# Patient Record
Sex: Female | Born: 1985
Health system: Southern US, Community
[De-identification: ages and names within clinical notes are randomized; demographics above are authoritative.]

## PROBLEM LIST (undated history)

## (undated) DIAGNOSIS — D649 Anemia, unspecified: Secondary | ICD-10-CM

## (undated) DIAGNOSIS — K219 Gastro-esophageal reflux disease without esophagitis: Secondary | ICD-10-CM

## (undated) HISTORY — PX: OTHER SURGICAL HISTORY: SHX169

---
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
## 2007-01-02 ENCOUNTER — Inpatient Hospital Stay (HOSPITAL_COMMUNITY): Admission: AD | Admit: 2007-01-02 | Discharge: 2007-01-04 | Payer: Self-pay | Admitting: Obstetrics and Gynecology

## 2009-07-18 ENCOUNTER — Inpatient Hospital Stay (HOSPITAL_COMMUNITY): Admission: AD | Admit: 2009-07-18 | Discharge: 2009-07-20 | Payer: Self-pay | Admitting: Obstetrics and Gynecology

## 2009-07-18 ENCOUNTER — Inpatient Hospital Stay (HOSPITAL_COMMUNITY): Admission: AD | Admit: 2009-07-18 | Discharge: 2009-07-18 | Payer: Self-pay | Admitting: Obstetrics and Gynecology

## 2009-11-01 HISTORY — PX: KNEE SURGERY: SHX244

## 2011-02-05 LAB — CBC
HCT: 40.5 % (ref 36.0–46.0)
Hemoglobin: 11.8 g/dL — ABNORMAL LOW (ref 12.0–15.0)
Hemoglobin: 13.8 g/dL (ref 12.0–15.0)
MCHC: 34.1 g/dL (ref 30.0–36.0)
MCHC: 34.5 g/dL (ref 30.0–36.0)
MCV: 92.4 fL (ref 78.0–100.0)
Platelets: 138 10*3/uL — ABNORMAL LOW (ref 150–400)
RDW: 13.1 % (ref 11.5–15.5)
WBC: 12.8 10*3/uL — ABNORMAL HIGH (ref 4.0–10.5)

## 2011-03-19 NOTE — Discharge Summary (Signed)
Michele Houston, Michele Houston               ACCOUNT NO.:  [id number]   MEDICAL RECORD NO.:  [id number]          PATIENT TYPE:  INP   LOCATION:  9141                          FACILITY:  [facility]   PHYSICIAN:  Huel Cote, M.D. DATE OF BIRTH:  29-Nov-1985   DATE OF ADMISSION:  01/02/2007  DATE OF DISCHARGE:  01/04/2007                               DISCHARGE SUMMARY   DISCHARGE DIAGNOSES:  1. Term pregnancy at 37 weeks, delivered.  2. Status post normal spontaneous vaginal delivery.   DISCHARGE MEDICATIONS:  1. Motrin 600 mg p.o. every 6 hours.  2. Prenatal 1 p.o. daily.   DISCHARGE FOLLOWUP:  The patient is to follow up in the office in 6  weeks for routine postpartum exam.   HOSPITAL COURSE:  The patient is an 25 year old, G1, P0, who was  admitted at [redacted] weeks gestation with regular contractions and bloody  show. The patient had been seen multiple times over the 4 days prior to  admission with contractions and minimal cervical change; however, upon  this admission, she did reach 4-5 cm with regular contractions.  She was  admitted and received her epidural and continued to progress in labor  with Pitocin augmentation.  Prenatal care otherwise had been  uncomplicated.   PRENATAL LABORATORY DATA:  A+, antibody negative, RPR nonreactive,  rubella equivocal, hepatitis B surface antigen negative, HIV negative,  GC negative, chlamydia negative, 1-hour Glucola 146, 3-hour within  normal limits.   PAST MEDICAL HISTORY:  Dysthymia.   PAST SURGICAL HISTORY:  Wisdom tooth   ALLERGIES:  CECLOR.   MEDICATIONS:  None.   HOSPITAL COURSE:  On admission, she was afebrile with stable vital  signs.  Fetal heart rate was reactive.  Cervix after admission was found  to be 9 cm, completely effaced and zero station.  She had rupture of  membranes performed with clear fluid noted, and she was not placed on  group B strep prophylaxis as she had no risk factors, though her status  was unknown and  progressed very quickly, so did not ever need any  antibiotic prophylaxis.  She reached complete dilation and pushed well  with a normal spontaneous vaginal delivery of a vigorous female infant  over a small second-degree laceration.  Apgars were 9/9. Weight was 6  pounds 1 ounce.  Placenta delivered spontaneously and was handed off for  cord blood donation.  Estimated blood loss was 350 mL. Second-degree  laceration was repaired 2-0 Vicryl.  Cervix and rectum were intact.   She did well postpartum.  On postpartum day #1, her hemoglobin was 11.7.  By postpartum day #2, she was feeling quite well and had no complaints.  Pain was well controlled.  Fundus was firm and bleeding normal, and she  was felt stable for discharge home.      Huel Cote, M.D.  Electronically Signed     KR/MEDQ  D:  01/04/2007  T:  01/04/2007  Job:  [id number]

## 2013-11-25 ENCOUNTER — Emergency Department: Payer: Self-pay | Admitting: Emergency Medicine

## 2014-03-28 ENCOUNTER — Inpatient Hospital Stay: Payer: Self-pay | Admitting: Obstetrics & Gynecology

## 2014-03-29 LAB — CBC WITH DIFFERENTIAL/PLATELET
BASOS ABS: 0 10*3/uL (ref 0.0–0.1)
BASOS PCT: 0.1 %
EOS ABS: 0 10*3/uL (ref 0.0–0.7)
Eosinophil %: 0.3 %
HCT: 35.9 % (ref 35.0–47.0)
HGB: 12 g/dL (ref 12.0–16.0)
Lymphocyte #: 1.3 10*3/uL (ref 1.0–3.6)
Lymphocyte %: 12.6 %
MCH: 29.3 pg (ref 26.0–34.0)
MCHC: 33.4 g/dL (ref 32.0–36.0)
MCV: 88 fL (ref 80–100)
Monocyte #: 0.5 x10 3/mm (ref 0.2–0.9)
Monocyte %: 5.4 %
Neutrophil #: 8.3 10*3/uL — ABNORMAL HIGH (ref 1.4–6.5)
Neutrophil %: 81.6 %
Platelet: 180 10*3/uL (ref 150–440)
RBC: 4.1 10*6/uL (ref 3.80–5.20)
RDW: 14 % (ref 11.5–14.5)
WBC: 10.2 10*3/uL (ref 3.6–11.0)

## 2014-03-30 LAB — HEMATOCRIT: HCT: 34.8 % — ABNORMAL LOW (ref 35.0–47.0)

## 2015-03-11 NOTE — H&P (Signed)
L&D Evaluation:  History Expanded:  HPI 29yo [id number] who presnts in labor with contractions every 4-7 minutes. She has had two SVD beofre and has had fairly rapid-7 hour labors where the contractions were not close but change was made. SHe is GBS pos. She made change from 1-3 at office and now as gone to 4. She will be kept and AMp started. AROm after second dose. HIstory of PPD, POS antiM antibody, not present at 37 degrees. Resolved low lying placenta False pos RPR with TPA neg repeat at 36 weeks is neg. VI/RI/Apos tDAP on 4/2, BF desires minipill.   Gravida 3   Term 2   PreTerm 0   Abortion 0   Living 2   Blood Type (Maternal) A positive   Group B Strep Results Maternal (Result >5wks must be treated as unknown) positive    Maternal HIV Negative   Maternal Syphilis Ab Nonreactive   Maternal Varicella Immune   Rubella Results (Maternal) immune   Maternal T-Dap Immune   [facility]   Presents with contractions   Patient's Medical History No Chronic Illness    Patient's Surgical History none    Medications Pre Natal Vitamins    Allergies NKDA   Social History none    Family History Non-Contributory    ROS:  ROS All systems were reviewed.  HEENT, CNS, GI, GU, Respiratory, CV, Renal and Musculoskeletal systems were found to be normal.   Exam:  Vital Signs stable    Urine Protein not completed   General no apparent distress   Mental Status clear    Chest clear    Heart normal sinus rhythm   Abdomen gravid, tender with contractions   Estimated Fetal Weight Average for gestational age   Fetal Position v   Fundal Height term   Back no CVAT   Edema no edema    Pelvic no external lesions   Mebranes Intact   FHT normal rate with no decels, CAT 1   Ucx irregular   Ucx Frequency 4 min   Skin dry   Lymph no lymphadenopathy    Impression:  Impression active labor   Plan:  Plan antibiotics for GBBS prophylaxis   Comments AROM when  after two doses   Follow Up Appointment need to schedule. in 6 weeks   Electronic Signatures: Adria DevonKlett, Baylen Buckner (MD)  (Signed 29-May-15 08:38)  Authored: L&D Evaluation   Last Updated: 29-May-15 08:38 by Adria DevonKlett, Kyal Arts (MD)

## 2015-05-25 ENCOUNTER — Emergency Department
Admission: EM | Admit: 2015-05-25 | Discharge: 2015-05-25 | Disposition: A | Discharge status: Home / Self Care | Payer: BLUE CROSS/BLUE SHIELD | Attending: Emergency Medicine | Admitting: Emergency Medicine

## 2015-05-25 ENCOUNTER — Emergency Department: Payer: BLUE CROSS/BLUE SHIELD

## 2015-05-25 ENCOUNTER — Other Ambulatory Visit: Payer: Self-pay

## 2015-05-25 ENCOUNTER — Encounter: Payer: Self-pay | Admitting: Emergency Medicine

## 2015-05-25 DIAGNOSIS — R1011 Right upper quadrant pain: Secondary | ICD-10-CM

## 2015-05-25 DIAGNOSIS — R1010 Upper abdominal pain, unspecified: Secondary | ICD-10-CM | POA: Diagnosis present

## 2015-05-25 DIAGNOSIS — K279 Peptic ulcer, site unspecified, unspecified as acute or chronic, without hemorrhage or perforation: Secondary | ICD-10-CM | POA: Insufficient documentation

## 2015-05-25 LAB — COMPREHENSIVE METABOLIC PANEL
ALT: 21 U/L (ref 14–54)
ANION GAP: 12 (ref 5–15)
AST: 22 U/L (ref 15–41)
Albumin: 4.7 g/dL (ref 3.5–5.0)
Alkaline Phosphatase: 56 U/L (ref 38–126)
BILIRUBIN TOTAL: 0.6 mg/dL (ref 0.3–1.2)
BUN: 13 mg/dL (ref 6–20)
CALCIUM: 9.3 mg/dL (ref 8.9–10.3)
CO2: 24 mmol/L (ref 22–32)
CREATININE: 0.93 mg/dL (ref 0.44–1.00)
Chloride: 104 mmol/L (ref 101–111)
GLUCOSE: 89 mg/dL (ref 65–99)
POTASSIUM: 3.5 mmol/L (ref 3.5–5.1)
Sodium: 140 mmol/L (ref 135–145)
TOTAL PROTEIN: 7.2 g/dL (ref 6.5–8.1)

## 2015-05-25 LAB — URINALYSIS COMPLETE WITH MICROSCOPIC (ARMC ONLY)
BACTERIA UA: NONE SEEN
Bilirubin Urine: NEGATIVE
Glucose, UA: NEGATIVE mg/dL
HGB URINE DIPSTICK: NEGATIVE
Leukocytes, UA: NEGATIVE
NITRITE: NEGATIVE
Protein, ur: NEGATIVE mg/dL
SPECIFIC GRAVITY, URINE: 1.021 (ref 1.005–1.030)
pH: 8 (ref 5.0–8.0)

## 2015-05-25 LAB — CBC
HEMATOCRIT: 41.9 % (ref 35.0–47.0)
Hemoglobin: 14.1 g/dL (ref 12.0–16.0)
MCH: 28.6 pg (ref 26.0–34.0)
MCHC: 33.6 g/dL (ref 32.0–36.0)
MCV: 85.1 fL (ref 80.0–100.0)
PLATELETS: 267 10*3/uL (ref 150–440)
RBC: 4.93 MIL/uL (ref 3.80–5.20)
RDW: 13.7 % (ref 11.5–14.5)
WBC: 6.6 10*3/uL (ref 3.6–11.0)

## 2015-05-25 LAB — LIPASE, BLOOD: LIPASE: 24 U/L (ref 22–51)

## 2015-05-25 MED ORDER — SUCRALFATE 1 G PO TABS
1.0000 g | ORAL_TABLET | Freq: Once | ORAL | Status: AC
  Administered 2015-05-25: 1 g via ORAL
  Filled 2015-05-25: qty 1

## 2015-05-25 MED ORDER — GI COCKTAIL ~~LOC~~
30.0000 mL | Freq: Once | ORAL | Status: AC
  Administered 2015-05-25: 30 mL via ORAL
  Filled 2015-05-25: qty 30

## 2015-05-25 MED ORDER — PANTOPRAZOLE SODIUM 40 MG PO TBEC: 40.0000 mg | DELAYED_RELEASE_TABLET | Freq: Every day | ORAL | Status: DC

## 2015-05-25 MED ORDER — SUCRALFATE 1 G PO TABS: 1.0000 g | ORAL_TABLET | Freq: Four times a day (QID) | ORAL | Status: DC

## 2015-05-25 MED ORDER — PANTOPRAZOLE SODIUM 40 MG PO TBEC
40.0000 mg | DELAYED_RELEASE_TABLET | Freq: Once | ORAL | Status: AC
  Administered 2015-05-25: 40 mg via ORAL
  Filled 2015-05-25: qty 1

## 2015-05-25 NOTE — ED Notes (Addendum)
Pt here RUQ abdominal pain.  PT was seen at [facility] ED on Wednesday and states the MD felt that she may have gallstones. Pt here today for increased pain. Pt wanting ultrasound.  Pt also reports feeling some intermittent chest pain and difficulty taking a breath.

## 2015-05-25 NOTE — Discharge Instructions (Signed)
Peptic Ulcer A peptic ulcer is a sore in the lining of your esophagus (esophageal ulcer), stomach (gastric ulcer), or in the first part of your small intestine (duodenal ulcer). The ulcer causes erosion into the deeper tissue. CAUSES  Normally, the lining of the stomach and the small intestine protects itself from the acid that digests food. The protective lining can be damaged by:  An infection caused by a bacterium called Helicobacter pylori (H. pylori).  Regular use of nonsteroidal anti-inflammatory drugs (NSAIDs), such as ibuprofen or aspirin.  Smoking tobacco. Other risk factors include being older than 50, drinking alcohol excessively, and having a family history of ulcer disease.  SYMPTOMS   Burning pain or gnawing in the area between the chest and the belly button.  Heartburn.  Nausea and vomiting.  Bloating. The pain can be worse on an empty stomach and at night. If the ulcer results in bleeding, it can cause:  Black, tarry stools.  Vomiting of bright red blood.  Vomiting of coffee-ground-looking materials. DIAGNOSIS  A diagnosis is usually made based upon your history and an exam. Other tests and procedures may be performed to find the cause of the ulcer. Finding a cause will help determine the best treatment. Tests and procedures may include:  Blood tests, stool tests, or breath tests to check for the bacterium H. pylori.  An upper gastrointestinal (GI) series of the esophagus, stomach, and small intestine.  An endoscopy to examine the esophagus, stomach, and small intestine.  A biopsy. TREATMENT  Treatment may include:  Eliminating the cause of the ulcer, such as smoking, NSAIDs, or alcohol.  Medicines to reduce the amount of acid in your digestive tract.  Antibiotic medicines if the ulcer is caused by the H. pylori bacterium.  An upper endoscopy to treat a bleeding ulcer.  Surgery if the bleeding is severe or if the ulcer created a hole somewhere in the  digestive system. HOME CARE INSTRUCTIONS   Avoid tobacco, alcohol, and caffeine. Smoking can increase the acid in the stomach, and continued smoking will impair the healing of ulcers.  Avoid foods and drinks that seem to cause discomfort or aggravate your ulcer.  Only take medicines as directed by your caregiver. Do not substitute over-the-counter medicines for prescription medicines without talking to your caregiver.  Keep any follow-up appointments and tests as directed. SEEK MEDICAL CARE IF:   Your do not improve within 7 days of starting treatment.  You have ongoing indigestion or heartburn. SEEK IMMEDIATE MEDICAL CARE IF:   You have sudden, sharp, or persistent abdominal pain.  You have bloody or dark black, tarry stools.  You vomit blood or vomit that looks like coffee grounds.  You become light-headed, weak, or feel faint.  You become sweaty or clammy. MAKE SURE YOU:   Understand these instructions.  Will watch your condition.  Will get help right away if you are not doing well or get worse. Document Released: 10/15/2000 Document Revised: 03/04/2014 Document Reviewed: 05/17/2012 [facility] Patient Information 2015 [place], [place]. This information is not intended to replace advice given to you by your health care provider. Make sure you discuss any questions you have with your health care provider. Heartburn Heartburn is a painful, burning sensation in the chest. It may feel worse in certain positions, such as lying down or bending over. It is caused by stomach acid backing up into the tube that carries food from the mouth down to the stomach (lower esophagus).  CAUSES   Large meals.  Certain foods and drinks.  Exercise.  Increased acid production.  Being overweight or obese.  Certain medicines. SYMPTOMS   Burning pain in the chest or lower throat.  Bitter taste in the mouth.  Coughing. DIAGNOSIS  If the usual treatments for heartburn do not improve your  symptoms, then tests may be done to see if there is another condition present. Possible tests may include:  X-rays.  Endoscopy. This is when a tube with a light and a camera on the end is used to examine the esophagus and the stomach.  A test to measure the amount of acid in the esophagus (pH test).  A test to see if the esophagus is working properly (esophageal manometry).  Blood, breath, or stool tests to check for bacteria that cause ulcers. TREATMENT   Your caregiver may tell you to use certain over-the-counter medicines (antacids, acid reducers) for mild heartburn.  Your caregiver may prescribe medicines to decrease the acid in your stomach or protect your stomach lining.  Your caregiver may recommend certain diet changes.  For severe cases, your caregiver may recommend that the head of your bed be elevated on blocks. (Sleeping with more pillows is not an effective treatment as it only changes the position of your head and does not improve the main problem of stomach acid refluxing into the esophagus.) HOME CARE INSTRUCTIONS   Take all medicines as directed by your caregiver.  Raise the head of your bed by putting blocks under the legs if instructed to by your caregiver.  Do not exercise right after eating.  Avoid eating 2 or 3 hours before bed. Do not lie down right after eating.  Eat small meals throughout the day instead of 3 large meals.  Stop smoking if you smoke.  Maintain a healthy weight.  Identify foods and beverages that make your symptoms worse and avoid them. Foods you may want to avoid include:  Peppers.  Chocolate.  High-fat foods, including fried foods.  Spicy foods.  Garlic and onions.  Citrus fruits, including oranges, grapefruit, lemons, and limes.  Food containing tomatoes or tomato products.  Mint.  Carbonated drinks, caffeinated drinks, and alcohol.  Vinegar. SEEK IMMEDIATE MEDICAL CARE IF:  You have severe chest pain that goes down  your arm or into your jaw or neck.  You feel sweaty, dizzy, or lightheaded.  You are short of breath.  You vomit blood.  You have difficulty or pain with swallowing.  You have bloody or black, tarry stools.  You have episodes of heartburn more than 3 times a week for more than 2 weeks. MAKE SURE YOU:  Understand these instructions.  Will watch your condition.  Will get help right away if you are not doing well or get worse. Document Released: 03/06/2009 Document Revised: 01/10/2012 Document Reviewed: 04/04/2011 [facility] Patient Information 2015 [place], [place]. This information is not intended to replace advice given to you by your health care provider. Make sure you discuss any questions you have with your health care provider.

## 2015-05-25 NOTE — ED Notes (Signed)
Patient describes increased pain when eating in the RUQ, R Scapula/Shoulder, and R Axilla, pain with palpation to RUQ. Patient states that she is "more bloated than usual"

## 2015-05-25 NOTE — ED Provider Notes (Signed)
[facility] Emergency Department Provider Note     Time seen: ----------------------------------------- 5:45 PM on 05/25/2015 -----------------------------------------    I have reviewed the triage vital signs and the nursing notes.    HISTORY  Chief Complaint Abdominal Pain    HPI Michele Houston is a 29 y.o. female who presents ER for upper abdominal pain. According to reports she was seen at outside ER on Wednesday states they felt she may have gallstones. She is here for increased pain, wanting an ultrasound.At the outside ER she could not get an ultrasound the telephone for visit. She does report to worst pain whenever she eats, sharp and stabbing. Nothing makes it better.   No past medical history on file.  There are no active problems to display for this patient.   History reviewed. No pertinent past surgical history.  Allergies Ceclor  Social History History  Substance Use Topics  . Smoking status: Never Smoker   . Smokeless tobacco: Not on file  . Alcohol Use: Not on file    Review of Systems Constitutional: Negative for fever. Eyes: Negative for visual changes. ENT: Negative for sore throat. Cardiovascular: Negative for chest pain. Respiratory: Negative for shortness of breath. Gastrointestinal: Positive for abdominal pain, vomiting Genitourinary: Negative for dysuria. Musculoskeletal: Negative for back pain. Skin: Negative for rash. Neurological: Negative for headaches, focal weakness or numbness.  10-point ROS otherwise negative.  ____________________________________________   PHYSICAL EXAM:  VITAL SIGNS: ED Triage Vitals  Enc Vitals Group     BP 05/25/15 1536 143/76 mmHg     Pulse Rate 05/25/15 1536 104     Resp 05/25/15 1536 16     Temp 05/25/15 1536 98.7 F (37.1 C)     Temp Source 05/25/15 1536 Oral     SpO2 05/25/15 1536 98 %     Weight 05/25/15 1540 182 lb (82.555 kg)     Height 05/25/15 1540 5\' 7"   (1.702 m)     Head Cir --      Peak Flow --      Pain Score 05/25/15 1536 5     Pain Loc --      Pain Edu? --      Excl. in GC? --     Constitutional: Alert and oriented. Well appearing and in no distress. Eyes: Conjunctivae are normal. PERRL. Normal extraocular movements. ENT   Head: Normocephalic and atraumatic.   Nose: No congestion/rhinnorhea.   Mouth/Throat: Mucous membranes are moist.   Neck: No stridor. Hematological/Lymphatic/Immunilogical: No cervical lymphadenopathy. Cardiovascular: Normal rate, regular rhythm. Normal and symmetric distal pulses are present in all extremities. No murmurs, rubs, or gallops. Respiratory: Normal respiratory effort without tachypnea nor retractions. Breath sounds are clear and equal bilaterally. No wheezes/rales/rhonchi. Gastrointestinal: Positive for epigastric tenderness, no rebound or guarding. No right upper quadrant tenderness. Musculoskeletal: Nontender with normal range of motion in all extremities. No joint effusions.  No lower extremity tenderness nor edema. Neurologic:  Normal speech and language. No gross focal neurologic deficits are appreciated. Speech is normal. No gait instability. Skin:  Skin is warm, dry and intact. No rash noted. Psychiatric: Mood and affect are normal. Speech and behavior are normal. Patient exhibits appropriate insight and judgment.  ____________________________________________  ED COURSE:  Pertinent labs & imaging results that were available during my care of the patient were reviewed by me and considered in my medical decision making (see chart for details). Patient will need abdominal labs, ultrasound. ____________________________________________    LABS (pertinent positives/negatives)  Labs Reviewed  LIPASE, BLOOD  COMPREHENSIVE METABOLIC PANEL  CBC  URINALYSIS COMPLETEWITH MICROSCOPIC ([facility] ONLY)    RADIOLOGY Images were viewed by me  Right upper Quadrant ultrasound is  unremarkable.  ____________________________________________  FINAL ASSESSMENT AND PLAN  Epigastric pain, likely peptic ulcer disease  Plan: Patient with labs and imaging as dictated above. Patient likely with peptic ulcer disease, ultrasound and labs are unremarkable. I advise her to follow-up with gastroenterology, will start on Carafate, Protonix and a GI cocktail was given here. She is advised to continue taking her Zantac until the Protonix becomes effective. Advised if her symptoms did not improve she will need a HIDA scan as well. Patient is agreeable to plan, stable for outpatient follow-up.   Emily Filbert, MD   Emily Filbert, MD 05/25/15 [id number]

## 2015-05-26 ENCOUNTER — Other Ambulatory Visit: Payer: Self-pay | Admitting: Gastroenterology

## 2015-05-26 DIAGNOSIS — R110 Nausea: Secondary | ICD-10-CM

## 2015-05-26 DIAGNOSIS — R1011 Right upper quadrant pain: Secondary | ICD-10-CM

## 2015-05-26 DIAGNOSIS — M25511 Pain in right shoulder: Secondary | ICD-10-CM

## 2015-05-27 DIAGNOSIS — K219 Gastro-esophageal reflux disease without esophagitis: Secondary | ICD-10-CM | POA: Diagnosis not present

## 2015-05-27 DIAGNOSIS — Z881 Allergy status to other antibiotic agents status: Secondary | ICD-10-CM | POA: Diagnosis not present

## 2015-05-27 DIAGNOSIS — Z87891 Personal history of nicotine dependence: Secondary | ICD-10-CM | POA: Diagnosis not present

## 2015-05-27 DIAGNOSIS — Z79899 Other long term (current) drug therapy: Secondary | ICD-10-CM | POA: Diagnosis not present

## 2015-05-27 DIAGNOSIS — K811 Chronic cholecystitis: Secondary | ICD-10-CM | POA: Diagnosis present

## 2015-05-28 ENCOUNTER — Ambulatory Visit (INDEPENDENT_AMBULATORY_CARE_PROVIDER_SITE_OTHER): Payer: BLUE CROSS/BLUE SHIELD | Admitting: General Surgery

## 2015-05-28 ENCOUNTER — Ambulatory Visit
Admission: RE | Admit: 2015-05-28 | Discharge: 2015-05-28 | Disposition: A | Discharge status: Home / Self Care | Payer: BLUE CROSS/BLUE SHIELD | Source: Ambulatory Visit | Attending: Gastroenterology | Admitting: Gastroenterology

## 2015-05-28 ENCOUNTER — Encounter: Payer: Self-pay | Admitting: General Surgery

## 2015-05-28 VITALS — BP 118/80 | HR 84 | Resp 12 | Ht 67.0 in | Wt 179.0 lb

## 2015-05-28 DIAGNOSIS — R1011 Right upper quadrant pain: Secondary | ICD-10-CM

## 2015-05-28 DIAGNOSIS — M25511 Pain in right shoulder: Secondary | ICD-10-CM | POA: Diagnosis present

## 2015-05-28 DIAGNOSIS — R110 Nausea: Secondary | ICD-10-CM | POA: Diagnosis present

## 2015-05-28 MED ORDER — SINCALIDE 5 MCG IJ SOLR
0.0200 ug/kg | Freq: Once | INTRAMUSCULAR | Status: AC
  Administered 2015-05-28: 1.65 ug via INTRAVENOUS

## 2015-05-28 MED ORDER — TECHNETIUM TC 99M MEBROFENIN IV KIT
5.0000 | PACK | Freq: Once | INTRAVENOUS | Status: AC | PRN
  Administered 2015-05-28: 5.09 via INTRAVENOUS

## 2015-05-28 NOTE — Patient Instructions (Addendum)

## 2015-05-28 NOTE — Progress Notes (Signed)
Patient ID: Michele Houston, female   DOB: September 16, 1986, 29 y.o.   MRN: [id number]  Chief Complaint  Patient presents with  . Abdominal Pain    HPI Michele Houston is a 29 y.o. female.  Here today for evaluation of abdominal pain. She has been having abdominal pain for about a week. The pain was originally in the epigastric area, and now radiates into the right upper quadrant and into the right subscapular region. Fluid is a triggering event. A meal as planned as oatmeal can precipitate symptoms. She has no difficulty with liquids. She was seen in the [facility] emergency room about a week ago and laboratory studies including a CBC incompetence of metabolic panel were normal. She had recurrent pain and was seen in the [facility] emergency room 3 days ago at which time an ultrasound was negative and laboratory studies were again normal. The pain has persisted. She's make use of contacts, Zantac and Carafate with minimal improvement. Her case was reviewed by Dow Adolph, M.D. and he arranged for the patient have a HIDA scan which was completed earlier today. The ejection fraction was normal at 60% at the CCK injection did reproduce her pain.   She states the sharp stibbibg pain last for about 2 to 4 hours.   The first episode occurred in the early morning hours. She reports no change in bowel function.  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.  She reported today that she had mild similar symptoms after her second child 6 years ago, but they were fairly mild and very intermittent. No similar difficulty after her most recent child 2 years ago.  The patient was last seen here in June 2012 when she was treated for a perirectal abscess.   HPI  No past medical history on file.  Past Surgical History  Procedure Laterality Date  . Tubes in ears  baby  . Knee surgery Left 2011    No family history on file.  Social History History  Substance Use Topics  . Smoking status: Former Smoker  -- 1.00 packs/day for 4 years    Types: Cigarettes  . Smokeless tobacco: Not on file  . Alcohol Use: No    Allergies  Allergen Reactions  . Ceclor [Cefaclor] Hives    Current Outpatient Prescriptions  Medication Sig Dispense Refill  . cetirizine (ZYRTEC) 5 MG tablet Take 10 mg by mouth.    . oxyCODONE-acetaminophen (PERCOCET/ROXICET) 5-325 MG per tablet Take by mouth.    . pantoprazole (PROTONIX) 40 MG tablet Take 1 tablet (40 mg total) by mouth daily. 30 tablet 1  . ranitidine (ZANTAC) 150 MG capsule Take 150 mg by mouth.    . sucralfate (CARAFATE) 1 G tablet Take 1 tablet (1 g total) by mouth 4 (four) times daily. 120 tablet 1   No current facility-administered medications for this visit.    Review of Systems Review of Systems  Constitutional: Negative.   Respiratory: Negative.   Cardiovascular: Negative.   Gastrointestinal: Positive for abdominal pain. Negative for vomiting, diarrhea, blood in stool, anal bleeding and rectal pain.    Blood pressure 118/80, pulse 84, resp. rate 12, height  (1.702 m), weight 179 lb (81.194 kg).  Weight in 2012 was 154 pounds.  Physical Exam Physical Exam  Constitutional: She is oriented to person, place, and time. She appears well-developed and well-nourished.  Eyes: Conjunctivae are normal. No scleral icterus.  Cardiovascular: Normal rate, regular rhythm and normal heart sounds.  Pulmonary/Chest: Effort normal and breath sounds normal.  Abdominal: Soft. Normal appearance and bowel sounds are normal. There is no hepatomegaly. No hernia.    Lymphadenopathy:    She has no cervical adenopathy.  Neurological: She is alert and oriented to person, place, and time.  Skin: Skin is warm.    Data Reviewed The laboratory studies from her 2 emergency room visits as well as the abdominal ultrasound and HIDA scan were reviewed personally.  Assessment    Symptoms suggestive of biliary colic without radiologic confirmation.    Plan     The patient has a very brief period of illness and before proceeding to cholecystectomy with the hope that I will relief her symptoms I encouraged her to make use of a clear liquid diet for 48 hours, make use of Bentyl 20 mg 4 times a day and make use of Levsin SL if she has an acute episode of pain. If she does not show significant improvement over the weekend will proceed to cholecystectomy.   Laparoscopic Cholecystectomy with Intraoperative Cholangiogram. The procedure, including it's potential risks and complications (including but not limited to infection, bleeding, injury to intra-abdominal organs or bile ducts, bile leak, poor cosmetic result, sepsis and death) were discussed with the patient in detail. Non-operative options, including their inherent risks (acute calculous cholecystitis with possible choledocholithiasis or gallstone pancreatitis, with the risk of ascending cholangitis, sepsis, and death) were discussed as well. The patient expressed and understanding of what we discussed and wishes to proceed with laparoscopic cholecystectomy. The patient further understands that if it is technically not possible, or it is unsafe to proceed laparoscopically, that I will convert to an open cholecystectomy.    Patient's surgery has been scheduled for 06-03-15 at [facility].     PCP:  Verda Cumins 05/29/2015, 7:32 PM

## 2015-05-29 ENCOUNTER — Encounter: Payer: Self-pay | Admitting: General Surgery

## 2015-05-29 DIAGNOSIS — R1011 Right upper quadrant pain: Secondary | ICD-10-CM | POA: Insufficient documentation

## 2015-05-29 NOTE — H&P (Signed)
Patient ID: Michele Houston, female   DOB: 1986/02/08, 29 y.o.   MRN: [id number]  Chief Complaint  Patient presents with  . Abdominal Pain    HPI Michele Houston is a 29 y.o. female.  Here today for evaluation of abdominal pain. She has been having abdominal pain for about a week. The pain was originally in the epigastric area, and now radiates into the right upper quadrant and into the right subscapular region. Fluid is a triggering event. A meal as planned as oatmeal can precipitate symptoms. She has no difficulty with liquids. She was seen in the [facility] emergency room about a week ago and laboratory studies including a CBC incompetence of metabolic panel were normal. She had recurrent pain and was seen in the [facility] emergency room 3 days ago at which time an ultrasound was negative and laboratory studies were again normal. The pain has persisted. She's make use of contacts, Zantac and Carafate with minimal improvement. Her case was reviewed by Dow Adolph, M.D. and he arranged for the patient have a HIDA scan which was completed earlier today. The ejection fraction was normal at 60% at the CCK injection did reproduce her pain.   She states the sharp stibbibg pain last for about 2 to 4 hours.   The first episode occurred in the early morning hours. She reports no change in bowel function.  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  She reported today that she had mild similar symptoms after her second child 6 years ago, but they were fairly mild and very intermittent. No similar difficulty after her most recent child 2 years ago.  The patient was last seen here in June 2012 when she was treated for a perirectal abscess.   HPI  No past medical history on file.  Past Surgical History  Procedure Laterality Date  . Tubes in ears  baby  . Knee surgery Left 2011    No family history on file.  Social History History  Substance Use Topics  . Smoking status: Former Smoker  -- 1.00 packs/day for 4 years    Types: Cigarettes  . Smokeless tobacco: Not on file  . Alcohol Use: No    Allergies  Allergen Reactions  . Ceclor [Cefaclor] Hives    Current Outpatient Prescriptions  Medication Sig Dispense Refill  . cetirizine (ZYRTEC) 5 MG tablet Take 10 mg by mouth.    . oxyCODONE-acetaminophen (PERCOCET/ROXICET) 5-325 MG per tablet Take by mouth.    . pantoprazole (PROTONIX) 40 MG tablet Take 1 tablet (40 mg total) by mouth daily. 30 tablet 1  . ranitidine (ZANTAC) 150 MG capsule Take 150 mg by mouth.    . sucralfate (CARAFATE) 1 G tablet Take 1 tablet (1 g total) by mouth 4 (four) times daily. 120 tablet 1   No current facility-administered medications for this visit.    Review of Systems Review of Systems  Constitutional: Negative.   Respiratory: Negative.   Cardiovascular: Negative.   Gastrointestinal: Positive for abdominal pain. Negative for vomiting, diarrhea, blood in stool, anal bleeding and rectal pain.    Blood pressure 118/80, pulse 84, resp. rate 12, height  (1.702 m), weight 179 lb (81.194 kg).  Weight in 2012 was 154 pounds.  Physical Exam Physical Exam  Constitutional: She is oriented to person, place, and time. She appears well-developed and well-nourished.  Eyes: Conjunctivae are normal. No scleral icterus.  Cardiovascular: Normal rate, regular rhythm and normal heart sounds.  Pulmonary/Chest: Effort normal and breath sounds normal.  Abdominal: Soft. Normal appearance and bowel sounds are normal. There is no hepatomegaly. No hernia.    Lymphadenopathy:    She has no cervical adenopathy.  Neurological: She is alert and oriented to person, place, and time.  Skin: Skin is warm.    Data Reviewed The laboratory studies from her 2 emergency room visits as well as the abdominal ultrasound and HIDA scan were reviewed personally.  Assessment    Symptoms suggestive of biliary colic without radiologic confirmation.    Plan      The patient has a very brief period of illness and before proceeding to cholecystectomy with the hope that I will relief her symptoms I encouraged her to make use of a clear liquid diet for 48 hours, make use of Bentyl 20 mg 4 times a day and make use of Levsin SL if she has an acute episode of pain. If she does not show significant improvement over the weekend will proceed to cholecystectomy.   Laparoscopic Cholecystectomy with Intraoperative Cholangiogram. The procedure, including it's potential risks and complications (including but not limited to infection, bleeding, injury to intra-abdominal organs or bile ducts, bile leak, poor cosmetic result, sepsis and death) were discussed with the patient in detail. Non-operative options, including their inherent risks (acute calculous cholecystitis with possible choledocholithiasis or gallstone pancreatitis, with the risk of ascending cholangitis, sepsis, and death) were discussed as well. The patient expressed and understanding of what we discussed and wishes to proceed with laparoscopic cholecystectomy. The patient further understands that if it is technically not possible, or it is unsafe to proceed laparoscopically, that I will convert to an open cholecystectomy.    Patient's surgery has been scheduled for 06-03-15 at [facility].     PCP:  Davene Costain, Merrily Pew

## 2015-05-30 ENCOUNTER — Encounter: Payer: Self-pay | Admitting: *Deleted

## 2015-05-30 ENCOUNTER — Inpatient Hospital Stay: Admission: RE | Admit: 2015-05-30 | Payer: BLUE CROSS/BLUE SHIELD | Source: Ambulatory Visit

## 2015-05-30 NOTE — Patient Instructions (Signed)
  Your procedure is scheduled on: 06-03-15 Report to [facility] SAME [facility] 2ND FLOOR To find out your arrival time please call [phone] between 1PM - 3PM on 06-02-15  Remember: Instructions that are not followed completely may result in serious medical risk, up to and including death, or upon the discretion of your surgeon and anesthesiologist your surgery may need to be rescheduled.    _X___ 1. Do not eat food or drink liquids after midnight. No gum chewing or hard candies.     _X___ 2. No Alcohol for 24 hours before or after surgery.   ____ 3. Bring all medications with you on the day of surgery if instructed.    ____ 4. Notify your doctor if there is any change in your medical condition     (cold, fever, infections).     Do not wear jewelry, make-up, hairpins, clips or nail polish.  Do not wear lotions, powders, or perfumes. You may wear deodorant.  Do not shave 48 hours prior to surgery. Men may shave face and neck.  Do not bring valuables to the hospital.    [facility] is not responsible for any belongings or valuables.               Contacts, dentures or bridgework may not be worn into surgery.  Leave your suitcase in the car. After surgery it may be brought to your room.  For patients admitted to the hospital, discharge time is determined by your treatment team.   Patients discharged the day of surgery will not be allowed to drive home.   Please read over the following fact sheets that you were given:    _X___ Take these medicines the morning of surgery with A SIP OF WATER:    1. ZANTAC  2.   3.   4.  5.  6.  ____ Fleet Enema (as directed)   ____ Use CHG Soap as directed  ____ Use inhalers on the day of surgery  ____ Stop metformin 2 days prior to surgery    ____ Take 1/2 of usual insulin dose the night before surgery and none on the morning of surgery.   ____ Stop Coumadin/Plavix/aspirin-N/A  ____ Stop Anti-inflammatories-NO NSAIDS OR ASA  PRODUCTS-PERCOCET OK   ____ Stop supplements until after surgery.    ____ Bring C-Pap to the hospital.

## 2015-06-02 ENCOUNTER — Telehealth: Payer: Self-pay | Admitting: *Deleted

## 2015-06-02 NOTE — Telephone Encounter (Signed)
Patient called for an update to let you know she was still hurting over the weekend. She will proceed with surgery as already planned.

## 2015-06-03 ENCOUNTER — Encounter
Admission: RE | Disposition: A | Discharge status: Home / Self Care | Payer: Self-pay | Source: Ambulatory Visit | Attending: General Surgery

## 2015-06-03 ENCOUNTER — Ambulatory Visit
Admission: RE | Admit: 2015-06-03 | Discharge: 2015-06-03 | Disposition: A | Discharge status: Home / Self Care | Payer: BLUE CROSS/BLUE SHIELD | Source: Ambulatory Visit | Attending: General Surgery | Admitting: General Surgery

## 2015-06-03 ENCOUNTER — Ambulatory Visit: Payer: BLUE CROSS/BLUE SHIELD | Admitting: Anesthesiology

## 2015-06-03 ENCOUNTER — Encounter: Payer: Self-pay | Admitting: *Deleted

## 2015-06-03 ENCOUNTER — Ambulatory Visit: Payer: BLUE CROSS/BLUE SHIELD

## 2015-06-03 DIAGNOSIS — K811 Chronic cholecystitis: Secondary | ICD-10-CM | POA: Diagnosis not present

## 2015-06-03 DIAGNOSIS — Z87891 Personal history of nicotine dependence: Secondary | ICD-10-CM | POA: Insufficient documentation

## 2015-06-03 DIAGNOSIS — Z79899 Other long term (current) drug therapy: Secondary | ICD-10-CM | POA: Insufficient documentation

## 2015-06-03 DIAGNOSIS — K219 Gastro-esophageal reflux disease without esophagitis: Secondary | ICD-10-CM | POA: Insufficient documentation

## 2015-06-03 DIAGNOSIS — K819 Cholecystitis, unspecified: Secondary | ICD-10-CM

## 2015-06-03 DIAGNOSIS — Z881 Allergy status to other antibiotic agents status: Secondary | ICD-10-CM | POA: Insufficient documentation

## 2015-06-03 HISTORY — DX: Gastro-esophageal reflux disease without esophagitis: K21.9

## 2015-06-03 HISTORY — PX: CHOLECYSTECTOMY: SHX55

## 2015-06-03 HISTORY — DX: Anemia, unspecified: D64.9

## 2015-06-03 SURGERY — LAPAROSCOPIC CHOLECYSTECTOMY WITH INTRAOPERATIVE CHOLANGIOGRAM
Anesthesia: General | Wound class: Clean Contaminated

## 2015-06-03 MED ORDER — ONDANSETRON HCL 4 MG/2ML IJ SOLN
INTRAMUSCULAR | Status: DC | PRN
  Administered 2015-06-03: 4 mg via INTRAVENOUS

## 2015-06-03 MED ORDER — KETOROLAC TROMETHAMINE 30 MG/ML IJ SOLN
INTRAMUSCULAR | Status: DC | PRN
  Administered 2015-06-03: 30 mg via INTRAVENOUS

## 2015-06-03 MED ORDER — DEXAMETHASONE SODIUM PHOSPHATE 4 MG/ML IJ SOLN
INTRAMUSCULAR | Status: DC | PRN
  Administered 2015-06-03: 10 mg via INTRAVENOUS

## 2015-06-03 MED ORDER — LACTATED RINGERS IV SOLN
INTRAVENOUS | Status: DC
  Administered 2015-06-03: 50 mL/h via INTRAVENOUS

## 2015-06-03 MED ORDER — ACETAMINOPHEN 10 MG/ML IV SOLN
INTRAVENOUS | Status: AC
  Filled 2015-06-03: qty 100

## 2015-06-03 MED ORDER — GLYCOPYRROLATE 0.2 MG/ML IJ SOLN
INTRAMUSCULAR | Status: DC | PRN
  Administered 2015-06-03: 0.6 mg via INTRAVENOUS

## 2015-06-03 MED ORDER — ACETAMINOPHEN 10 MG/ML IV SOLN
INTRAVENOUS | Status: DC | PRN
  Administered 2015-06-03: 1000 mg via INTRAVENOUS

## 2015-06-03 MED ORDER — ROCURONIUM BROMIDE 100 MG/10ML IV SOLN
INTRAVENOUS | Status: DC | PRN
  Administered 2015-06-03: 40 mg via INTRAVENOUS

## 2015-06-03 MED ORDER — HYDROCODONE-ACETAMINOPHEN 5-325 MG PO TABS: 1.0000 | ORAL_TABLET | ORAL | Status: DC | PRN

## 2015-06-03 MED ORDER — FENTANYL CITRATE (PF) 100 MCG/2ML IJ SOLN
INTRAMUSCULAR | Status: DC | PRN
  Administered 2015-06-03 (×2): 100 ug via INTRAVENOUS

## 2015-06-03 MED ORDER — FENTANYL CITRATE (PF) 100 MCG/2ML IJ SOLN
25.0000 ug | INTRAMUSCULAR | Status: DC | PRN
  Administered 2015-06-03 (×3): 25 ug via INTRAVENOUS

## 2015-06-03 MED ORDER — PROMETHAZINE HCL 25 MG/ML IJ SOLN
INTRAMUSCULAR | Status: AC
  Administered 2015-06-03: 6.25 mg via INTRAVENOUS
  Filled 2015-06-03: qty 1

## 2015-06-03 MED ORDER — MIDAZOLAM HCL 2 MG/2ML IJ SOLN
INTRAMUSCULAR | Status: DC | PRN
  Administered 2015-06-03: 2 mg via INTRAVENOUS

## 2015-06-03 MED ORDER — NEOSTIGMINE METHYLSULFATE 10 MG/10ML IV SOLN
INTRAVENOUS | Status: DC | PRN
  Administered 2015-06-03: 4 mg via INTRAVENOUS

## 2015-06-03 MED ORDER — PROMETHAZINE HCL 25 MG/ML IJ SOLN
6.2500 mg | INTRAMUSCULAR | Status: DC | PRN
Start: 2015-06-03 — End: 2015-06-03
  Administered 2015-06-03: 6.25 mg via INTRAVENOUS

## 2015-06-03 MED ORDER — SODIUM CHLORIDE 0.9 % IJ SOLN
INTRAMUSCULAR | Status: AC
  Filled 2015-06-03: qty 50

## 2015-06-03 MED ORDER — SODIUM CHLORIDE 0.9 % IJ SOLN
INTRAMUSCULAR | Status: AC
  Filled 2015-06-03: qty 10

## 2015-06-03 MED ORDER — PROPOFOL 10 MG/ML IV BOLUS
INTRAVENOUS | Status: DC | PRN
  Administered 2015-06-03: 150 mg via INTRAVENOUS

## 2015-06-03 MED ORDER — SODIUM CHLORIDE 0.9 % IV SOLN
INTRAVENOUS | Status: DC | PRN
  Administered 2015-06-03: 7 mL

## 2015-06-03 MED ORDER — FENTANYL CITRATE (PF) 100 MCG/2ML IJ SOLN
INTRAMUSCULAR | Status: AC
  Administered 2015-06-03: 25 ug via INTRAVENOUS
  Filled 2015-06-03: qty 2

## 2015-06-03 MED ORDER — SODIUM CHLORIDE 0.9 % IR SOLN
Status: DC | PRN
  Administered 2015-06-03: 400 mL

## 2015-06-03 SURGICAL SUPPLY — 37 items
APPLIER CLIP ROT 10 11.4 M/L (STAPLE) ×2
APR CLP MED LRG 11.4X10 (STAPLE) ×1
BLADE SURG 11 STRL SS SAFETY (MISCELLANEOUS) ×2 IMPLANT
CANISTER SUCT 1200ML W/VALVE (MISCELLANEOUS) ×2 IMPLANT
CANNULA DILATOR 10 W/SLV (CANNULA) ×2 IMPLANT
CANNULA DILATOR 5 W/SLV (CANNULA) ×4 IMPLANT
CATH CHOLANG 76X19 KUMAR (CATHETERS) ×2 IMPLANT
CHLORAPREP W/TINT 26ML (MISCELLANEOUS) ×2 IMPLANT
CLIP APPLIE ROT 10 11.4 M/L (STAPLE) ×1 IMPLANT
CONRAY 60ML FOR OR (MISCELLANEOUS) ×2 IMPLANT
DISSECTOR KITTNER STICK (MISCELLANEOUS) IMPLANT
DISSECTORS/KITTNER STICK (MISCELLANEOUS)
DRAPE SHEET LG 3/4 BI-LAMINATE (DRAPES) ×2 IMPLANT
DRESSING TELFA 4X3 1S ST N-ADH (GAUZE/BANDAGES/DRESSINGS) ×2 IMPLANT
DRSG TEGADERM 2-3/8X2-3/4 SM (GAUZE/BANDAGES/DRESSINGS) ×8 IMPLANT
ENDOPOUCH RETRIEVER 10 (MISCELLANEOUS) IMPLANT
GLOVE BIO SURGEON STRL SZ7.5 (GLOVE) ×6 IMPLANT
GLOVE INDICATOR 8.0 STRL GRN (GLOVE) ×6 IMPLANT
GOWN STRL REUS W/ TWL LRG LVL3 (GOWN DISPOSABLE) ×3 IMPLANT
GOWN STRL REUS W/TWL LRG LVL3 (GOWN DISPOSABLE) ×6
IRRIGATION STRYKERFLOW (MISCELLANEOUS) ×1 IMPLANT
IRRIGATOR STRYKERFLOW (MISCELLANEOUS) ×2
IV LACTATED RINGERS 1000ML (IV SOLUTION) ×2 IMPLANT
KIT RM TURNOVER STRD PROC AR (KITS) ×2 IMPLANT
LABEL OR SOLS (LABEL) ×2 IMPLANT
NS IRRIG 500ML POUR BTL (IV SOLUTION) ×2 IMPLANT
PACK LAP CHOLECYSTECTOMY (MISCELLANEOUS) ×2 IMPLANT
PAD GROUND ADULT SPLIT (MISCELLANEOUS) ×2 IMPLANT
SCISSORS METZENBAUM CVD 33 (INSTRUMENTS) ×2 IMPLANT
SEAL FOR SCOPE WARMER C3101 (MISCELLANEOUS) ×2 IMPLANT
STRIP CLOSURE SKIN 1/2X4 (GAUZE/BANDAGES/DRESSINGS) ×2 IMPLANT
SUT VIC AB 0 CT2 27 (SUTURE) ×2 IMPLANT
SUT VIC AB 4-0 FS2 27 (SUTURE) ×2 IMPLANT
SWABSTK COMLB BENZOIN TINCTURE (MISCELLANEOUS) ×2 IMPLANT
TROCAR XCEL NON-BLD 11X100MML (ENDOMECHANICALS) ×2 IMPLANT
TUBING INSUFFLATOR HI FLOW (MISCELLANEOUS) ×2 IMPLANT
WATER STERILE IRR 1000ML POUR (IV SOLUTION) ×2 IMPLANT

## 2015-06-03 NOTE — Discharge Instructions (Addendum)
Cholecystostomy  The gallbladder is a pear-shaped organ that lies beneath the liver on the right side of the body. The gallbladder stores bile, a fluid that helps the body digest fats. However, sometimes bile and other fluids build up in the gallbladder because of an obstruction (for example, a gallstones). This can cause fever, pain, swelling, nausea and other serious symptoms. The procedure used to drain these fluids is called a cholecystostomy. A tube is inserted into the gallbladder. Fluid drains through the tube into a plastic bag outside the body. This procedure is usually done on people who are admitted to the hospital. The procedure is often recommended for people who cannot have gallbladder surgery right away, usually because they are too ill to make it through surgery. The cholecystostomy tube is usually temporary, until surgery can be done. RISKS AND COMPLICATIONS Although rare, complications can include:  Clogging of the tube.  Infection in or around the drain site. Antibiotics might be prescribed for the infection. Or, another tube might be inserted to drain the infected fluid.  Internal bleeding from the liver. BEFORE THE PROCEDURE   Try to quit smoking several weeks before the procedure. Smoking can slow healing.  Arrange for someone to drive you home from the hospital.  Right before your procedure, avoid all foods and liquids after midnight. This includes coffee, tea and water.  On the day of the procedure, arrive early to fill out all the paperwork. PROCEDURE You will be given a sedative to make you sleepy and a local anesthetic to numb the skin. Next, a small cut is made in the abdomen. Then a tube is threaded through the cut into the gallbladder. The procedure is usually done with ultrasound to guide the tube into the gallbladder. Once the tube is in place, the drain is secured to the skin with a stitch. The tube is then connected to a drainage bag.  AFTER THE PROCEDURE    People who have a cholecystostomy usually stay in the hospital for several days because they are so ill. You might not be able to eat for the first few days. Instead, you will be connected to an IV for fluids and nutrients.  The procedure does not cure the blockage that caused the fluid to build up in the first place. Because of this, the gallbladder will need to be removed in the future. The drain is removed at that time. HOME CARE INSTRUCTIONS  Be sure to follow your healthcare provider's instructions carefully. You may shower but avoid tub baths and swimming until your caregiver says it is OK. Eat and drink according to the directions you have been given. And be sure to make all follow-up appointments.  Call your healthcare provider if you notice new pain, redness or swelling around the wound. SEEK IMMEDIATE MEDICAL CARE IF:   There is increased abdominal pain.  Nausea or vomiting occurs.  You develop a fever.  The drainage tube comes out of the abdomen. Document Released: 01/14/2009 Document Revised: 01/10/2012 Document Reviewed: 01/14/2009 [facility] Patient Information 2015 [place], [place]. This information is not intended to replace advice given to you by your health care provider. Make sure you discuss any questions you have with your health care provider.   AMBULATORY SURGERY  DISCHARGE INSTRUCTIONS   1) The drugs that you were given will stay in your system until tomorrow so for the next 24 hours you should not:  A) Drive an automobile B) Make any legal decisions C) Drink any alcoholic beverage  2) You may resume regular meals tomorrow.  Today it is better to start with liquids and gradually work up to solid foods.  You may eat anything you prefer, but it is better to start with liquids, then soup and crackers, and gradually work up to solid foods.   3) Please notify your doctor immediately if you have any unusual bleeding, trouble breathing, redness and pain at the  surgery site, drainage, fever, or pain not relieved by medication. 4)   5) Your post-operative visit with Dr.                                     is: Date:                        Time:    Please call to schedule your post-operative visit.  6) Additional Instructions: 7)

## 2015-06-03 NOTE — Anesthesia Preprocedure Evaluation (Signed)
Anesthesia Evaluation  Patient identified by MRN, date of birth, ID band Patient awake    Reviewed: Allergy & Precautions, H&P , NPO status , Patient's Chart, lab work & pertinent test results, reviewed documented beta blocker date and time   Airway Mallampati: II  TM Distance: >3 FB Neck ROM: full    Dental no notable dental hx. (+) Teeth Intact   Pulmonary neg shortness of breath, neg sleep apnea, neg COPDneg recent URI, former smoker,  breath sounds clear to auscultation  Pulmonary exam normal       Cardiovascular Exercise Tolerance: Good negative cardio ROS Normal cardiovascular examRhythm:regular Rate:Normal     Neuro/Psych negative neurological ROS  negative psych ROS   GI/Hepatic Neg liver ROS, GERD-  Medicated and Controlled,  Endo/Other  negative endocrine ROS  Renal/GU negative Renal ROS  negative genitourinary   Musculoskeletal   Abdominal   Peds  Hematology negative hematology ROS (+)   Anesthesia Other Findings Past Medical History:   GERD (gastroesophageal reflux disease)                       Anemia                                                         Comment:when pregnant only   Reproductive/Obstetrics negative OB ROS                             Anesthesia Physical Anesthesia Plan  ASA: II  Anesthesia Plan: General   Post-op Pain Management:    Induction:   Airway Management Planned:   Additional Equipment:   Intra-op Plan:   Post-operative Plan:   Informed Consent: I have reviewed the patients History and Physical, chart, labs and discussed the procedure including the risks, benefits and alternatives for the proposed anesthesia with the patient or authorized representative who has indicated his/her understanding and acceptance.   Dental Advisory Given  Plan Discussed with: Anesthesiologist, CRNA and Surgeon  Anesthesia Plan Comments:          Anesthesia Quick Evaluation

## 2015-06-03 NOTE — H&P (Addendum)
The patient has failed to improve with a trial of anti- spasmodics including Bentyl and Levsin. She continued to have right upper quadrant pain with radiation to the right side and subscapular region with solid foods. No reflux symptoms. For elective cholecystectomy.  The patient and her family have again been advised that there is no guarantee that cholecystectomy will relieve her symptoms.

## 2015-06-03 NOTE — Op Note (Signed)
Preoperative diagnosis: Chronic cholecystitis.  Postoperative diagnosis: Same with cholesterolosis.  Operative procedure: Laparoscopic cholecystectomy with intraoperative cholangiograms.  Operating surgeon: Jasmine December, M.D.  Anesthesia: Gen. endotracheal.  Estimated blood loss: Less than 5 mL.  Clinical note this 29 year old woman has had progressive right upper quadrant pain with radiation to the right shoulder and subscapular area. This is most pronounced with solid foods. No significant reflux symptoms. No improvement with Protonix, Zantac, Carafate, Bentyl and Levsin. Ultrasound was negative for stones. HIDA scan showed an ejection fraction of greater than 60%. CCK injection reproduced the patient's pain. As she has failed conservative measures she was felt to be candidate for elective cholecystectomy. The possibility that surgery would not relieve her symptoms was reviewed.  Operative note: With the patient under adequate general endotracheal anesthesia the abdomen was prepped with chlor prep and drape. In Trendelenburg position a varies needle was placed with trans-umbilical incision. After assuring intra-abdominal location with the hanging drop test the abdomen was insufflated with CO2 a 10 mmHg pressure. A 12 mm step port was expanded and inspection showed no evidence of injury from initial port placement. Imaging of the gallbladder showed loss of the normal "robins egg blue" coloration with evidence of chronic scarring. Mild thickening of the adipose tissue around the neck of the gallbladder and prominence of the lymph node of [place].  An 11 mm XL port was placed in the epigastrium and 2-5 mm step ports placed laterally. The gallbladder was placed on cephalad traction and the neck of the gallbladder cleared. Fluoroscopic cholangiograms were completed using 7 mL of one half strength Conray 60. This showed prompt filling of the right and left hepatic ducts, common bile duct and free flow  into the duodenum. The cystic duct and branches of the cystic artery were doubly clipped and divided. The gallbladder was removed from the liver bed making use of hook cautery dissection. It was delivered to the umbilical port site without incident. Opening the specimen at the end of the procedure showed a 4 mm pedunculated the positive cholesterolosis as well as general cholesterolosis of the surface. No free-floating stones were identified.  With pneumoperitoneum reestablished inspection from the epigastric site showed no incident injury from initial port placement. Imaging of the diaphragms was unremarkable. No evidence of chronic inflammatory process outside of that related to the gallbladder.  The abdomen was irrigated with lactated Ringer's solution. It was then desufflated and ports removed under direct vision. The fascia at the umbilicus was closed with a 0 proline figure-of-eight suture. Skin incisions were closed with 4-0 Vicryls subcuticular sutures. Benzoin, Steri-Strips, Telfa and Tegaderm dressings were then applied.  The patient tolerated the procedure well was taken to recovery room stable condition.

## 2015-06-03 NOTE — Anesthesia Postprocedure Evaluation (Signed)
  Anesthesia Post-op Note  Patient: Michele Houston  Procedure(s) Performed: Procedure(s): LAPAROSCOPIC CHOLECYSTECTOMY WITH INTRAOPERATIVE CHOLANGIOGRAM (N/A)  Anesthesia type:General  Patient location: PACU  Post pain: Pain level controlled  Post assessment: Post-op Vital signs reviewed, Patient's Cardiovascular Status Stable, Respiratory Function Stable, Patent Airway and No signs of Nausea or vomiting  Post vital signs: Reviewed and stable  Last Vitals:  Filed Vitals:   06/03/15 1214  BP: 124/70  Pulse: 54  Temp:   Resp: 14    Level of consciousness: awake, alert  and patient cooperative  Complications: No apparent anesthesia complications

## 2015-06-03 NOTE — Transfer of Care (Signed)
Immediate Anesthesia Transfer of Care Note  Patient: Michele Houston  Procedure(s) Performed: Procedure(s): LAPAROSCOPIC CHOLECYSTECTOMY WITH INTRAOPERATIVE CHOLANGIOGRAM (N/A)  Patient Location: PACU  Anesthesia Type:General  Level of Consciousness: sedated and responds to stimulation  Airway & Oxygen Therapy: Patient Spontanous Breathing and Patient connected to face mask oxygen  Post-op Assessment: Report given to RN and Post -op Vital signs reviewed and stable  Post vital signs: Reviewed and stable  Last Vitals:  Filed Vitals:   06/03/15 1128  BP: 127/78  Pulse: 60  Temp: 36.3 C  Resp: 16    Complications: No apparent anesthesia complications

## 2015-06-03 NOTE — Anesthesia Procedure Notes (Signed)
Procedure Name: Intubation Date/Time: 06/03/2015 10:38 AM Performed by: Omer Jack Pre-anesthesia Checklist: Patient identified, Patient being monitored, Timeout performed, Emergency Drugs available and Suction available Patient Re-evaluated:Patient Re-evaluated prior to inductionOxygen Delivery Method: Circle system utilized Preoxygenation: Pre-oxygenation with 100% oxygen Intubation Type: IV induction Ventilation: Mask ventilation without difficulty Laryngoscope Size: Miller and 2 Grade View: Grade I Tube type: Oral Tube size: 7.0 mm Number of attempts: 1 Airway Equipment and Method: Stylet Placement Confirmation: ETT inserted through vocal cords under direct vision,  positive ETCO2 and breath sounds checked- equal and bilateral Secured at: 21 cm Tube secured with: Tape Dental Injury: Teeth and Oropharynx as per pre-operative assessment

## 2015-06-04 LAB — SURGICAL PATHOLOGY

## 2015-06-12 ENCOUNTER — Encounter: Payer: Self-pay | Admitting: General Surgery

## 2015-06-12 ENCOUNTER — Ambulatory Visit (INDEPENDENT_AMBULATORY_CARE_PROVIDER_SITE_OTHER): Payer: BLUE CROSS/BLUE SHIELD | Admitting: General Surgery

## 2015-06-12 VITALS — BP 120/82 | HR 82 | Resp 12 | Ht 67.0 in | Wt 175.0 lb

## 2015-06-12 DIAGNOSIS — R1011 Right upper quadrant pain: Secondary | ICD-10-CM

## 2015-06-12 DIAGNOSIS — K824 Cholesterolosis of gallbladder: Secondary | ICD-10-CM

## 2015-06-12 NOTE — Patient Instructions (Addendum)
Patient to return to work on 06/16/15. Return as needed. Proper lifting techniques reviewed.

## 2015-06-12 NOTE — Progress Notes (Signed)
Patient ID: Michele Houston, female   DOB: 05-05-1986, 29 y.o.   MRN: [id number]  Chief Complaint  Patient presents with  . Routine Post Op    gallbladder surgery    HPI Michele Houston is a 29 y.o. female here today for her post op gallbladder surgery which was performed on 06/03/15. Patient states she is doing well. Patient noted some mucus in stool on 06/07/15, less so yesterday. She had some lower abdominal discomfort after eating [place] food prior to her first post surgery bowel movement. It was with this bowel movement that she had the most mucus in her stools. She has had complete resolution of her previously present right upper quadrant pain. HPI  Past Medical History  Diagnosis Date  . GERD (gastroesophageal reflux disease)   . Anemia     when pregnant only    Past Surgical History  Procedure Laterality Date  . Tubes in ears  baby  . Knee surgery Left 2011    arthroscopy  . Cholecystectomy N/A 06/03/2015    Procedure: LAPAROSCOPIC CHOLECYSTECTOMY WITH INTRAOPERATIVE CHOLANGIOGRAM;  Surgeon: Earline [place], MD;  Location: [facility] ORS;  Service: General;  Laterality: N/A;    No family history on file.  Social History Social History  Substance Use Topics  . Smoking status: Former Smoker -- 1.00 packs/day for 4 years    Types: Cigarettes    Quit date: 05/30/2011  . Smokeless tobacco: None  . Alcohol Use: No    Allergies  Allergen Reactions  . Band-Aid Plus Antibiotic [Bacitracin-Polymyxin B] Itching  . Ceclor [Cefaclor] Hives    Current Outpatient Prescriptions  Medication Sig Dispense Refill  . cetirizine (ZYRTEC) 5 MG tablet Take 10 mg by mouth.    . oxyCODONE-acetaminophen (PERCOCET/ROXICET) 5-325 MG per tablet Take by mouth.    . pantoprazole (PROTONIX) 40 MG tablet Take 1 tablet (40 mg total) by mouth daily. (Patient taking differently: Take 40 mg by mouth every morning. ) 30 tablet 1  . ranitidine (ZANTAC) 150 MG capsule Take 150 mg by mouth 2 (two) times daily.      . sucralfate (CARAFATE) 1 G tablet Take 1 tablet (1 g total) by mouth 4 (four) times daily. (Patient not taking: Reported on 05/30/2015) 120 tablet 1   No current facility-administered medications for this visit.    Review of Systems Review of Systems  Constitutional: Negative.   Respiratory: Negative.   Cardiovascular: Negative.     Blood pressure 120/82, pulse 82, resp. rate 12, height  (1.702 m), weight 175 lb (79.379 kg).  Physical Exam Physical Exam  Constitutional: She is oriented to person, place, and time. She appears well-developed and well-nourished.  Eyes: Conjunctivae are normal. No scleral icterus.  Cardiovascular: Normal rate, regular rhythm and normal heart sounds.   Pulmonary/Chest: Effort normal and breath sounds normal.  Abdominal: Soft. Normal appearance and bowel sounds are normal.    Port site is clean and healing well.   Neurological: She is alert and oriented to person, place, and time.  Skin: Skin is warm and dry.    Data Reviewed DIAGNOSIS:  A. GALLBLADDER; CHOLECYSTECTOMY:  - CHRONIC CHOLECYSTITIS AND POLYPOID CHOLESTEROLOSIS.  - NEGATIVE FOR DYSPLASIA AND MALIGNANCY.    Assessment    Doing well status post cholecystectomy.    Plan    The patient shouldn't is sprayed normalization of her bowel function over the next 2-4 weeks. She has been encouraged to call if she has persistent mucus in the stools. Activity  may be increased as tolerated.   Patient to return as needed.  PCP:  Birder Robson 06/12/2015, 9:12 PM

## 2015-08-11 DIAGNOSIS — F339 Major depressive disorder, recurrent, unspecified: Secondary | ICD-10-CM | POA: Insufficient documentation

## 2015-09-23 DIAGNOSIS — L739 Follicular disorder, unspecified: Secondary | ICD-10-CM | POA: Insufficient documentation

## 2015-12-08 DIAGNOSIS — K649 Unspecified hemorrhoids: Secondary | ICD-10-CM | POA: Insufficient documentation

## 2017-04-15 IMAGING — NM NM HEPATO W/GB/PHARM/[PERSON_NAME]
3 series · 18 of 18 positions shown · non-contrast
Comparison: Abdominal ultrasound 05/25/2015

CLINICAL DATA: 28-year-old female with 1 week history of right
upper quadrant abdominal pain, nausea and vomiting.

EXAM:
NUCLEAR MEDICINE HEPATOBILIARY IMAGING WITH GALLBLADDER EF
TECHNIQUE: Sequential images of the abdomen were obtained [DATE] minutes
following intravenous administration of radiopharmaceutical. After
slow intravenous infusion of 1.65 micrograms Cholecystokinin,
gallbladder ejection fraction was determined.
RADIOPHARMACEUTICALS:  5.09 mCi Pc-RRm Choletec IV

[Series 1000: hepatobiliary dynamic · 9.59mm/px · 6 of 60 frames shown]
[frame 6/60]
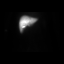
[frame 16/60]
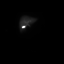
[frame 26/60]
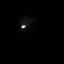
[frame 36/60]
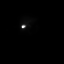
[frame 46/60]
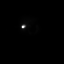
[frame 56/60]
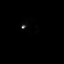

[Series 1000: gallbladder ef dynamic (results) · 4.80mm/px · 6 of 120 frames shown]
[frame 11/120]
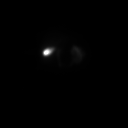
[frame 31/120]
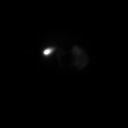
[frame 51/120]
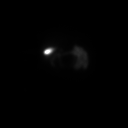
[frame 71/120]
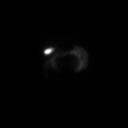
[frame 91/120]
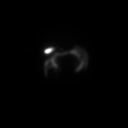
[frame 111/120]
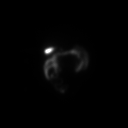

[Series 1000: gallbladder ef dynamic · 4.80mm/px · 6 of 120 frames shown]
[frame 11/120]
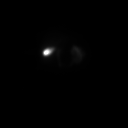
[frame 31/120]
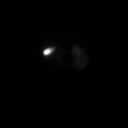
[frame 51/120]
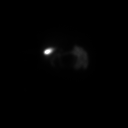
[frame 71/120]
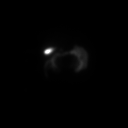
[frame 91/120]
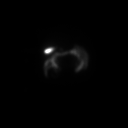
[frame 111/120]
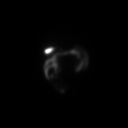

[18 of 18 positions shown; findings below may reference images not displayed]

FINDINGS: Normal visualization of the gallbladder and intrahepatic bile ducts
between 6 and 15 minutes.. The gallbladder ejection fraction is
calculated at 60%. At 45 min, normal ejection fraction is greater
than 40%.
IMPRESSION: Normal study.

## 2017-04-21 IMAGING — CR DG CHOLANGIOGRAM OPERATIVE
2 series · 12 of 12 positions shown · non-contrast
Comparison: None.

CLINICAL DATA: Cholecystitis

EXAM:
INTRAOPERATIVE CHOLANGIOGRAM
TECHNIQUE: Cholangiographic images from the C-arm fluoroscopic device were
submitted for interpretation post-operatively. Please see the
procedural report for the amount of contrast and the fluoroscopy
time utilized.

[Series 1: cont. · 2 of 2 frames shown (1 of 2)]
[frame 1/2]
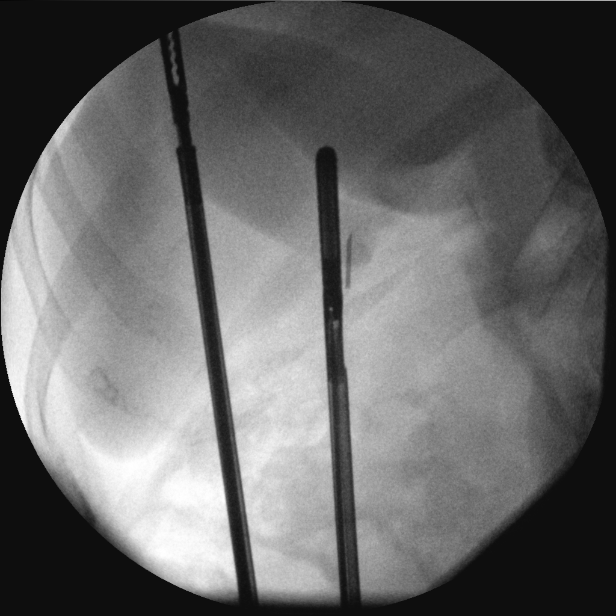
[frame 2/2]
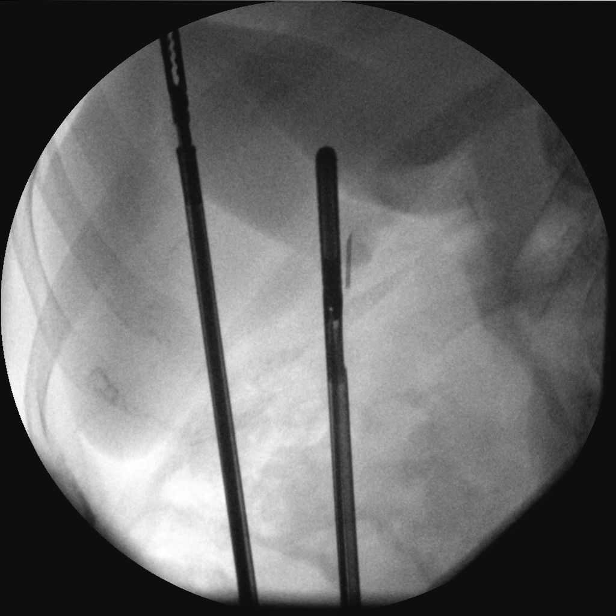

[Series 2: cont. · 10 of 34 frames shown (2 of 2)]
[frame 1/34]
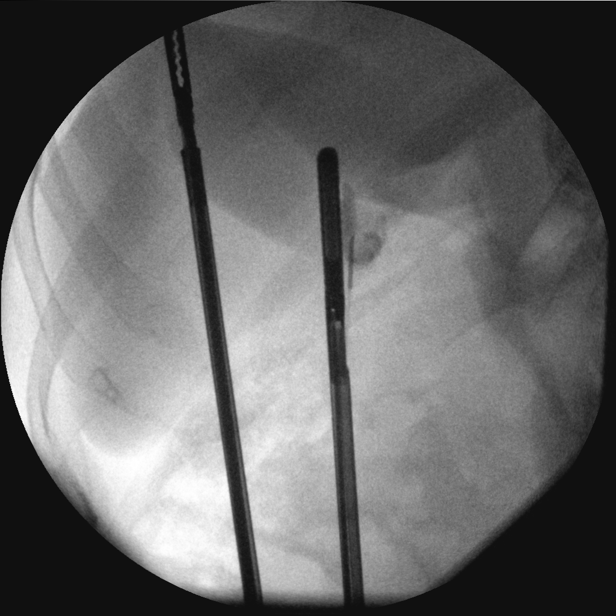
[frame 4/34]
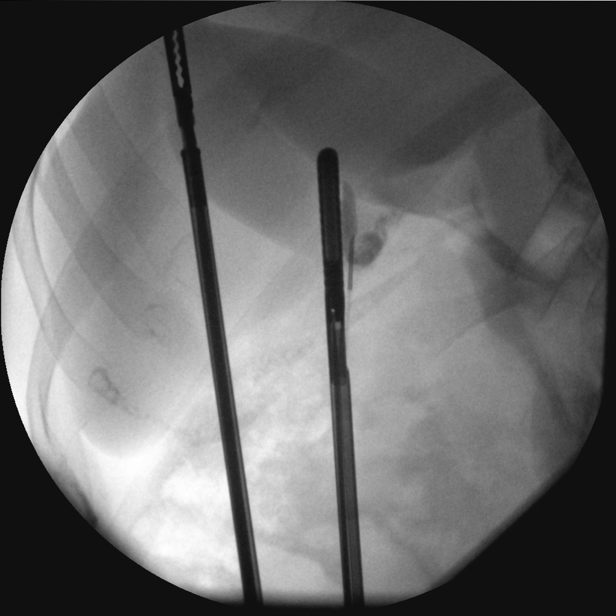
[frame 8/34]
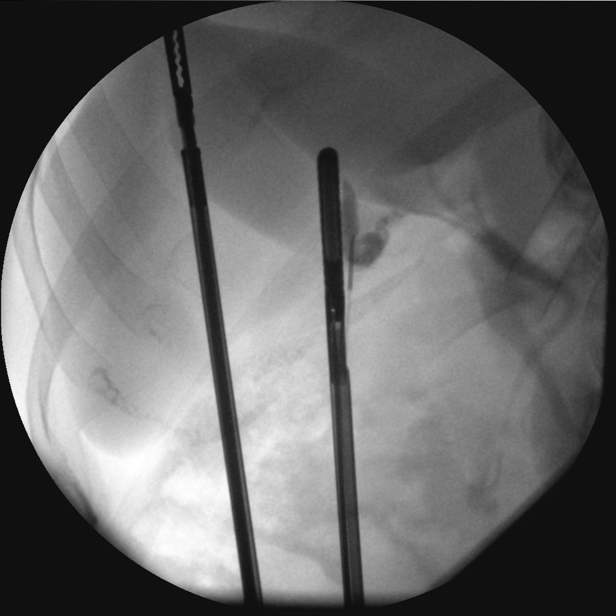
[frame 12/34]
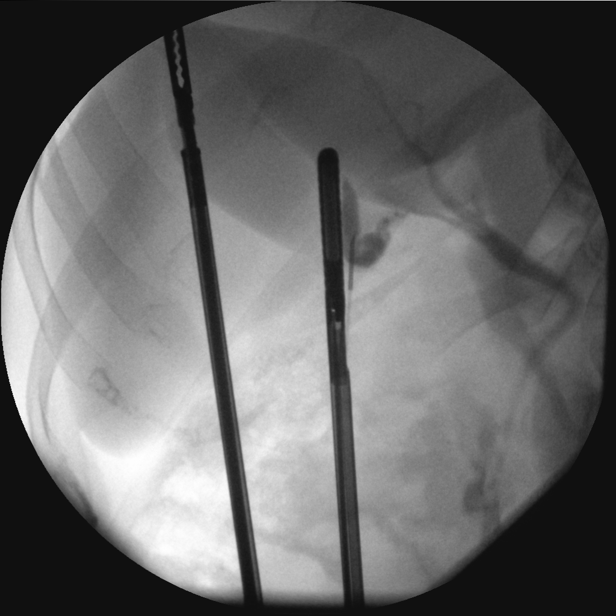
[frame 15/34]
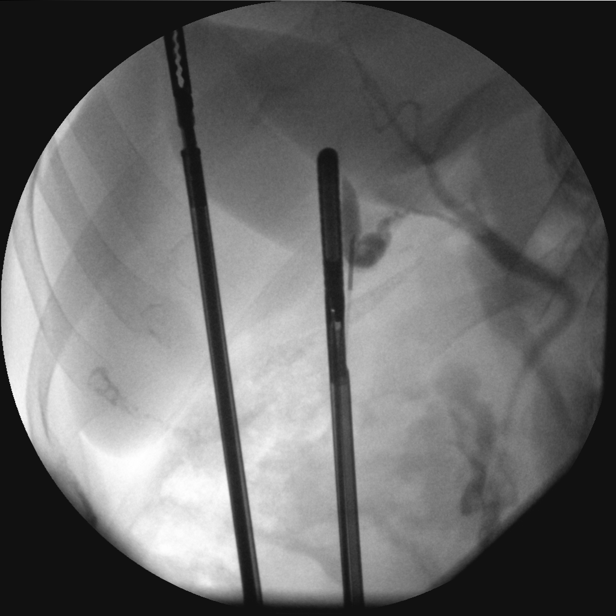
[frame 19/34]
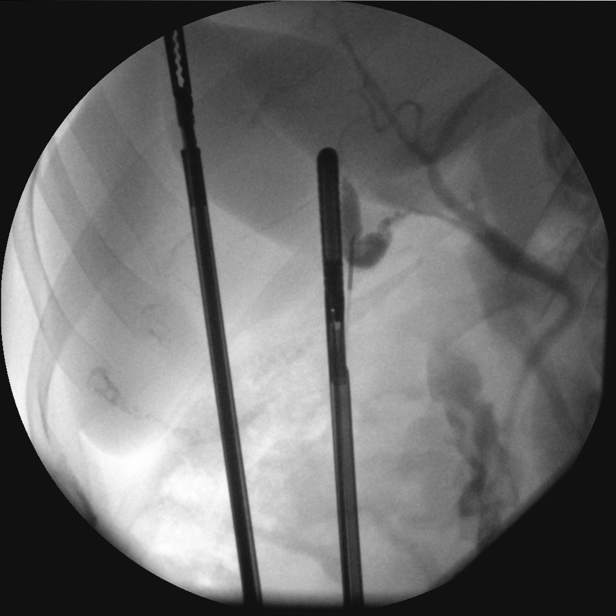
[frame 23/34]
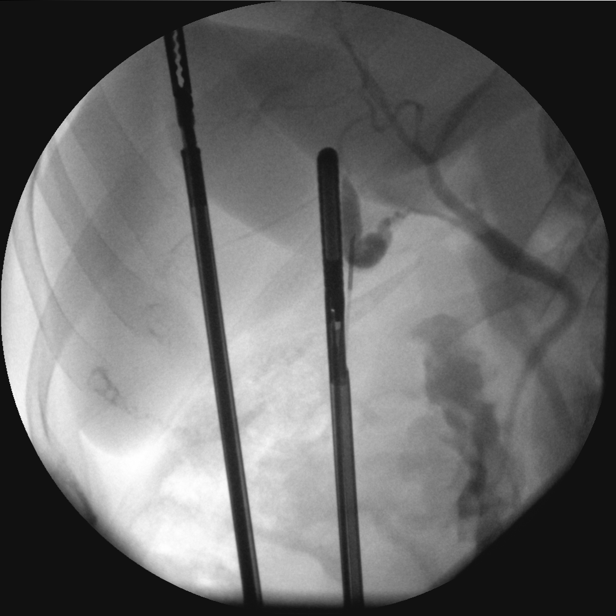
[frame 26/34]
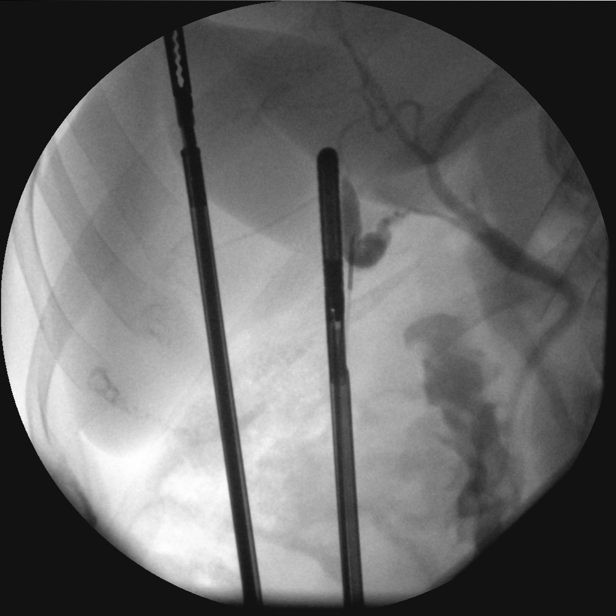
[frame 30/34]
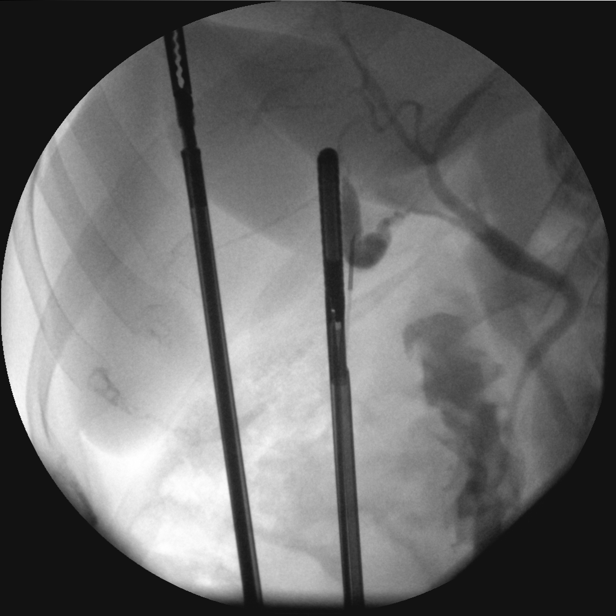
[frame 34/34]
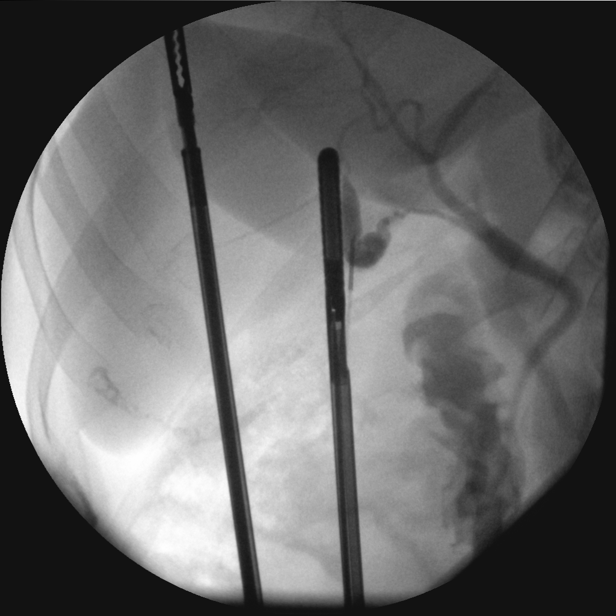

[12 of 12 positions shown; findings below may reference images not displayed]

FINDINGS: Contrast fills the biliary tree and duodenum without filling defects
in the common bile duct.
IMPRESSION: Patent biliary tree.

## 2017-08-03 ENCOUNTER — Encounter: Payer: Self-pay | Admitting: Physician Assistant

## 2017-08-03 ENCOUNTER — Ambulatory Visit: Payer: Self-pay | Admitting: Physician Assistant

## 2017-08-03 VITALS — BP 120/70 | HR 74 | Temp 98.5°F

## 2017-08-03 DIAGNOSIS — Z5320 Procedure and treatment not carried out because of patient's decision for unspecified reasons: Secondary | ICD-10-CM

## 2017-08-03 NOTE — Progress Notes (Signed)
   Subjective:Medication Request    Patient ID: Michele Houston, female    DOB: July 15, 1986, 31 y.o.   MRN: [id number]  HPI Patient request prescription for Wellbutrin. States took medication in the past for depression. No record available to confirm diagnosis or medication.   Review of Systems    Negative except for compliant. Objective:   Physical Exam Deferred       Assessment & Plan:Medication request.  Advise patient I am uncomfortable prescribing this medication without further evaluation. Advised follow up with Family Doctor.

## 2017-08-08 ENCOUNTER — Ambulatory Visit: Payer: Self-pay | Admitting: Physician Assistant

## 2017-11-28 LAB — TSH: TSH: 0.89 (ref 0.41–5.90)

## 2018-08-29 MED FILL — BUPROPION SR 150 MG TABLET: 150 | 90 days supply | Qty: 90 | Fill #0

## 2018-10-01 ENCOUNTER — Ambulatory Visit (INDEPENDENT_AMBULATORY_CARE_PROVIDER_SITE_OTHER): Payer: Self-pay | Admitting: Family Medicine

## 2018-10-01 VITALS — BP 125/75 | HR 73 | Temp 98.6°F | Resp 16 | Wt 191.6 lb

## 2018-10-01 DIAGNOSIS — H6593 Unspecified nonsuppurative otitis media, bilateral: Secondary | ICD-10-CM

## 2018-10-01 DIAGNOSIS — J302 Other seasonal allergic rhinitis: Secondary | ICD-10-CM

## 2018-10-01 DIAGNOSIS — Z9889 Other specified postprocedural states: Secondary | ICD-10-CM

## 2018-10-01 DIAGNOSIS — R05 Cough: Secondary | ICD-10-CM

## 2018-10-01 DIAGNOSIS — R059 Cough, unspecified: Secondary | ICD-10-CM

## 2018-10-01 MED ORDER — LEVOCETIRIZINE DIHYDROCHLORIDE 5 MG PO TABS: 5.0000 mg | ORAL_TABLET | Freq: Every evening | ORAL | 0 refills | Status: DC

## 2018-10-01 MED ORDER — PSEUDOEPH-BROMPHEN-DM 30-2-10 MG/5ML PO SYRP: 10.0000 mL | ORAL_SOLUTION | Freq: Three times a day (TID) | ORAL | 0 refills | Status: DC | PRN

## 2018-10-01 MED ORDER — PREDNISONE 20 MG PO TABS: 20.0000 mg | ORAL_TABLET | Freq: Every day | ORAL | 0 refills | Status: AC

## 2018-10-01 MED ORDER — AMOXICILLIN-POT CLAVULANATE 875-125 MG PO TABS: 1.0000 | ORAL_TABLET | Freq: Two times a day (BID) | ORAL | 0 refills | Status: AC

## 2018-10-01 NOTE — Progress Notes (Signed)
Nonnie DoneKrista R Wrench is a 32 y.o. female who presents today with concerns of ear pain for the last 2 days with a cough that has been present for at least 5 days. She reports a history of seasonal allergies and taking her medication daily. She reports little sleep the past few days and being out and about due to Thanksgiving sales/shopping. She reports that she is otherwise healthy. Of note she mentions complicated ENT history with tympanostomy tubes placed bilaterally in her youth. She does report recent feelings of ear fullness and tinnitus. She denies using any medication to treat her symptoms in the last week or so.  Review of Systems  Constitutional: Negative for chills, fever and malaise/fatigue.  HENT: Positive for ear pain and tinnitus. Negative for congestion, ear discharge, sinus pain and sore throat.   Eyes: Negative.   Respiratory: Positive for cough. Negative for sputum production and shortness of breath.   Cardiovascular: Negative.  Negative for chest pain.  Gastrointestinal: Negative for abdominal pain, diarrhea, nausea and vomiting.  Genitourinary: Negative for dysuria, frequency, hematuria and urgency.  Musculoskeletal: Negative for myalgias.  Skin: Negative.   Neurological: Negative for headaches.  Endo/Heme/Allergies: Negative.   Psychiatric/Behavioral: Negative.     O: Vitals:   10/01/18 1036  BP: 125/75  Pulse: 73  Resp: 16  Temp: 98.6 F (37 C)  SpO2: 98%     Physical Exam  Constitutional: She is oriented to person, place, and time. Vital signs are normal. She appears well-developed and well-nourished. She is active.  Non-toxic appearance. She does not have a sickly appearance.  HENT:  Head: Normocephalic.  Right Ear: Hearing, external ear and ear canal normal. A middle ear effusion is present.  Left Ear: Hearing, external ear and ear canal normal. There is tenderness. Tympanic membrane is bulging. A middle ear effusion is present.  Nose: Rhinorrhea present. Right  sinus exhibits no maxillary sinus tenderness and no frontal sinus tenderness. Left sinus exhibits no maxillary sinus tenderness and no frontal sinus tenderness.  Mouth/Throat: Uvula is midline and oropharynx is clear and moist.  Neck: Normal range of motion. Neck supple.  Cardiovascular: Normal rate, regular rhythm, normal heart sounds and normal pulses.  Pulmonary/Chest: Effort normal and breath sounds normal.  Abdominal: Soft. Bowel sounds are normal.  Musculoskeletal: Normal range of motion.  Lymphadenopathy:       Head (right side): Tonsillar adenopathy present. No submental and no submandibular adenopathy present.       Head (left side): Tonsillar adenopathy present. No submental and no submandibular adenopathy present.    She has no cervical adenopathy.  Neurological: She is alert and oriented to person, place, and time.  Psychiatric: She has a normal mood and affect.  Vitals reviewed.  A: 1. MEE (middle ear effusion), bilateral   2. Hx of tympanostomy tubes   3. Seasonal allergies   4. Cough      P: Discussed exam findings, diagnosis etiology and medication use and indications reviewed with patient. Follow- Up and discharge instructions provided. No emergent/urgent issues found on exam.  Patient verbalized understanding of information provided and agrees with plan of care (POC), all questions answered.  1. MEE (middle ear effusion), bilateral - amoxicillin-clavulanate (AUGMENTIN) 875-125 MG tablet; Take 1 tablet by mouth 2 (two) times daily for 7 days. - predniSONE (DELTASONE) 20 MG tablet; Take 1 tablet (20 mg total) by mouth daily with breakfast for 5 days. No allergy patient has used medication in the past without issue. Not pregnant- IUD-  provided information on how to obtain a PCP.  2. Hx of tympanostomy tubes - predniSONE (DELTASONE) 20 MG tablet; Take 1 tablet (20 mg total) by mouth daily with breakfast for 5 days.  3. Seasonal allergies - levocetirizine (XYZAL) 5  MG tablet; Take 1 tablet (5 mg total) by mouth every evening.  Discussed using different antihistamine due to using Zyrtec for 4 + years.  4. Cough - brompheniramine-pseudoephedrine-DM 30-2-10 MG/5ML syrup; Take 10 mLs by mouth 3 (three) times daily as needed.  Other orders - buPROPion (WELLBUTRIN SR) 150 MG 12 hr tablet - sodium chloride (OCEAN) 0.65 % SOLN nasal spray; Place 1 spray into both nostrils as needed for congestion. - famotidine (PEPCID) 20 MG tablet; Take 20 mg by mouth 2 (two) times daily.

## 2018-10-01 NOTE — Patient Instructions (Signed)
Otitis Media With Effusion, Pediatric Otitis media with effusion (OME) occurs when there is inflammation of the middle ear and fluid in the middle ear space. There are no signs and symptoms of infection. The middle ear space contains air and the bones for hearing. Air in the middle ear space helps to transmit sound to the brain. OME is a common condition in children, and it often occurs after an ear infection. This condition may be present for several weeks or longer after an ear infection. Most cases of this condition get better on their own. What are the causes? OME is caused by a blockage of the eustachian tube in one or both ears. These tubes drain fluid in the ears to the back of the nose (nasopharynx). If the tissue in the tube swells up (edema), the tube closes. This prevents fluid from draining. Blockage can be caused by:  Ear infections.  Colds and other upper respiratory infections.  Allergies.  Irritants, such as tobacco smoke.  Enlarged adenoids. The adenoids are areas of soft tissue located high in the back of the throat, behind the nose and the roof of the mouth. They are part of the body's natural defense (immune) system.  A mass in the nasopharynx.  Damage to the ear caused by pressure changes (barotrauma).  What increases the risk? Your child is more likely to develop this condition if:  He or she has repeated ear and sinus infections.  He or she has allergies.  He or she is exposed to tobacco smoke.  He or she attends daycare.  He or she is not breastfed.  What are the signs or symptoms? Symptoms of this condition may not be obvious. Sometimes this condition does not have any symptoms, or symptoms may overlap with those of a cold or upper respiratory tract illness. Symptoms of this condition include:  Temporary hearing loss.  A feeling of fullness in the ear without pain.  Irritability or agitation.  Balance (vestibular) problems.  As a result of hearing  loss, your child may:  Listen to the TV at a loud volume.  Not respond to questions.  Ask "What?" often when spoken to.  Mistake or confuse one sound or word for another.  Perform poorly at school.  Have a poor attention span.  Become agitated or irritated easily.  How is this diagnosed? This condition is diagnosed with an ear exam. Your child's health care provider will look inside your child's ear with an instrument (otoscope) to check for redness, swelling, and fluid. Other tests may be done, including:  A test to check the movement of the eardrum (pneumatic otoscopy). This is done by squeezing a small amount of air into the ear.  A test that changes air pressure in the middle ear to check how well the eardrum moves and to see if the eustachian tube is working (tympanogram).  Hearing test (audiogram). This test involves playing tones at different pitches to see if your child can hear each tone.  How is this treated? Treatment for this condition depends on the cause. In many cases, the fluid goes away on its own. In some cases, your child may need a procedure to create a hole in the eardrum to allow fluid to drain (myringotomy) and to insert small drainage tubes (tympanostomy tubes) into the eardrums. These tubes help to drain fluid and prevent infection. This procedure may be recommended if:  OME does not get better over several months.  Your child has many ear   infections within several months.  Your child has noticeable hearing loss.  Your child has problems with speech and language development.  Surgery may also be done to remove the adenoids (adenoidectomy). Follow these instructions at home:  Give over-the-counter and prescription medicines only as told by your child's health care provider.  Keep children away from any tobacco smoke.  Keep all follow-up visits as told by your child's health care provider. This is important. How is this prevented?  Keep your  child's vaccinations up to date. Make sure your child gets all recommended vaccinations, including a pneumonia and flu vaccine.  Encourage hand washing. Your child should wash his or her hands often with soap and water. If there is no soap and water, he or she should use hand sanitizer.  Avoid exposing your child to tobacco smoke.  Breastfeed your baby, if possible. Babies who are breastfed as long as possible are less likely to develop this condition. Contact a health care provider if:  Your child's hearing does not get better after 3 months.  Your child's hearing is worse.  Your child has ear pain.  Your child has a fever.  Your child has drainage from the ear.  Your child is dizzy.  Your child has a lump on his or her neck. Get help right away if:  Your child has bleeding from the nose.  Your child cannot move part of her or his face.  Your child has trouble breathing.  Your child cannot smell.  Your child develops severe congestion.  Your child develops weakness.  Your child who is younger than 3 months has a temperature of 100F (38C) or higher. Summary  Otitis media with effusion (OME) occurs when there is inflammation of the middle ear and fluid in the middle ear space.  This condition is caused by blockage of one or both eustachian tubes, which drain fluid in the ears to the back of the nose.  Symptoms of this condition can include temporary hearing loss, a feeling of fullness in the ear, irritability or agitation, and balance (vestibular) problems. Sometimes, there are no symptoms.  This condition is diagnosed with an ear exam and tests, such as pneumatic otoscopy, tympanogram, and audiogram.  Treatment for this condition depends on the cause. In many cases, the fluid goes away on its own. This information is not intended to replace advice given to you by your health care provider. Make sure you discuss any questions you have with your health care  provider. Document Released: 01/08/2004 Document Revised: 09/09/2016 Document Reviewed: 09/09/2016 Elsevier Interactive Patient Education  2017 [employer]  

## 2018-11-13 MED FILL — LEVOCETIRIZINE 5 MG TABLET: 5 | 30 days supply | Qty: 30 | Fill #0

## 2018-11-15 ENCOUNTER — Ambulatory Visit (INDEPENDENT_AMBULATORY_CARE_PROVIDER_SITE_OTHER): Payer: Self-pay | Admitting: Nurse Practitioner

## 2018-11-15 VITALS — BP 116/76 | HR 85 | Temp 98.3°F | Resp 20 | Wt 190.4 lb

## 2018-11-15 DIAGNOSIS — B9789 Other viral agents as the cause of diseases classified elsewhere: Secondary | ICD-10-CM

## 2018-11-15 DIAGNOSIS — J329 Chronic sinusitis, unspecified: Secondary | ICD-10-CM

## 2018-11-15 MED ORDER — PSEUDOEPH-BROMPHEN-DM 30-2-10 MG/5ML PO SYRP: 5.0000 mL | ORAL_SOLUTION | Freq: Four times a day (QID) | ORAL | 0 refills | Status: AC | PRN

## 2018-11-15 MED ORDER — FLUTICASONE PROPIONATE 50 MCG/ACT NA SUSP: 2.0000 | Freq: Every day | NASAL | 0 refills | Status: DC

## 2018-11-15 MED FILL — FLUTICASONE PROP 50 MCG SPR: 50 | 30 days supply | Qty: 16 | Fill #0

## 2018-11-15 MED FILL — BROMPHENIR-PSEUDOEPHED-DM S: 30-2-10 | 7 days supply | Qty: 150 | Fill #0

## 2018-11-15 NOTE — Progress Notes (Signed)
Subjective:  Michele Houston is a 33 y.o. female who presents for evaluation of possible sinusitis.  Symptoms include headache described as dry cough, nasal congestion and left ear pressure.  Onset of symptoms was 7 days ago, and has been unchanged since that time.  Treatment to date:  Sudafed and Benadryl.  Patient was seen in our office on 10/01/2018 and treated for a middle ear effusion, and seasonal allergies.  Patient was given a prescription for Augmentin at that time.  Patient states she was also prescribed Xyzal, but she has not started taking that medication.  High risk factors for influenza complications:  none.  The following portions of the patient's history were reviewed and updated as appropriate:  allergies, current medications and past medical history.  Constitutional: positive for fatigue, negative for anorexia, chills, fevers and malaise Eyes: negative Ears, nose, mouth, throat, and face: positive for nasal congestion and Rhinorrhea, left ear fullness/pressure, negative for ear drainage, earaches and sore throat Respiratory: positive for cough, negative for asthma, chronic bronchitis, dyspnea on exertion, pneumonia, sputum, stridor and wheezing Cardiovascular: negative Gastrointestinal: positive for diarrhea and Patient has had her gallbladder removed, negative for abdominal pain, nausea and vomiting Neurological: positive for headaches, negative for coordination problems, dizziness, gait problems, paresthesia, tremors, vertigo and weakness Allergic/Immunologic: positive for hay fever   Objective:  BP 116/76 (BP Location: Right Arm, Patient Position: Sitting, Cuff Size: Normal)   Pulse 85   Temp 98.3 F (36.8 C) (Oral)   Resp 20   Wt 190 lb 6.4 oz (86.4 kg)   SpO2 99%   BMI 29.82 kg/m  General appearance: alert, cooperative, fatigued and no distress Head: Normocephalic, without obvious abnormality, atraumatic Eyes: conjunctivae/corneas clear. PERRL, EOM's intact. Fundi  benign. Ears: normal TM's and external ear canals both ears Nose: no discharge, mild congestion, turbinates swollen, inflamed, mild maxillary sinus tenderness bilateral, mild frontal sinus tenderness bilateral Throat: lips, mucosa, and tongue normal; teeth and gums normal Lungs: clear to auscultation bilaterally Heart: regular rate and rhythm, S1, S2 normal, no murmur, click, rub or gallop Abdomen: soft, non-tender; bowel sounds normal; no masses,  no organomegaly Pulses: 2+ and symmetric Skin: Skin color, texture, turgor normal. No rashes or lesions Lymph nodes: cervical and submandibular nodes normal Neurologic: Grossly normal, no cranial nerve deficits    Assessment:  Viral Sinusitis    Plan:   Exam findings, diagnosis etiology and medication use and indications reviewed with patient. Follow- Up and discharge instructions provided. No emergent/urgent issues found on exam.  Based on the patient's clinical presentation, duration of symptoms, and physical assessment, patient's symptoms are congruent with that of a viral etiology.  Patient did not have fever, no purulent nasal drainage, and does not appear to be in any distress or discomfort. I would like to attempt symptomatic treatment for the patient to include Bromfed for the cough and upper respiratory symptoms, Xyzal, and Flonase.  Informed patient that I would like her to try this treatment for at least 5 days.  Informed patient that if her symptoms do not improve within that time, to contact our office and we would consider prescribing and antibiotic.  Discussed with patient that because she has just finished a regimen of Augmentin, I would like to be a little bit more cautious and not over prescribe additional antibiotics at this time.  Also given the fact that the patient has experienced some diarrhea due to her past history of a cholecystectomy would like to avoid antibiotics if possible.  Patient was in agreement with this treatment  plan.  Patient education was provided. Patient verbalized understanding of information provided and agrees with plan of care (POC), all questions answered. The patient is advised to call or return to clinic if condition does not see an improvement in symptoms, or to seek the care of the closest emergency department if condition worsens with the above plan.   1. Viral sinusitis  - brompheniramine-pseudoephedrine-DM 30-2-10 MG/5ML syrup; Take 5 mLs by mouth 4 (four) times daily as needed for up to 7 days.  Dispense: 150 mL; Refill: 0 - fluticasone (FLONASE) 50 MCG/ACT nasal spray; Place 2 sprays into both nostrils daily for 10 days.  Dispense: 16 g; Refill: 0 -Take medication as prescribed. -Ibuprofen or Tylenol for pain, fever, or general discomfort. -Increase fluids.  -Will start Xyzal. -Sleep elevated on at least 2 pillows at bedtime to help with cough. -Use a humidifier or vaporizer when at home and during sleep to help with nasal congestion. -May use a teaspoon of honey or over-the-counter cough drops to help with cough. -May use normal saline nasal spray to help with nasal congestion throughout the day. -Follow-up in our office if there is no improvement in symptoms within the next 3 to 5 days. -Follow-up if symptoms do not improve.

## 2018-11-15 NOTE — Patient Instructions (Addendum)
Sinusitis, Adult -Take medication as prescribed. -Ibuprofen or Tylenol for pain, fever, or general discomfort. -Increase fluids.  -Will start Xyzal. -Sleep elevated on at least 2 pillows at bedtime to help with cough. -Use a humidifier or vaporizer when at home and during sleep to help with nasal congestion. -May use a teaspoon of honey or over-the-counter cough drops to help with cough. -May use normal saline nasal spray to help with nasal congestion throughout the day. -Follow-up in our office if there is no improvement in symptoms within the next 3 to 5 days. -Follow-up if symptoms do not improve.   Sinusitis is inflammation of your sinuses. Sinuses are hollow spaces in the bones around your face. Your sinuses are located:  Around your eyes.  In the middle of your forehead.  Behind your nose.  In your cheekbones. Mucus normally drains out of your sinuses. When your nasal tissues become inflamed or swollen, mucus can become trapped or blocked. This allows bacteria, viruses, and fungi to grow, which leads to infection. Most infections of the sinuses are caused by a virus. Sinusitis can develop quickly. It can last for up to 4 weeks (acute) or for more than 12 weeks (chronic). Sinusitis often develops after a cold. What are the causes? This condition is caused by anything that creates swelling in the sinuses or stops mucus from draining. This includes:  Allergies.  Asthma.  Infection from bacteria or viruses.  Deformities or blockages in your nose or sinuses.  Abnormal growths in the nose (nasal polyps).  Pollutants, such as chemicals or irritants in the air.  Infection from fungi (rare). What increases the risk? You are more likely to develop this condition if you:  Have a weak body defense system (immune system).  Do a lot of swimming or diving.  Overuse nasal sprays.  Smoke. What are the signs or symptoms? The main symptoms of this condition are pain and a  feeling of pressure around the affected sinuses. Other symptoms include:  Stuffy nose or congestion.  Thick drainage from your nose.  Swelling and warmth over the affected sinuses.  Headache.  Upper toothache.  A cough that may get worse at night.  Extra mucus that collects in the throat or the back of the nose (postnasal drip).  Decreased sense of smell and taste.  Fatigue.  A fever.  Sore throat.  Bad breath. How is this diagnosed? This condition is diagnosed based on:  Your symptoms.  Your medical history.  A physical exam.  Tests to find out if your condition is acute or chronic. This may include: ? Checking your nose for nasal polyps. ? Viewing your sinuses using a device that has a light (endoscope). ? Testing for allergies or bacteria. ? Imaging tests, such as an MRI or CT scan. In rare cases, a bone biopsy may be done to rule out more serious types of fungal sinus disease. How is this treated? Treatment for sinusitis depends on the cause and whether your condition is chronic or acute.  If caused by a virus, your symptoms should go away on their own within 10 days. You may be given medicines to relieve symptoms. They include: ? Medicines that shrink swollen nasal passages (topical intranasal decongestants). ? Medicines that treat allergies (antihistamines). ? A spray that eases inflammation of the nostrils (topical intranasal corticosteroids). ? Rinses that help get rid of thick mucus in your nose (nasal saline washes).  If caused by bacteria, your health care provider may recommend waiting to  see if your symptoms improve. Most bacterial infections will get better without antibiotic medicine. You may be given antibiotics if you have: ? A severe infection. ? A weak immune system.  If caused by narrow nasal passages or nasal polyps, you may need to have surgery. Follow these instructions at home: Medicines  Take, use, or apply over-the-counter and  prescription medicines only as told by your health care provider. These may include nasal sprays.  If you were prescribed an antibiotic medicine, take it as told by your health care provider. Do not stop taking the antibiotic even if you start to feel better. Hydrate and humidify   Drink enough fluid to keep your urine pale yellow. Staying hydrated will help to thin your mucus.  Use a cool mist humidifier to keep the humidity level in your home above 50%.  Inhale steam for 10-15 minutes, 3-4 times a day, or as told by your health care provider. You can do this in the bathroom while a hot shower is running.  Limit your exposure to cool or dry air. Rest  Rest as much as possible.  Sleep with your head raised (elevated).  Make sure you get enough sleep each night. General instructions   Apply a warm, moist washcloth to your face 3-4 times a day or as told by your health care provider. This will help with discomfort.  Wash your hands often with soap and water to reduce your exposure to germs. If soap and water are not available, use hand sanitizer.  Do not smoke. Avoid being around people who are smoking (secondhand smoke).  Keep all follow-up visits as told by your health care provider. This is important. Contact a health care provider if:  You have a fever.  Your symptoms get worse.  Your symptoms do not improve within 10 days. Get help right away if:  You have a severe headache.  You have persistent vomiting.  You have severe pain or swelling around your face or eyes.  You have vision problems.  You develop confusion.  Your neck is stiff.  You have trouble breathing. Summary  Sinusitis is soreness and inflammation of your sinuses. Sinuses are hollow spaces in the bones around your face.  This condition is caused by nasal tissues that become inflamed or swollen. The swelling traps or blocks the flow of mucus. This allows bacteria, viruses, and fungi to grow,  which leads to infection.  If you were prescribed an antibiotic medicine, take it as told by your health care provider. Do not stop taking the antibiotic even if you start to feel better.  Keep all follow-up visits as told by your health care provider. This is important. This information is not intended to replace advice given to you by your health care provider. Make sure you discuss any questions you have with your health care provider. Document Released: 10/18/2005 Document Revised: 03/20/2018 Document Reviewed: 03/20/2018 Elsevier Interactive Patient Education  2019 [employer].

## 2018-11-17 ENCOUNTER — Telehealth: Payer: Self-pay

## 2018-11-17 NOTE — Telephone Encounter (Signed)
After I spoke with the provider, I called the patient and let her know the provider wants her to wait until Monday 11/20/2018 if she do not develop any fever, if not patient can call again or be seeing again by [place].

## 2018-11-17 NOTE — Telephone Encounter (Signed)
Patient states she is have sinus problem still and migraine today.

## 2018-12-06 ENCOUNTER — Ambulatory Visit (INDEPENDENT_AMBULATORY_CARE_PROVIDER_SITE_OTHER): Payer: No Typology Code available for payment source | Admitting: Family Medicine

## 2018-12-06 ENCOUNTER — Encounter: Payer: Self-pay | Admitting: Family Medicine

## 2018-12-06 VITALS — BP 110/70 | HR 72 | Temp 97.5°F | Ht 67.0 in | Wt 194.4 lb

## 2018-12-06 DIAGNOSIS — F419 Anxiety disorder, unspecified: Secondary | ICD-10-CM | POA: Insufficient documentation

## 2018-12-06 DIAGNOSIS — B9789 Other viral agents as the cause of diseases classified elsewhere: Secondary | ICD-10-CM

## 2018-12-06 DIAGNOSIS — F32A Depression, unspecified: Secondary | ICD-10-CM | POA: Insufficient documentation

## 2018-12-06 DIAGNOSIS — Z975 Presence of (intrauterine) contraceptive device: Secondary | ICD-10-CM | POA: Insufficient documentation

## 2018-12-06 DIAGNOSIS — F329 Major depressive disorder, single episode, unspecified: Secondary | ICD-10-CM | POA: Insufficient documentation

## 2018-12-06 DIAGNOSIS — J329 Chronic sinusitis, unspecified: Secondary | ICD-10-CM

## 2018-12-06 DIAGNOSIS — Z23 Encounter for immunization: Secondary | ICD-10-CM | POA: Diagnosis not present

## 2018-12-06 DIAGNOSIS — K219 Gastro-esophageal reflux disease without esophagitis: Secondary | ICD-10-CM

## 2018-12-06 DIAGNOSIS — F324 Major depressive disorder, single episode, in partial remission: Secondary | ICD-10-CM | POA: Diagnosis not present

## 2018-12-06 DIAGNOSIS — J302 Other seasonal allergic rhinitis: Secondary | ICD-10-CM

## 2018-12-06 MED ORDER — BUPROPION HCL ER (SR) 150 MG PO TB12: 150.0000 mg | ORAL_TABLET | Freq: Every day | ORAL | 3 refills | Status: DC

## 2018-12-06 MED ORDER — LEVOCETIRIZINE DIHYDROCHLORIDE 5 MG PO TABS: 5.0000 mg | ORAL_TABLET | Freq: Every evening | ORAL | 3 refills | Status: DC

## 2018-12-06 MED ORDER — FLUTICASONE PROPIONATE 50 MCG/ACT NA SUSP: 2.0000 | Freq: Every day | NASAL | 3 refills | Status: DC

## 2018-12-06 MED ORDER — OMEPRAZOLE 20 MG PO CPDR: 20.0000 mg | DELAYED_RELEASE_CAPSULE | Freq: Every day | ORAL | 1 refills | Status: DC

## 2018-12-06 NOTE — Progress Notes (Deleted)
Patient: Michele Houston MRN: [id number] DOB: 1986/02/02 PCP: Orland Mustard, MD     Subjective:  No chief complaint on file.   HPI: The patient is a 33 y.o. female who presents today for annual exam. {He/she (caps):30048} denies any changes to past medical history. There have been no recent hospitalizations. They {Actions; are/are not:16769} following a well balanced diet and exercise plan. Weight has been {trend:16658}. No complaints today.    There is no immunization history on file for this patient. Colonoscopy: Mammogram:  Pap smear:  PSA:   Review of Systems  Allergies Patient is allergic to band-aid plus antibiotic [bacitracin-polymyxin b] and ceclor [cefaclor].  Past Medical History Patient  has a past medical history of Anemia and GERD (gastroesophageal reflux disease).  Surgical History Patient  has a past surgical history that includes tubes in ears (baby); Knee surgery (Left, 2011); and Cholecystectomy (N/A, 06/03/2015).  Family History Pateint's family history is not on file.  Social History Patient  reports that she quit smoking about 7 years ago. Her smoking use included cigarettes. She has a 4.00 pack-year smoking history. She has never used smokeless tobacco. She reports that she does not drink alcohol or use drugs.    Objective: There were no vitals filed for this visit.  There is no height or weight on file to calculate BMI.  Physical Exam     Assessment/plan:   No problem-specific Assessment & Plan notes found for this encounter.    No follow-ups on file.     Orland Mustard, MD [facility]  12/06/2018
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
## 2018-12-20 ENCOUNTER — Encounter: Payer: Self-pay | Admitting: Family Medicine

## 2018-12-20 ENCOUNTER — Encounter: Payer: No Typology Code available for payment source | Admitting: Family Medicine

## 2018-12-22 MED FILL — LEVOCETIRIZINE 5 MG TABLET: 5 | 90 days supply | Qty: 90 | Fill #0

## 2018-12-22 MED FILL — OMEPRAZOLE 20 MG CPDR: 20 | 90 days supply | Qty: 90 | Fill #0

## 2018-12-22 MED FILL — BUPROPION HCL SR 150 MG TAB: 150 | 90 days supply | Qty: 90 | Fill #0

## 2018-12-29 ENCOUNTER — Encounter: Payer: Self-pay | Admitting: Family Medicine

## 2018-12-29 DIAGNOSIS — G43909 Migraine, unspecified, not intractable, without status migrainosus: Secondary | ICD-10-CM | POA: Insufficient documentation

## 2019-01-12 ENCOUNTER — Encounter: Payer: No Typology Code available for payment source | Admitting: Family Medicine

## 2019-02-14 ENCOUNTER — Encounter: Payer: No Typology Code available for payment source | Admitting: Family Medicine

## 2019-03-02 ENCOUNTER — Encounter: Payer: Self-pay | Admitting: Family Medicine

## 2019-03-02 ENCOUNTER — Ambulatory Visit (INDEPENDENT_AMBULATORY_CARE_PROVIDER_SITE_OTHER): Payer: No Typology Code available for payment source | Admitting: Family Medicine

## 2019-03-02 ENCOUNTER — Other Ambulatory Visit: Payer: Self-pay

## 2019-03-02 VITALS — Ht 67.0 in | Wt 194.0 lb

## 2019-03-02 DIAGNOSIS — H9202 Otalgia, left ear: Secondary | ICD-10-CM | POA: Diagnosis not present

## 2019-03-02 DIAGNOSIS — R253 Fasciculation: Secondary | ICD-10-CM

## 2019-03-02 MED ORDER — BACLOFEN 10 MG PO TABS: 10.0000 mg | ORAL_TABLET | Freq: Three times a day (TID) | ORAL | 1 refills | Status: DC

## 2019-03-02 MED FILL — BACLOFEN 10 MG TABS: 10 | 10 days supply | Qty: 30 | Fill #0

## 2019-03-02 NOTE — Progress Notes (Signed)
Patient: Michele DoneKrista R Wix MRN: [id number] DOB: 07/07/1986 PCP: Orland MustardWolfe, Melvin Marmo, MD     I connected with Michele [place] on 03/02/19 at  by a video enabled telemedicine application and verified that I am speaking with the correct person using two identifiers.  Location patient: Home Location provider: [facility], Office Persons participating in this virtual visit: Ronnald NianKrista Babineaux and Dr. Artis FlockWolfe   I discussed the limitations of evaluation and management by telemedicine and the availability of in person appointments. The patient expressed understanding and agreed to proceed.   Subjective:  Chief Complaint  Patient presents with  . R eye twitching  . L ear hurts    HPI: The patient is a 33 y.o. female who presents today for right eye twitching x 2 weeks and her left ear hurting. She states her left ear hurting started today. It is rated a 3/10. Denies any tooth pain, dizziness, h/a, drainage, left jaw pain, hearing loss, tinnitus, enlarged lymph nodes or pain with chewing. No fever/chills or sore throat. Has not used her flonase or taken ibuprofen.   Her eye twitch is in the right eye and is constant. Upper eyelid. Started 2 weeks ago, but has really been bothering her the past 2 days. She has no vision issues. No double vision, blurry vision, nystagmus. No ocular motor issues. She denies any other muscle twitching in her body. She is more stressed and anxious with the covid. Looks at computer screen a lot. Does state she still has twitch at home over the weekend when not working.   Review of Systems  Constitutional: Negative for chills, fatigue and fever.  HENT: Positive for ear pain. Negative for congestion, postnasal drip, rhinorrhea, sinus pressure, sinus pain, sore throat and trouble swallowing.        C/o left ear pain  Eyes: Negative for pain and visual disturbance.       C/o right eye twitching x 2 wks, worse in the past 2 days  Respiratory: Negative for chest tightness and shortness of  breath.   Cardiovascular: Negative for chest pain.  Gastrointestinal: Negative for abdominal pain and nausea.  Neurological: Negative for dizziness, tremors, facial asymmetry, weakness, numbness and headaches.  Psychiatric/Behavioral: Negative for sleep disturbance.    Allergies Patient is allergic to ceclor [cefaclor].  Past Medical History Patient  has a past medical history of Anemia and GERD (gastroesophageal reflux disease).  Surgical History Patient  has a past surgical history that includes tubes in ears (baby); Knee surgery (Left, 2011); and Cholecystectomy (N/A, 06/03/2015).  Family History Pateint's family history is not on file.  Social History Patient  reports that she quit smoking about 7 years ago. Her smoking use included cigarettes. She has a 4.00 pack-year smoking history. She has never used smokeless tobacco. She reports that she does not drink alcohol or use drugs.    Objective: Vitals:   03/02/19 1331  Weight: 194 lb (88 kg)  Height: 5\' 7"  (1.702 m)    Body mass index is 30.38 kg/m.  Physical Exam Vitals signs reviewed.  Constitutional:      Appearance: Normal appearance.  HENT:     Head: Normocephalic and atraumatic.  Eyes:     Extraocular Movements: Extraocular movements intact.  Pulmonary:     Effort: Pulmonary effort is normal.  Neurological:     General: No focal deficit present.     Mental Status: She is alert and oriented to person, place, and time.  Psychiatric:  Mood and Affect: Mood normal.        Behavior: Behavior normal.        Assessment/plan: 1. Eyelid twitch No other concerning neurological symptoms. Discussed limited without being able to examine her. Likely secondary to stress. Will hopefully go away over time. Will give her low dose muscle relaxer, but discussed this is not really evidence based as we don't know exactly what causes these idiopathic twitches. Drowsy precautions given. If not getting better, any ocular  abnormalities or other neurological symptoms we need to see her and do a further work up.   2. Left ear pain No red flag symptoms at this time. We are going to have her start her flonase back up and take ibuprofen prn. If not better on Monday, she will let me know.     Return if symptoms worsen or fail to improve.    Orland Mustard, MD [facility]  03/02/2019

## 2019-03-06 ENCOUNTER — Encounter: Payer: Self-pay | Admitting: Family Medicine

## 2019-03-14 ENCOUNTER — Encounter: Payer: Self-pay | Admitting: Family Medicine

## 2019-03-19 ENCOUNTER — Encounter: Payer: Self-pay | Admitting: Family Medicine

## 2019-03-20 MED FILL — OMEPRAZOLE 20 MG CPDR: 20 | 90 days supply | Qty: 90 | Fill #1

## 2019-03-20 MED FILL — LEVOCETIRIZINE 5 MG TABLET: 5 | 90 days supply | Qty: 90 | Fill #1

## 2019-03-20 MED FILL — BUPROPION HCL SR 150 MG TAB: 150 | 90 days supply | Qty: 90 | Fill #1

## 2019-03-29 ENCOUNTER — Encounter: Payer: Self-pay | Admitting: Family Medicine

## 2019-03-29 ENCOUNTER — Other Ambulatory Visit: Payer: Self-pay

## 2019-03-29 ENCOUNTER — Other Ambulatory Visit (HOSPITAL_COMMUNITY)
Admission: RE | Admit: 2019-03-29 | Discharge: 2019-03-29 | Disposition: A | Discharge status: Home / Self Care | Payer: No Typology Code available for payment source | Source: Ambulatory Visit | Attending: Family Medicine | Admitting: Family Medicine

## 2019-03-29 ENCOUNTER — Ambulatory Visit (INDEPENDENT_AMBULATORY_CARE_PROVIDER_SITE_OTHER): Payer: No Typology Code available for payment source | Admitting: Family Medicine

## 2019-03-29 VITALS — BP 116/70 | HR 68 | Temp 98.1°F | Ht 67.0 in | Wt 200.8 lb

## 2019-03-29 DIAGNOSIS — K219 Gastro-esophageal reflux disease without esophagitis: Secondary | ICD-10-CM

## 2019-03-29 DIAGNOSIS — Z01419 Encounter for gynecological examination (general) (routine) without abnormal findings: Secondary | ICD-10-CM | POA: Diagnosis not present

## 2019-03-29 DIAGNOSIS — Z114 Encounter for screening for human immunodeficiency virus [HIV]: Secondary | ICD-10-CM | POA: Diagnosis not present

## 2019-03-29 DIAGNOSIS — N888 Other specified noninflammatory disorders of cervix uteri: Secondary | ICD-10-CM

## 2019-03-29 DIAGNOSIS — Z0000 Encounter for general adult medical examination without abnormal findings: Secondary | ICD-10-CM | POA: Diagnosis not present

## 2019-03-29 LAB — COMPREHENSIVE METABOLIC PANEL
ALT: 36 U/L — ABNORMAL HIGH (ref 0–35)
AST: 29 U/L (ref 0–37)
Albumin: 4.5 g/dL (ref 3.5–5.2)
Alkaline Phosphatase: 72 U/L (ref 39–117)
BUN: 12 mg/dL (ref 6–23)
CO2: 29 mEq/L (ref 19–32)
Calcium: 9.9 mg/dL (ref 8.4–10.5)
Chloride: 102 mEq/L (ref 96–112)
Creatinine, Ser: 1.1 mg/dL (ref 0.40–1.20)
GFR: 58 mL/min — ABNORMAL LOW (ref >60.00)
Glucose, Bld: 80 mg/dL (ref 70–99)
Potassium: 4.5 mEq/L (ref 3.5–5.1)
Sodium: 140 mEq/L (ref 135–145)
Total Bilirubin: 0.5 mg/dL (ref 0.2–1.2)
Total Protein: 7.1 g/dL (ref 6.0–8.3)

## 2019-03-29 LAB — LIPID PANEL
Cholesterol: 198 mg/dL (ref 0–200)
HDL: 75.5 mg/dL (ref >39.00)
LDL Cholesterol: 100 mg/dL — ABNORMAL HIGH (ref 0–99)
NonHDL: 122.45
Total CHOL/HDL Ratio: 3
Triglycerides: 111 mg/dL (ref 0.0–149.0)
VLDL: 22.2 mg/dL (ref 0.0–40.0)

## 2019-03-29 LAB — CBC WITH DIFFERENTIAL/PLATELET
Basophils Absolute: 0 10*3/uL (ref 0.0–0.1)
Basophils Relative: 0.6 % (ref 0.0–3.0)
Eosinophils Absolute: 0.1 10*3/uL (ref 0.0–0.7)
Eosinophils Relative: 2.1 % (ref 0.0–5.0)
HCT: 41.9 % (ref 36.0–46.0)
Hemoglobin: 14.2 g/dL (ref 12.0–15.0)
Lymphocytes Relative: 32.6 % (ref 12.0–46.0)
Lymphs Abs: 1.6 10*3/uL (ref 0.7–4.0)
MCHC: 34 g/dL (ref 30.0–36.0)
MCV: 83.9 fl (ref 78.0–100.0)
Monocytes Absolute: 0.3 10*3/uL (ref 0.1–1.0)
Monocytes Relative: 6.4 % (ref 3.0–12.0)
Neutro Abs: 2.9 10*3/uL (ref 1.4–7.7)
Neutrophils Relative %: 58.3 % (ref 43.0–77.0)
Platelets: 311 10*3/uL (ref 150.0–400.0)
RBC: 4.99 Mil/uL (ref 3.87–5.11)
RDW: 13.8 % (ref 11.5–15.5)
WBC: 5 10*3/uL (ref 4.0–10.5)

## 2019-03-29 LAB — TSH: TSH: 1.14 u[IU]/mL (ref 0.35–4.50)

## 2019-03-29 NOTE — Patient Instructions (Signed)
Nabothian cyst on cervixt. Sending to gyn just to make sure and make sure not cause of pain with sex since so big. They are benign.   Pap smear and BV testing  Labs/h.pylori breath test.

## 2019-03-29 NOTE — Progress Notes (Signed)
Patient: Michele DoneKrista R Benevides MRN: [id number] DOB: 01/07/1986 PCP: Orland MustardWolfe, Annamarie Yamaguchi, MD     Subjective:  Chief Complaint  Patient presents with  . Annual Exam  . Gynecologic Exam    HPI: The patient is a 33 y.o. female who presents today for annual exam. She denies any changes to past medical history. There have been no recent hospitalizations. They are following a well balanced diet and exercise plan. Weight has been stable. No complaints today. No history of breast or colon cancer in her family. Her dad was adopted and doesn't know any family history.    Well woman exam: She is here for regular pap follow up. She believes menarche started around age 33 years. She states periods were regular. She has a paraguard in place that was placed in 2018 and has regular periods with this. She has them every 28 days. Cycles are usually normal with one heavy day. No history of abnormal pap smears. No history of STDs. She has occasional pain with sex which she thinks is due to the paraguard. Pain is more in her suprapubic area, not on her cervix. Sometimes has to change positions.  She has a lot of vaginal discharge. It is odorless, but as day goes on it does have a smell and can be green at times. Has had BV in the past and went away for a while after she took medication, but feels like it may have come back. No vaginal itching, frequent UTIs. Denies any breast masses, nipples discharge or pain.  No FH of breast/colon cancer.   Also needing the h.pylori test today. She stopped her medication over 2 weeks ago to take the test. She has a past history of international travel and terrible GERD.    Immunization History  Administered Date(s) Administered  . Influenza-Unspecified 07/22/2017  . PPD Test 07/30/2017  . Tdap 12/06/2018   Colonoscopy: routine screening Mammogram: routine screening Pap smear: today  hiv today Tdap: utd   Review of Systems  Constitutional: Negative for chills, fatigue, fever and  unexpected weight change.  HENT: Negative for dental problem, ear pain, hearing loss and trouble swallowing.   Eyes: Negative for visual disturbance.  Respiratory: Negative for cough, chest tightness and shortness of breath.   Cardiovascular: Negative for chest pain, palpitations and leg swelling.  Gastrointestinal: Negative for abdominal pain, blood in stool, diarrhea and nausea.  Endocrine: Negative for cold intolerance, polydipsia, polyphagia and polyuria.  Genitourinary: Negative.  Negative for dysuria, hematuria, pelvic pain, vaginal bleeding and vaginal pain.  Musculoskeletal: Negative for arthralgias, back pain and neck pain.  Skin: Negative.  Negative for rash.  Neurological: Positive for headaches. Negative for dizziness.  Psychiatric/Behavioral: Negative for dysphoric mood and sleep disturbance. The patient is not nervous/anxious.     Allergies Patient is allergic to ceclor [cefaclor].  Past Medical History Patient  has a past medical history of Anemia and GERD (gastroesophageal reflux disease).  Surgical History Patient  has a past surgical history that includes tubes in ears (baby); Knee surgery (Left, 2011); and Cholecystectomy (N/A, 06/03/2015).  Family History Pateint's family history is not on file.  Social History Patient  reports that she quit smoking about 7 years ago. Her smoking use included cigarettes. She has a 4.00 pack-year smoking history. She has never used smokeless tobacco. She reports that she does not drink alcohol or use drugs.    Objective: Vitals:   03/29/19 0800  BP: 116/70  Pulse: 68  Temp: 98.1 F (36.7 C)  TempSrc: Oral  SpO2: 99%  Weight: 200 lb 12.8 oz (91.1 kg)  Height: 5\' 7"  (1.702 m)    Body mass index is 31.45 kg/m.  Physical Exam Vitals signs reviewed. Exam conducted with a chaperone present.  Constitutional:      Appearance: Normal appearance.  HENT:     Head: Normocephalic and atraumatic.     Right Ear: Tympanic membrane,  ear canal and external ear normal.     Left Ear: Tympanic membrane, ear canal and external ear normal.     Nose: Nose normal.     Mouth/Throat:     Mouth: Mucous membranes are dry.  Eyes:     Extraocular Movements: Extraocular movements intact.     Pupils: Pupils are equal, round, and reactive to light.  Neck:     Musculoskeletal: Normal range of motion.  Cardiovascular:     Rate and Rhythm: Normal rate and regular rhythm.     Pulses: Normal pulses.  Pulmonary:     Effort: Pulmonary effort is normal.     Breath sounds: Normal breath sounds.  Chest:     Breasts: Breasts are symmetrical.        Right: No tenderness.        Left: Normal. No tenderness.  Abdominal:     General: Abdomen is flat. Bowel sounds are normal. There is no distension.     Palpations: Abdomen is soft. There is no mass.     Tenderness: There is no abdominal tenderness.  Genitourinary:    General: Normal vulva.     Vagina: Vaginal discharge present.     Rectum: Normal.     Comments: 2 cervical cysts, quite large at 11:00 and 7:00 o'clock. No CMT and no adnexal TTP.  -vaginal discharge that is brown in color. -paraguard strings visible.  Musculoskeletal: Normal range of motion.  Lymphadenopathy:     Upper Body:     Right upper body: No supraclavicular or axillary adenopathy.     Left upper body: No supraclavicular or axillary adenopathy.  Skin:    General: Skin is warm.     Capillary Refill: Capillary refill takes less than 2 seconds.  Neurological:     General: No focal deficit present.     Mental Status: She is alert and oriented to person, place, and time.  Psychiatric:        Mood and Affect: Mood normal.        Behavior: Behavior normal.        Assessment/plan: 1. Annual physical exam Routine lab work. Doing well. Already seen for depression in past few months and well controlled. Exercise/healthy diet. Well woman today. HM utd. F/u in one year or as needed.  Patient counseling [x]     Nutrition: Stressed importance of moderation in sodium/caffeine intake, saturated fat and cholesterol, caloric balance, sufficient intake of fresh fruits, vegetables, fiber, calcium, iron, and 1 mg of folate supplement per day (for females capable of pregnancy).  [x]    Stressed the importance of regular exercise.   []    Substance Abuse: Discussed cessation/primary prevention of tobacco, alcohol, or other drug use; driving or other dangerous activities under the influence; availability of treatment for abuse.   [x]    Injury prevention: Discussed safety belts, safety helmets, smoke detector, smoking near bedding or upholstery.   [x]    Sexuality: Discussed sexually transmitted diseases, partner selection, use of condoms, avoidance of unintended pregnancy  and contraceptive alternatives.  [x]    Dental health: Discussed importance of regular tooth  brushing, flossing, and dental visits.     Health maintenance and immunizations reviewed. Please refer to Health maintenance section.    - CBC with Differential/Platelet - Comprehensive metabolic panel - TSH - Lipid panel  2. Well woman exam with routine gynecological exam Pap/hpv, BV testing. Declines STD testing. No desire to remove paraguard. [place] with having kids.  - Cytology - PAP( [place]) - Cervicovaginal ancillary only( [place])  3. Gastroesophageal reflux disease, esophagitis presence not specified Hx of international travel. Stopped GERD medication 2 weeks ago for h.pylori testing today.  - H. pylori breath test  4. Encounter for screening for HIV  - HIV Antibody (routine testing w rflx)  5. Cyst of cervix Appear to be nabothian cysts, but due to size and pain with sex will have GYN evaluate and determine if biopsy needed.  - Ambulatory referral to Gynecology    Return in about 1 year (around 03/28/2020).     Orland Mustard, MD [place] [facility]  03/29/2019

## 2019-03-30 LAB — CYTOLOGY - PAP
Diagnosis: NEGATIVE
HPV: NOT DETECTED

## 2019-03-30 LAB — HIV ANTIBODY (ROUTINE TESTING W REFLEX): HIV 1&2 Ab, 4th Generation: NONREACTIVE

## 2019-03-30 LAB — CERVICOVAGINAL ANCILLARY ONLY
Bacterial vaginitis: NEGATIVE
Candida vaginitis: NEGATIVE

## 2019-03-30 LAB — H. PYLORI BREATH TEST: H. pylori Breath Test: NOT DETECTED

## 2019-04-01 ENCOUNTER — Encounter: Payer: Self-pay | Admitting: Family Medicine

## 2019-04-04 ENCOUNTER — Encounter: Payer: Self-pay | Admitting: Obstetrics and Gynecology

## 2019-04-16 ENCOUNTER — Other Ambulatory Visit: Payer: Self-pay

## 2019-04-18 ENCOUNTER — Other Ambulatory Visit: Payer: Self-pay

## 2019-04-18 ENCOUNTER — Ambulatory Visit (INDEPENDENT_AMBULATORY_CARE_PROVIDER_SITE_OTHER): Payer: No Typology Code available for payment source | Admitting: Obstetrics and Gynecology

## 2019-04-18 ENCOUNTER — Encounter: Payer: Self-pay | Admitting: Obstetrics and Gynecology

## 2019-04-18 VITALS — BP 116/64 | HR 80 | Temp 97.2°F | Resp 14 | Ht 67.0 in | Wt 201.0 lb

## 2019-04-18 DIAGNOSIS — N760 Acute vaginitis: Secondary | ICD-10-CM | POA: Diagnosis not present

## 2019-04-18 DIAGNOSIS — R102 Pelvic and perineal pain: Secondary | ICD-10-CM | POA: Diagnosis not present

## 2019-04-18 DIAGNOSIS — N941 Unspecified dyspareunia: Secondary | ICD-10-CM

## 2019-04-18 DIAGNOSIS — N888 Other specified noninflammatory disorders of cervix uteri: Secondary | ICD-10-CM

## 2019-04-18 NOTE — Progress Notes (Signed)
GYNECOLOGY  VISIT   HPI: 33 y.o.   Married  Caucasian  female   G3P0003 with Patient's last menstrual period was 03/24/2019.   here as a new patient for cysts on the cervix. Referred from Orma Flaming, MD.     Wonders if the cysts are coming from her IUD.   States she has a lot of vaginal discharge and needs a panty liner all day.  Husband is able to feel the cyst with his finger. Patient does not have pain with intercourse.  It is not painful to the touch.   She notices vaginal odor.  She is using boric acid capsules which helps for a day.   States she can feel a gas bubble in her lower abdomen with intercourse.  May occur more often prior to her menses starting.  Position does not make a difference.   Can feel pain radiating down into her thighs bilaterally.   She has a history of Mirena IUD use.  She transitioned to [facility] fairly quickly after removing the Mirena.  She had constant stabbing pain which resolved with Mirena removal.   Negative GC/CT testing on 03/29/19.  Negative BV and yeast.   Not certain if they would like to have further childbearing.   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 5, 9, and 46 yo children.   GYNECOLOGIC HISTORY: Patient's last menstrual period was 03/24/2019. Contraception:  Paragard IUD inserted July 2018 Menopausal hormone therapy:  n/a Last mammogram:  n/a Last pap smear:   03/29/19 Neg:Neg HR HPV        OB History    Gravida  3   Para  3   Term  0   Preterm  0   AB  0   Living  3     SAB  0   TAB  0   Ectopic  0   Multiple  0   Live Births  0        Obstetric Comments  1st Menstrual Cycle: 13 1st Pregnancy:  20            Patient Active Problem List   Diagnosis Date Noted  . Migraine 12/29/2018  . IUD (intrauterine device) in place 12/06/2018  . Depression 12/06/2018  . Anxiety 12/06/2018    Past Medical History:  Diagnosis Date  . Anemia    when pregnant only  . GERD (gastroesophageal  reflux disease)     Past Surgical History:  Procedure Laterality Date  . CHOLECYSTECTOMY N/A 06/03/2015   Procedure: LAPAROSCOPIC CHOLECYSTECTOMY WITH INTRAOPERATIVE CHOLANGIOGRAM;  Surgeon: Robert Bellow, MD;  Location: [facility] ORS;  Service: General;  Laterality: N/A;  . KNEE SURGERY Left 2011   arthroscopy  . tubes in ears  baby    Current Outpatient Medications  Medication Sig Dispense Refill  . buPROPion (WELLBUTRIN SR) 150 MG 12 hr tablet Take 1 tablet (150 mg total) by mouth daily. 90 tablet 3  . fluticasone (FLONASE) 50 MCG/ACT nasal spray Place 2 sprays into both nostrils daily. 16 g 3  . levocetirizine (XYZAL) 5 MG tablet Take 1 tablet (5 mg total) by mouth every evening. 90 tablet 3  . omeprazole (PRILOSEC) 20 MG capsule Take 1 capsule (20 mg total) by mouth daily. 90 capsule 1  . sodium chloride (OCEAN) 0.65 % SOLN nasal spray Place 1 spray into both nostrils as needed for congestion.    . baclofen (LIORESAL) 10 MG tablet Take 1 tablet (10 mg total) by mouth  3 (three) times daily. (Patient not taking: Reported on 04/18/2019) 30 each 1   No current facility-administered medications for this visit.      ALLERGIES: Ceclor [cefaclor]  Family History  Problem Relation Age of Onset  . Hyperthyroidism Mother   . Depression Mother   . Lung cancer Father   . Depression Father   . Lung cancer Paternal Uncle   . Lung cancer Paternal Grandmother     Social History   Socioeconomic History  . Marital status: Married    Spouse name: Not on file  . Number of children: Not on file  . Years of education: Not on file  . Highest education level: Not on file  Occupational History  . Not on file  Social Needs  . Financial resource strain: Not on file  . Food insecurity    Worry: Not on file    Inability: Not on file  . Transportation needs    Medical: Not on file    Non-medical: Not on file  Tobacco Use  . Smoking status: Former Smoker    Packs/day: 1.00    Years: 4.00     Pack years: 4.00    Types: Cigarettes    Quit date: 05/30/2011    Years since quitting: 7.8  . Smokeless tobacco: Never Used  Substance and Sexual Activity  . Alcohol use: Not Currently    Alcohol/week: 0.0 standard drinks  . Drug use: Never  . Sexual activity: Yes    Birth control/protection: I.U.D.    Comment: Paragard inserted July 2018  Lifestyle  . Physical activity    Days per week: Not on file    Minutes per session: Not on file  . Stress: Not on file  Relationships  . Social Musicianconnections    Talks on phone: Not on file    Gets together: Not on file    Attends religious service: Not on file    Active member of club or organization: Not on file    Attends meetings of clubs or organizations: Not on file    Relationship status: Not on file  . Intimate partner violence    Fear of current or ex partner: Not on file    Emotionally abused: Not on file    Physically abused: Not on file    Forced sexual activity: Not on file  Other Topics Concern  . Not on file  Social History Narrative  . Not on file    Review of Systems  Constitutional: Negative.   HENT: Negative.   Eyes: Negative.   Respiratory: Negative.   Cardiovascular: Negative.   Gastrointestinal: Negative.   Endocrine: Negative.   Genitourinary: Positive for vaginal discharge.       Cysts of cervix  Musculoskeletal: Negative.   Skin: Negative.   Allergic/Immunologic: Negative.   Neurological: Negative.   Hematological: Negative.   Psychiatric/Behavioral: Negative.     PHYSICAL EXAMINATION:    BP 116/64 (BP Location: Left Arm, Patient Position: Sitting, Cuff Size: Large)   Pulse 80   Temp (!) 97.2 F (36.2 C) (Temporal)   Resp 14   Ht 5\' 7"  (1.702 m)   Wt 201 lb (91.2 kg)   LMP 03/24/2019   BMI 31.48 kg/m     General appearance: alert, cooperative and appears stated age Head: Normocephalic, without obvious abnormality, atraumatic Lungs: clear to auscultation bilaterally Heart: regular rate and  rhythm Abdomen: soft, non-tender, no masses,  no organomegaly Extremities: extremities normal, atraumatic, no cyanosis or edema Lymph  nodes: Cervical, supraclavicular, and axillary nodes normal. No abnormal inguinal nodes palpated Neurologic: Grossly normal  Pelvic: External genitalia:  no lesions              Urethra:  normal appearing urethra with no masses, tenderness or lesions              Bartholins and Skenes: normal                 Vagina: normal appearing vagina with normal color and discharge, no lesions              Cervix: no lesions.  5 mm nabothian cyst at 8:00 and 10:00.  IUD strings noted.                 Bimanual Exam:  Uterus:  normal size, contour, position, consistency, mobility, non-tender              Adnexa: no mass, fullness, tenderness            Chaperone was present for exam.  ASSESSMENT  Pelvic pain and female dyspareunia. ParaGard IUD. Nabothian cysts.  Vaginal discharge and odor.  Intolerance to hormonal contraceptives.   PLAN  Nuswab.  Return for pelvic US. We did discuss pain due to endometriosis, adhesive disease, or the IUD itself.  Reassurance regarding Nabothian cyst presence.  No drainage or removal needed.    An After Visit Summary was printed and given to the patient.  __30____ minutes face to face time of which over 50% was spent in counseling.

## 2019-04-19 ENCOUNTER — Encounter: Payer: Self-pay | Admitting: Obstetrics and Gynecology

## 2019-04-19 ENCOUNTER — Telehealth: Payer: Self-pay | Admitting: Obstetrics and Gynecology

## 2019-04-19 NOTE — Telephone Encounter (Signed)
Patient sent the following message through [place]. Routing to triage to assist patient with request.   This is regarding my [place] on the 25th. I was reading the appointment and it said days 7-10 in my cycle and no bleeding. I usually start my period between the 23-25th so I will definitely be bleeding. Please call me to reschedule when you get a chance.  Thank you!

## 2019-04-20 NOTE — Telephone Encounter (Signed)
PUS for dyspareunia & pelvic pain.   Spoke with patient. Advised ok to proceed with PUS while on menses if she is comfortable, offered to reschedule. Patient request to keep PUS as scheduled for 6/25 at 11am. Patient verbalizes understanding and thankful for return call.   Encounter closed.

## 2019-04-22 LAB — NUSWAB VAGINITIS (VG)
Candida albicans, NAA: NEGATIVE
Candida glabrata, NAA: NEGATIVE
Trich vag by NAA: NEGATIVE

## 2019-04-24 ENCOUNTER — Other Ambulatory Visit: Payer: Self-pay

## 2019-04-26 ENCOUNTER — Encounter: Payer: Self-pay | Admitting: Obstetrics and Gynecology

## 2019-04-26 ENCOUNTER — Ambulatory Visit: Payer: No Typology Code available for payment source | Admitting: Obstetrics and Gynecology

## 2019-04-26 ENCOUNTER — Ambulatory Visit (INDEPENDENT_AMBULATORY_CARE_PROVIDER_SITE_OTHER): Payer: No Typology Code available for payment source

## 2019-04-26 ENCOUNTER — Other Ambulatory Visit: Payer: Self-pay

## 2019-04-26 VITALS — BP 120/70 | HR 76 | Temp 97.5°F | Resp 12 | Ht 67.0 in | Wt 201.6 lb

## 2019-04-26 DIAGNOSIS — R102 Pelvic and perineal pain unspecified side: Secondary | ICD-10-CM

## 2019-04-26 DIAGNOSIS — Z30431 Encounter for routine checking of intrauterine contraceptive device: Secondary | ICD-10-CM | POA: Diagnosis not present

## 2019-04-26 DIAGNOSIS — N941 Unspecified dyspareunia: Secondary | ICD-10-CM

## 2019-04-26 MED ORDER — NORGESTIM-ETH ESTRAD TRIPHASIC 0.18/0.215/0.25 MG-35 MCG PO TABS: 1.0000 | ORAL_TABLET | Freq: Every day | ORAL | 0 refills | Status: DC

## 2019-04-26 MED FILL — TRI FEMYNOR 28 TABLET: 0.18/0.215/ | 84 days supply | Qty: 84 | Fill #0

## 2019-04-26 NOTE — Progress Notes (Signed)
Encounter reviewed by Dr. Spring [place] C. Silva.  

## 2019-04-26 NOTE — Progress Notes (Signed)
GYNECOLOGY  VISIT   HPI: 33 y.o.   Married  Caucasian  female   G3P0003 with Patient's last menstrual period was 04/24/2019.   here for ultrasound for pelvic pain and dyspareunia.   She was seen recently for a first visit on 04/18/19, and a plan was made to return to investigate her pain further.   She has a ParaGard IUD.   She has been reading a lot about endometriosis.  She states she had chest pain, right arm pain, and increased back pain with her period. States she has a dull ache a week prior to her menses.  She is right handed.   She is interested in birth control pills for treating pain.  States she can remember them now.  Used Trisprintec, and did well with them in about 2009.   Denies HTN, liver or breast disease, migraine with aura, thromboembolic events for self or others in her family.   GYNECOLOGIC HISTORY: Patient's last menstrual period was 04/24/2019. Contraception:  IUD  Menopausal hormone therapy:  n/a Last mammogram:  n/a Last pap smear:   03/29/19 Neg:Neg HR HPV        OB History    Gravida  3   Para  3   Term  0   Preterm  0   AB  0   Living  3     SAB  0   TAB  0   Ectopic  0   Multiple  0   Live Births  0        Obstetric Comments  1st Menstrual Cycle: 13 1st Pregnancy:  20            Patient Active Problem List   Diagnosis Date Noted  . Migraine 12/29/2018  . IUD (intrauterine device) in place 12/06/2018  . Depression 12/06/2018  . Anxiety 12/06/2018    Past Medical History:  Diagnosis Date  . Anemia    when pregnant only  . GERD (gastroesophageal reflux disease)     Past Surgical History:  Procedure Laterality Date  . CHOLECYSTECTOMY N/A 06/03/2015   Procedure: LAPAROSCOPIC CHOLECYSTECTOMY WITH INTRAOPERATIVE CHOLANGIOGRAM;  Surgeon: Earline MayotteJeffrey W Byrnett, MD;  Location: [facility] ORS;  Service: General;  Laterality: N/A;  . KNEE SURGERY Left 2011   arthroscopy  . tubes in ears  baby    Current Outpatient Medications   Medication Sig Dispense Refill  . baclofen (LIORESAL) 10 MG tablet Take 1 tablet (10 mg total) by mouth 3 (three) times daily. 30 each 1  . buPROPion (WELLBUTRIN SR) 150 MG 12 hr tablet Take 1 tablet (150 mg total) by mouth daily. 90 tablet 3  . fluticasone (FLONASE) 50 MCG/ACT nasal spray Place 2 sprays into both nostrils daily. 16 g 3  . levocetirizine (XYZAL) 5 MG tablet Take 1 tablet (5 mg total) by mouth every evening. 90 tablet 3  . omeprazole (PRILOSEC) 20 MG capsule Take 1 capsule (20 mg total) by mouth daily. 90 capsule 1  . sodium chloride (OCEAN) 0.65 % SOLN nasal spray Place 1 spray into both nostrils as needed for congestion.     No current facility-administered medications for this visit.      ALLERGIES: Ceclor [cefaclor]  Family History  Problem Relation Age of Onset  . Hyperthyroidism Mother   . Depression Mother   . Lung cancer Father   . Depression Father   . Lung cancer Paternal Uncle   . Lung cancer Paternal Grandmother   . Cervical cancer Sister   .  Hypertension Brother     Social History   Socioeconomic History  . Marital status: Married    Spouse name: Not on file  . Number of children: Not on file  . Years of education: Not on file  . Highest education level: Not on file  Occupational History  . Not on file  Social Needs  . Financial resource strain: Not on file  . Food insecurity    Worry: Not on file    Inability: Not on file  . Transportation needs    Medical: Not on file    Non-medical: Not on file  Tobacco Use  . Smoking status: Former Smoker    Packs/day: 1.00    Years: 4.00    Pack years: 4.00    Types: Cigarettes    Quit date: 05/30/2011    Years since quitting: 7.9  . Smokeless tobacco: Never Used  Substance and Sexual Activity  . Alcohol use: Not Currently    Alcohol/week: 0.0 standard drinks  . Drug use: Never  . Sexual activity: Yes    Birth control/protection: I.U.D.    Comment: Paragard inserted July 2018  Lifestyle  .  Physical activity    Days per week: Not on file    Minutes per session: Not on file  . Stress: Not on file  Relationships  . Social Musicianconnections    Talks on phone: Not on file    Gets together: Not on file    Attends religious service: Not on file    Active member of club or organization: Not on file    Attends meetings of clubs or organizations: Not on file    Relationship status: Not on file  . Intimate partner violence    Fear of current or ex partner: Not on file    Emotionally abused: Not on file    Physically abused: Not on file    Forced sexual activity: Not on file  Other Topics Concern  . Not on file  Social History Narrative  . Not on file    Review of Systems  Constitutional: Negative.   HENT: Negative.   Eyes: Negative.   Respiratory: Negative.   Cardiovascular: Negative.   Gastrointestinal: Negative.   Endocrine: Negative.   Genitourinary: Negative.   Musculoskeletal: Negative.   Skin: Negative.   Allergic/Immunologic: Negative.   Neurological: Negative.   Hematological: Negative.   Psychiatric/Behavioral: Negative.     PHYSICAL EXAMINATION:    BP 120/70 (BP Location: Right Arm, Patient Position: Sitting, Cuff Size: Normal)   Pulse 76   Temp (!) 97.5 F (36.4 C) (Temporal)   Resp 12   Ht 5\' 7"  (1.702 m)   Wt 201 lb 9.6 oz (91.4 kg)   LMP 04/24/2019   BMI 31.58 kg/m     General appearance: alert, cooperative and appears stated age  Pelvic US Uterus with no masses.  IUD in endometrial canal.  Ovaries normal.  No free fluid.   ASSESSMENT  Pelvic pain.  Dyspareunia.  ParaGard IUD patient.  EMS 5.41 mm. Chest pain, nonspecific.   PLAN  We reviewed the ultrasound findings, and I gave her reassurance regarding the IUD position and her anatomy.  We discussed endometriosis at length.  I reviewed the hormone free nature of ParaGard and how this does not improve pelvic pain or painful periods the way hormonal contraception can do.  We reviewed  options for hormonal contraception, and she would like to return to a familiar birth control pill.  Tri-Sprintec 3  packs.  Warning signs discussed. I did review risk of stroke, DVT, PE, and MI. See PCP for chest discomfort if persists. She will report back to me in 3 months if she is happy with returning to combined OCPs.  She understands she can have her IUD removed at any time if she wishes.    An After Visit Summary was printed and given to the patient.  _15_____ minutes face to face time of which over 50% was spent in counseling.

## 2019-05-09 ENCOUNTER — Telehealth: Payer: Self-pay | Admitting: Obstetrics and Gynecology

## 2019-05-09 ENCOUNTER — Encounter: Payer: Self-pay | Admitting: Obstetrics and Gynecology

## 2019-05-09 NOTE — Telephone Encounter (Signed)
Hi, I am having nausea everyday and it really is very annoying. I am having a hard time eating because I feel like I am going to throw up at the thought of food going in my mouth, then I get very weak from not eating. This nausea typically last from morning until about 2pm and then I'm ok for the rest of the evening. I am thinking this is coming from my new birth control pill. I'm not sure if I should ride this out and see if it's get better or if you have any recommendations.  Thank you for your time.

## 2019-05-09 NOTE — Telephone Encounter (Signed)
Left message to call Shelbi Vaccaro, CMA. °

## 2019-05-09 NOTE — Telephone Encounter (Signed)
Patient returning call to Amanda. °

## 2019-05-10 NOTE — Telephone Encounter (Signed)
Encounter reviewed and closed.  

## 2019-05-10 NOTE — Telephone Encounter (Signed)
Spoke with patient. She states Tri-sprintec is making her very nauseated. Patient is taking it on an empty stomach first thing in AM. She is not taking for contraceptive reasons--BC Paragard IUD.Advised patient to take with food at night and see if this helps. If doesn't resolve, she will call back.  Routed to provider to sign and close encounter.

## 2019-05-21 ENCOUNTER — Encounter: Payer: Self-pay | Admitting: Obstetrics and Gynecology

## 2019-05-21 ENCOUNTER — Telehealth: Payer: Self-pay | Admitting: Obstetrics and Gynecology

## 2019-05-21 MED ORDER — NORETHINDRONE 0.35 MG PO TABS: 1.0000 | ORAL_TABLET | Freq: Every day | ORAL | 0 refills | Status: DC

## 2019-05-21 MED FILL — NORETHINDRONE 0.35 MG TAB: 0.35 | 84 days supply | Qty: 84 | Fill #0

## 2019-05-21 NOTE — Telephone Encounter (Signed)
I agree with stopping her birth control pills.  I would have her consider a progesterone only birth control pill, Micronor.  She would need to take it on time every day (within a 3 hour window) and know that there are no reminder placebo pills at the end of the pack.   If this is not a good option, she may want to consider Nexplanon, Depo Provera, or another Mirena IUD (placed under ultrasound guidance.).

## 2019-05-21 NOTE — Telephone Encounter (Signed)
Spoke with patient and advised to stop Tri-Sprintec OCPs. Advised her of Dr.Silva's recommendations below. Patient chooses to start Micronor. Advised to take it on time every day (within a 3 hour window)--no placebo pills. Patient voices understanding. Made 3 month follow up on 08-21-19 3:30pm. Rx for Micronor sent to pharmacy.

## 2019-05-21 NOTE — Telephone Encounter (Signed)
Routing to [place] for further recommendations.

## 2019-05-21 NOTE — Telephone Encounter (Signed)
Patient sent the following correspondence through [facility]. Routing to triage to assist patient with request.  Good Morning, I wanted to inform you that I took the 3 weeks of the birth control and I do not feel comfortable continuing. I have had headaches and 2 migraines in the last 3 weeks as well as increased anxiety, and swelling in my ankles. My headaches and migraines had significantly decreased almost to none after I got the paragard IUD in 2018. I have not had the pain that we think could possibly be endometriosis since taking the pills but the side effects of the medicine is worse than that pain. Please let me know what other options we have. Thank you!

## 2019-06-06 ENCOUNTER — Telehealth: Payer: Self-pay | Admitting: Obstetrics and Gynecology

## 2019-06-06 ENCOUNTER — Encounter: Payer: Self-pay | Admitting: Family Medicine

## 2019-06-06 ENCOUNTER — Encounter: Payer: Self-pay | Admitting: Obstetrics and Gynecology

## 2019-06-06 NOTE — Telephone Encounter (Signed)
Call to patient. Advised patient Dr. Quincy Simmonds could remove IUD for her. Patient verbalized understanding. OV offered to patient to discuss SE of birth control, but patient states she is going to make an appointment to follow up with PCP this week. RN advised to return call to office when ready to schedule IUD removal. Patient agreeable.   Routing to provider and will close encounter.

## 2019-06-06 NOTE — Telephone Encounter (Signed)
Hey, I wanted to let you know that I have stopped taking the 2nd birth control. I cannot handle the side effects. I feel like a crazy person, my ankles are still swelling, and my anxiety is so bad that I cried after my [employer] school supply trip yesterday. My husband is going September 10th for a consultation to get a vasectomy. Once he has this done and is tested and no more sperm is present then I will have my IUD removed. Please let me know if I should make this appointment with you or if this is something that Dr. Rogers Blocker can do.  Thank you!

## 2019-06-07 ENCOUNTER — Other Ambulatory Visit: Payer: Self-pay

## 2019-06-07 DIAGNOSIS — Z20822 Contact with and (suspected) exposure to covid-19: Secondary | ICD-10-CM

## 2019-06-08 LAB — NOVEL CORONAVIRUS, NAA: SARS-CoV-2, NAA: NOT DETECTED

## 2019-06-13 ENCOUNTER — Ambulatory Visit (INDEPENDENT_AMBULATORY_CARE_PROVIDER_SITE_OTHER): Payer: No Typology Code available for payment source | Admitting: Family Medicine

## 2019-06-13 ENCOUNTER — Encounter: Payer: Self-pay | Admitting: Family Medicine

## 2019-06-13 VITALS — Ht 67.0 in | Wt 202.0 lb

## 2019-06-13 DIAGNOSIS — F419 Anxiety disorder, unspecified: Secondary | ICD-10-CM

## 2019-06-13 MED ORDER — FLUOXETINE HCL 20 MG PO CAPS: 20.0000 mg | ORAL_CAPSULE | Freq: Every day | ORAL | 0 refills | Status: DC

## 2019-06-13 MED FILL — FLUoxetine HCL 20 MG CAPS: 20 | 90 days supply | Qty: 90 | Fill #0

## 2019-06-13 NOTE — Progress Notes (Signed)
Patient: Michele Houston MRN: [id number] DOB: Mar 02, 1986 PCP: Orma Flaming, MD     Subjective:  I connected with  Michele Houston on 06/13/19 by a video enabled telemedicine application and verified that I am speaking with the correct person using two identifiers.   I discussed the limitations of evaluation and management by telemedicine. The patient expressed understanding and agreed to proceed.   Chief Complaint  Patient presents with  . Anxiety    HPI: The patient is a 33 y.o. female who presents today for anxiety. She has had issues, but it's getting worse. When she wakes up in the morning she feels shaky and nervous. It happens every morning she has to go somewhere. She went to [employer] the other day and busted out crying. She couldn't breath and felt stressed out. Nothing happened that would have caused this. She has random anxiety at times during the day. She also makes things bigger in her mind than they actually are. She will make situation worse in her mind. She has a really hard time going to sleep and staying asleep due to her mind not shutting off. She thinks about scary things. Last night all she could think about was someone breaking into her daughters room and kidnapping her. She feels like this is starting to control her. She has never been on medication in the past for anxiety. She has done counseling in the past. She is thinking about going back to counselor. She has started to walk but doesn't feel like it helps her much. She denies any depression symptoms and doesn't feel sad. No si/hi/ah/vh. Currently on wellbutrin which works great for her depression. Failed celexa/zoloft. No adverse reactions to these.  Also could not tolerate hydroxyzine. Was on buspar long ago, but doesn't think it helped; however, she can not remember. Was on really low dose.    Review of Systems  Constitutional: Negative for fatigue and fever.  Respiratory: Negative for cough, shortness of breath  and wheezing.   Cardiovascular: Negative for chest pain, palpitations and leg swelling.  Gastrointestinal: Negative for abdominal pain, diarrhea, nausea and vomiting.  Psychiatric/Behavioral: Positive for sleep disturbance. Negative for suicidal ideas. The patient is nervous/anxious.     Allergies Patient is allergic to ceclor [cefaclor] and tri-sprintec [norgestimate-eth estradiol].  Past Medical History Patient  has a past medical history of Anemia and GERD (gastroesophageal reflux disease).  Surgical History Patient  has a past surgical history that includes tubes in ears (baby); Knee surgery (Left, 2011); and Cholecystectomy (N/A, 06/03/2015).  Family History Pateint's family history includes Cervical cancer in her sister; Depression in her father and mother; Hypertension in her brother; Hyperthyroidism in her mother; Lung cancer in her father, paternal grandmother, and paternal uncle.  Social History Patient  reports that she quit smoking about 8 years ago. Her smoking use included cigarettes. She has a 4.00 pack-year smoking history. She has never used smokeless tobacco. She reports previous alcohol use. She reports that she does not use drugs.    Objective: Vitals:   06/13/19 1037  Weight: 202 lb (91.6 kg)  Height: 5\' 7"  (1.702 m)    Body mass index is 31.64 kg/m.  Physical Exam Vitals signs reviewed.  Constitutional:      Appearance: Normal appearance.  Eyes:     Extraocular Movements: Extraocular movements intact.  Pulmonary:     Effort: Pulmonary effort is normal.  Neurological:     General: No focal deficit present.     Mental Status:  She is alert and oriented to person, place, and time.  Psychiatric:        Mood and Affect: Mood normal.     Comments: No si/hi/ah/vh         GAD 7 : Generalized Anxiety Score 06/13/2019 03/29/2019 12/06/2018  Nervous, Anxious, on Edge 3 1 3   Control/stop worrying 3 1 1   Worry too much - different things 3 1 2   Trouble relaxing  3 1 3   Restless 0 1 3  Easily annoyed or irritable 3 2 3   Afraid - awful might happen 3 0 1  Total GAD 7 Score 18 7 16   Anxiety Difficulty Somewhat difficult Somewhat difficult Somewhat difficult     Assessment/plan: 1. Anxiety GAD7 score significant for severe anxiety. Discussed wellbutrin can increase anxiety, but she is really happy with this for her depression. She is going to start counseling, which I highly recommend. Also want her to start a more rigorous exercise program. We are going to do trial of prozac. No adverse reactions to previous SSRIs. Discussed increased risk of interaction with wellbutrin +prozac. Will do low dose and have close f/u in one month. Can not tolerate hydroxyzine for panic attacks/increased anxiety. If not doing better, would consider adding on klonopin.    Return in about 1 month (around 07/14/2019) for anxiety medication f/u .   Orland MustardAllison Abhijot Straughter, MD [facility]

## 2019-06-15 ENCOUNTER — Encounter: Payer: Self-pay | Admitting: Family Medicine

## 2019-07-04 ENCOUNTER — Other Ambulatory Visit: Payer: Self-pay | Admitting: Family Medicine

## 2019-07-04 MED FILL — OMEPRAZOLE 20 MG CAP: 20 | 90 days supply | Qty: 90 | Fill #0

## 2019-07-04 MED FILL — BUPROPION HCL SR 150 MG TAB: 150 | 90 days supply | Qty: 90 | Fill #2

## 2019-07-04 MED FILL — LEVOCETIRIZINE 5 MG TABLET: 5 | 90 days supply | Qty: 90 | Fill #2

## 2019-07-17 ENCOUNTER — Ambulatory Visit (INDEPENDENT_AMBULATORY_CARE_PROVIDER_SITE_OTHER): Payer: No Typology Code available for payment source | Admitting: Family Medicine

## 2019-07-17 ENCOUNTER — Other Ambulatory Visit: Payer: Self-pay

## 2019-07-17 DIAGNOSIS — R0982 Postnasal drip: Secondary | ICD-10-CM | POA: Diagnosis not present

## 2019-07-17 DIAGNOSIS — J029 Acute pharyngitis, unspecified: Secondary | ICD-10-CM

## 2019-07-17 NOTE — Progress Notes (Signed)
Virtual Visit via Video Note  I connected with Michele Houston  on 07/17/19 at  4:00 PM EDT by a video enabled telemedicine application and verified that I am speaking with the correct person using two identifiers.  Location patient: home Location provider:work or home office Persons participating in the virtual visit: patient, provider  I discussed the limitations of evaluation and management by telemedicine and the availability of in person appointments. The patient expressed understanding and agreed to proceed.   HPI:  Acute visit for Sore throat: -started 3 days ago -she went white water rafting a over the weekend -symptoms include sore throat, nausea, a little ear pain, feels like drainage in throat, mild cough -thought it might be allergies since has been outside a lot -denies fever, SOB, diarrhea, vomiting, abd pain, dysphagia, rashes, loss of taste or smell, body aches, much of a cough - but does cough occassionally -works at a pharmacy -no known sick contacts, denies potential exposures to COVID - reporrts uses masks at all times at work  -allergy to Standard Pacificceclor as a newborn -fdlmp 06/24/19   ROS: See pertinent positives and negatives per HPI.  Past Medical History:  Diagnosis Date  . Anemia    when pregnant only  . GERD (gastroesophageal reflux disease)     Past Surgical History:  Procedure Laterality Date  . CHOLECYSTECTOMY N/A 06/03/2015   Procedure: LAPAROSCOPIC CHOLECYSTECTOMY WITH INTRAOPERATIVE CHOLANGIOGRAM;  Surgeon: Earline MayotteJeffrey W Byrnett, MD;  Location: [facility] ORS;  Service: General;  Laterality: N/A;  . KNEE SURGERY Left 2011   arthroscopy  . tubes in ears  baby    Family History  Problem Relation Age of Onset  . Hyperthyroidism Mother   . Depression Mother   . Lung cancer Father   . Depression Father   . Lung cancer Paternal Uncle   . Lung cancer Paternal Grandmother   . Cervical cancer Sister   . Hypertension Brother     SOCIAL HX: see hpi   Current Outpatient  Medications:  .  baclofen (LIORESAL) 10 MG tablet, Take 1 tablet (10 mg total) by mouth 3 (three) times daily., Disp: 30 each, Rfl: 1 .  buPROPion (WELLBUTRIN SR) 150 MG 12 hr tablet, Take 1 tablet (150 mg total) by mouth daily., Disp: 90 tablet, Rfl: 3 .  fexofenadine (ALLEGRA) 180 MG tablet, Take 180 mg by mouth daily., Disp: , Rfl:  .  FLUoxetine (PROZAC) 20 MG capsule, Take 1 capsule (20 mg total) by mouth daily., Disp: 90 capsule, Rfl: 0 .  fluticasone (FLONASE) 50 MCG/ACT nasal spray, Place 2 sprays into both nostrils daily., Disp: 16 g, Rfl: 3 .  omeprazole (PRILOSEC) 20 MG capsule, TAKE 1 CAPSULE BY MOUTH ONCE A DAY, Disp: 90 capsule, Rfl: 1 .  sodium chloride (OCEAN) 0.65 % SOLN nasal spray, Place 1 spray into both nostrils as needed for congestion., Disp: , Rfl:   EXAM:  VITALS per patient if applicable:denies fever, just checked  GENERAL: alert, oriented, appears well and in no acute distress  HEENT: atraumatic, conjunttiva clear, no obvious abnormalities on inspection of external nose and ears, no drainage from the nose or ears, no pain when she tugs on her ear, was able to see her throat pretty well via video as had a good picture and good light - tonsils small, mild post oropharyngeal erythema with PND, no tonsillar hypertrophy or exudate  NECK: normal movements of the head and neck, reports some TTP ant cervical lymph chains  LUNGS: on inspection no signs  of respiratory distress, breathing rate appears normal, no obvious gross SOB, gasping or wheezing  CV: no obvious cyanosis  MS: moves all visible extremities without noticeable abnormality  PSYCH/NEURO: pleasant and cooperative, no obvious depression or anxiety, speech and thought processing grossly intact  ASSESSMENT AND PLAN:  Discussed the following assessment and plan:  Sore throat  PND (post-nasal drip)  -we discussed possible serious and likely etiologies, options for evaluation and workup, limitations of  telemedicine visit vs in person visit, treatment, treatment risks and precautions. Pt prefers to treat via telemedicine empirically rather then risking or undertaking an in person visit at this moment. Discussed VURI vs allergies vs other. Advised she contact health at work immediately after this visit and notify them of her symptoms for recs regarding testing for COVID, isolation, etc. Advised typical would isolate with any symptoms that overlap with COVID. Trial antihistamine and salt water gargles. Does not meet criteria for strep treatment currently- advised to follow up/seek care if any worsening, purulent discharge or if not improving over the next few days. Patient agrees to seek prompt in person care if worsening, new symptoms arise, or if is not improving with treatment over the next 2  days.   I discussed the assessment and treatment plan with the patient. The patient was provided an opportunity to ask questions and all were answered. The patient agreed with the plan and demonstrated an understanding of the instructions.   The patient was advised to call back or seek an in-person evaluation if the symptoms worsen or if the condition fails to improve as anticipated.   [name], DO   Patient Instructions  -warm salt water gargles several times per day  - antihistamine such as zyrtec once daily for 2 weeks  -Aleve or tylenol per instructions as needed  -because your symptoms overlap with some COVID19 symptoms please call [facility] immediately following this call for recommendations regarding isolation and testing.  -Typically we advise isolation per the CDC if any symptoms  for any illness with symptom overlap with COVID19.  I hope you are feeling better soon! Seek care promptly if your symptoms worsen, new concerns arise or you are not improving with treatment over the next 2 days

## 2019-07-17 NOTE — Patient Instructions (Signed)
-  warm salt water gargles several times per day  - antihistamine such as zyrtec once daily for 2 weeks  -Aleve or tylenol per instructions as needed  -because your symptoms overlap with some COVID19 symptoms please call [facility] immediately following this call for recommendations regarding isolation and testing.  -Typically we advise isolation per the CDC if any symptoms  for any illness with symptom overlap with COVID19.  I hope you are feeling better soon! Seek care promptly if your symptoms worsen, new concerns arise or you are not improving with treatment over the next 2 days

## 2019-07-19 ENCOUNTER — Encounter: Payer: Self-pay | Admitting: Family Medicine

## 2019-07-19 ENCOUNTER — Other Ambulatory Visit: Payer: Self-pay

## 2019-07-19 ENCOUNTER — Ambulatory Visit (INDEPENDENT_AMBULATORY_CARE_PROVIDER_SITE_OTHER): Payer: No Typology Code available for payment source | Admitting: Family Medicine

## 2019-07-19 VITALS — Temp 97.3°F

## 2019-07-19 DIAGNOSIS — R0981 Nasal congestion: Secondary | ICD-10-CM | POA: Diagnosis not present

## 2019-07-19 NOTE — Progress Notes (Signed)
Virtual Visit via Video Note  I connected with Michele Houston  on 07/19/19 at 10:40 AM EDT by a video enabled telemedicine application and verified that I am speaking with the correct person using two identifiers.  Location patient: home Location provider:work or home office Persons participating in the virtual visit: patient, provider  I discussed the limitations of evaluation and management by telemedicine and the availability of in person appointments. The patient expressed understanding and agreed to proceed.   HPI:  Follow up resp illness: -started 07/13/18 -symptoms include nasal congestion, cough, sinus pressure, sinus drainage is clear -denies fevers, SOB, vomiting, diarrhea (resolved), bodyaches (resolved) -her COVID test results are pending -denies history of sinus surgery -reports has taken antibiotic for this in the past, so was wondering if that would help - daughter's birthday is today and she was hoping to feel better fast  ROS: See pertinent positives and negatives per HPI.  Past Medical History:  Diagnosis Date  . Anemia    when pregnant only  . GERD (gastroesophageal reflux disease)     Past Surgical History:  Procedure Laterality Date  . CHOLECYSTECTOMY N/A 06/03/2015   Procedure: LAPAROSCOPIC CHOLECYSTECTOMY WITH INTRAOPERATIVE CHOLANGIOGRAM;  Surgeon: Earline MayotteJeffrey W Byrnett, MD;  Location: [facility] ORS;  Service: General;  Laterality: N/A;  . KNEE SURGERY Left 2011   arthroscopy  . tubes in ears  baby    Family History  Problem Relation Age of Onset  . Hyperthyroidism Mother   . Depression Mother   . Lung cancer Father   . Depression Father   . Lung cancer Paternal Uncle   . Lung cancer Paternal Grandmother   . Cervical cancer Sister   . Hypertension Brother     SOCIAL HX:see hpi   Current Outpatient Medications:  .  baclofen (LIORESAL) 10 MG tablet, Take 1 tablet (10 mg total) by mouth 3 (three) times daily., Disp: 30 each, Rfl: 1 .  buPROPion (WELLBUTRIN SR)  150 MG 12 hr tablet, Take 1 tablet (150 mg total) by mouth daily., Disp: 90 tablet, Rfl: 3 .  fexofenadine (ALLEGRA) 180 MG tablet, Take 180 mg by mouth daily., Disp: , Rfl:  .  FLUoxetine (PROZAC) 20 MG capsule, Take 1 capsule (20 mg total) by mouth daily., Disp: 90 capsule, Rfl: 0 .  fluticasone (FLONASE) 50 MCG/ACT nasal spray, Place 2 sprays into both nostrils daily., Disp: 16 g, Rfl: 3 .  omeprazole (PRILOSEC) 20 MG capsule, TAKE 1 CAPSULE BY MOUTH ONCE A DAY, Disp: 90 capsule, Rfl: 1 .  sodium chloride (OCEAN) 0.65 % SOLN nasal spray, Place 1 spray into both nostrils as needed for congestion., Disp: , Rfl:   EXAM:  VITALS per patient if applicable:denies fever  GENERAL: alert, oriented, appears well and in no acute distress  HEENT: atraumatic, conjunttiva clear, no obvious abnormalities on inspection of external nose and ears, no gross rhinorrhea, moist mucus membranes  NECK: normal movements of the head and neck  LUNGS: on inspection no signs of respiratory distress, breathing rate appears normal, no obvious gross SOB, gasping or wheezing  CV: no obvious cyanosis  MS: moves all visible extremities without noticeable abnormality  PSYCH/NEURO: pleasant and cooperative, no obvious depression or anxiety, speech and thought processing grossly intact  ASSESSMENT AND PLAN:  Discussed the following assessment and plan:  Sinus congestion  -we discussed possible serious and likely etiologies, options for evaluation and workup and precautions. VURI suspected. COVID19 testing pending. Overall it sounds like except for the sinus congestion she is  doing better. Has clear nasal drainage, no fever or thick congestion. Discussed standard of care for abx for URI. Discussed risks/benefits of abx. Opted to continue with nasal saline, antihistamine, short course afrin if needed, otc analgesic if needed per instructions for now, along with isolation. Patient agrees to seek prompt in person care if  worsening, new symptoms arise, or if is not improving with treatment over the next several days.   I discussed the assessment and treatment plan with the patient. The patient was provided an opportunity to ask questions and all were answered. The patient agreed with the plan and demonstrated an understanding of the instructions.   The patient was advised to call back or seek an in-person evaluation if the symptoms worsen or if the condition fails to improve as anticipated.   Michele Kern, DO   Patient Instructions  -nasal saline twice daily  -can use antihistamine per instructions such as zyrtec  -Can use short course of Afrin nasal spray if you need to, do not use for more then 3 days in a row  -can use tylenol or aleve per instructions if needed  -complete isolation regardless of test results and let us know if you have a positive COVID19 test.  I hope you are feeling better soon! Seek care promptly if your symptoms worsen, new concerns arise or you are not improving with treatment over the next several days.

## 2019-07-19 NOTE — Patient Instructions (Signed)
-  nasal saline twice daily  -can use antihistamine per instructions such as zyrtec  -Can use short course of Afrin nasal spray if you need to, do not use for more then 3 days in a row  -can use tylenol or aleve per instructions if needed  -complete isolation regardless of test results and let us know if you have a positive COVID19 test.  I hope you are feeling better soon! Seek care promptly if your symptoms worsen, new concerns arise or you are not improving with treatment over the next several days.

## 2019-07-20 MED FILL — AMOX-CLAV 875-125 MG TABLET: 875-125 | 10 days supply | Qty: 20 | Fill #0

## 2019-07-20 MED FILL — predniSONE 10 MG TABS: 10 | 5 days supply | Qty: 10 | Fill #0

## 2019-07-24 ENCOUNTER — Encounter: Payer: Self-pay | Admitting: Family Medicine

## 2019-08-21 ENCOUNTER — Ambulatory Visit: Payer: No Typology Code available for payment source | Admitting: Obstetrics and Gynecology

## 2019-08-24 ENCOUNTER — Ambulatory Visit: Payer: No Typology Code available for payment source | Admitting: Family Medicine

## 2019-09-07 ENCOUNTER — Ambulatory Visit: Payer: No Typology Code available for payment source | Admitting: Family Medicine

## 2019-10-18 ENCOUNTER — Telehealth: Payer: Self-pay | Admitting: Obstetrics and Gynecology

## 2019-10-18 DIAGNOSIS — Z30432 Encounter for removal of intrauterine contraceptive device: Secondary | ICD-10-CM

## 2019-10-18 NOTE — Telephone Encounter (Signed)
Left message to call Anntionette Madkins, RN at [facility] [phone].   

## 2019-10-18 NOTE — Telephone Encounter (Signed)
Spoke with patient. Patient is requesting IUD removal, has discussed with Dr. Quincy Simmonds previously. Last AEX 04/26/19. Reports pelvic pain and spotting every time she has intercourse. Spouse is s/p vasectomy on 08/16/19, plans to use condoms after IUD is removed. Patient is requesting to schedule last week of December.   OV scheduled for IUD removal on 10/31/19 at 10am with Dr. Quincy Simmonds. Order placed for precert. Patient verbalizes understanding and is agreeable.   Routing to provider for final review. Patient is agreeable to disposition. Will close encounter.  Cc: Lerry Liner, Magdalene Patricia

## 2019-10-18 NOTE — Telephone Encounter (Signed)
Patient want to discuss iud removal. °

## 2019-10-23 ENCOUNTER — Telehealth: Payer: Self-pay | Admitting: Obstetrics and Gynecology

## 2019-10-23 NOTE — Progress Notes (Signed)
GYNECOLOGY  VISIT   HPI: 33 y.o.   Married  Caucasian  female   [id number] with Patient's last menstrual period was 10/22/2019 (exact date).   here for Paragard IUD removal.   Her husband had a vasectomy on 08/16/19 and he will have his semen analysis done. He will use condoms until the SA is done.   GYNECOLOGIC HISTORY: Patient's last menstrual period was 10/22/2019 (exact date). Contraception:  Paragard IUD/Vasectomy Menopausal hormone therapy:  n/a Last mammogram:  n/a Last pap smear: 03/29/19 Neg:Neg HR HPV        OB History    Gravida  3   Para  3   Term  0   Preterm  0   AB  0   Living  3     SAB  0   TAB  0   Ectopic  0   Multiple  0   Live Births  0        Obstetric Comments  1st Menstrual Cycle: 13 1st Pregnancy:  20            Patient Active Problem List   Diagnosis Date Noted  . Migraine 12/29/2018  . IUD (intrauterine device) in place 12/06/2018  . Depression 12/06/2018  . Anxiety 12/06/2018    Past Medical History:  Diagnosis Date  . Anemia    when pregnant only  . GERD (gastroesophageal reflux disease)     Past Surgical History:  Procedure Laterality Date  . CHOLECYSTECTOMY N/A 06/03/2015   Procedure: LAPAROSCOPIC CHOLECYSTECTOMY WITH INTRAOPERATIVE CHOLANGIOGRAM;  Surgeon: Earline Mayotte, MD;  Location: [facility] ORS;  Service: General;  Laterality: N/A;  . KNEE SURGERY Left 2011   arthroscopy  . tubes in ears  baby    Current Outpatient Medications  Medication Sig Dispense Refill  . buPROPion (WELLBUTRIN SR) 150 MG 12 hr tablet Take 1 tablet (150 mg total) by mouth daily. 90 tablet 3  . fluticasone (FLONASE) 50 MCG/ACT nasal spray Place 2 sprays into both nostrils daily. 16 g 3  . levocetirizine (XYZAL) 5 MG tablet Take 1 tablet by mouth at bedtime.    Marland Kitchen omeprazole (PRILOSEC) 20 MG capsule TAKE 1 CAPSULE BY MOUTH ONCE A DAY 90 capsule 1  . sodium chloride (OCEAN) 0.65 % SOLN nasal spray Place 1 spray into both nostrils as needed  for congestion.     No current facility-administered medications for this visit.     ALLERGIES: Ceclor [cefaclor] and Tri-sprintec [norgestimate-eth estradiol]  Family History  Problem Relation Age of Onset  . Hyperthyroidism Mother   . Depression Mother   . Lung cancer Father   . Depression Father   . Lung cancer Paternal Uncle   . Lung cancer Paternal Grandmother   . Cervical cancer Sister   . Hypertension Brother     Social History   Socioeconomic History  . Marital status: Married    Spouse name: Not on file  . Number of children: Not on file  . Years of education: Not on file  . Highest education level: Not on file  Occupational History  . Not on file  Tobacco Use  . Smoking status: Former Smoker    Packs/day: 1.00    Years: 4.00    Pack years: 4.00    Types: Cigarettes    Quit date: 05/30/2011    Years since quitting: 8.4  . Smokeless tobacco: Never Used  Substance and Sexual Activity  . Alcohol use: Not Currently    Alcohol/week: 0.0  standard drinks  . Drug use: Never  . Sexual activity: Yes    Birth control/protection: I.U.D.    Comment: Paragard inserted July 2018  Other Topics Concern  . Not on file  Social History Narrative  . Not on file   Social Determinants of Health   Financial Resource Strain:   . Difficulty of Paying Living Expenses: Not on file  Food Insecurity:   . Worried About Charity fundraiser in the Last Year: Not on file  . Ran Out of Food in the Last Year: Not on file  Transportation Needs:   . Lack of Transportation (Medical): Not on file  . Lack of Transportation (Non-Medical): Not on file  Physical Activity:   . Days of Exercise per Week: Not on file  . Minutes of Exercise per Session: Not on file  Stress:   . Feeling of Stress : Not on file  Social Connections:   . Frequency of Communication with Friends and Family: Not on file  . Frequency of Social Gatherings with Friends and Family: Not on file  . Attends Religious  Services: Not on file  . Active Member of Clubs or Organizations: Not on file  . Attends Archivist Meetings: Not on file  . Marital Status: Not on file  Intimate Partner Violence:   . Fear of Current or Ex-Partner: Not on file  . Emotionally Abused: Not on file  . Physically Abused: Not on file  . Sexually Abused: Not on file    Review of Systems  All other systems reviewed and are negative.   PHYSICAL EXAMINATION:    BP 118/74 (Cuff Size: Large)   Pulse 66   Temp (!) 97.1 F (36.2 C) (Temporal)   Ht 5\' 7"  (1.702 m)   Wt 201 lb (91.2 kg)   LMP 10/22/2019 (Exact Date)   BMI 31.48 kg/m     General appearance: alert, cooperative and appears stated age  Pelvic: External genitalia:  no lesions              Urethra:  normal appearing urethra with no masses, tenderness or lesions              Bartholins and Skenes: normal                 Vagina: normal appearing vagina with normal color and discharge, no lesions              Cervix: no lesions.  IUD strings noted.                Bimanual Exam:  Uterus:  normal size, contour, position, consistency, mobility, non-tender              Adnexa: no mass, fullness, tenderness      IUD removal.  Consent for procedure.  Ring forceps used to remove IUD intact and discarded.  Chaperone was present for exam.  ASSESSMENT  Encounter for IUD removal.   PLAN  Ibuprofen prn.  She will use condoms for back up pregnancy prevention until her husband does his post vasectomy SA. Fu prn.    An After Visit Summary was printed and given to the patient.

## 2019-10-23 NOTE — Telephone Encounter (Signed)
Call placed to convey benefits. Spoke with the patient and conveyed the benefits. She understands/ahgreeable with the benefits. Patient is aware of the cancellation policy. Appointment scheduled 10/31/19.

## 2019-10-31 ENCOUNTER — Ambulatory Visit: Payer: Self-pay

## 2019-10-31 ENCOUNTER — Encounter: Payer: Self-pay | Admitting: Obstetrics and Gynecology

## 2019-10-31 ENCOUNTER — Ambulatory Visit (INDEPENDENT_AMBULATORY_CARE_PROVIDER_SITE_OTHER): Payer: No Typology Code available for payment source | Admitting: Obstetrics and Gynecology

## 2019-10-31 ENCOUNTER — Other Ambulatory Visit: Payer: Self-pay

## 2019-10-31 DIAGNOSIS — Z30432 Encounter for removal of intrauterine contraceptive device: Secondary | ICD-10-CM | POA: Diagnosis not present

## 2020-01-03 ENCOUNTER — Encounter: Payer: Self-pay | Admitting: Family Medicine

## 2020-01-03 ENCOUNTER — Other Ambulatory Visit: Payer: Self-pay

## 2020-01-16 ENCOUNTER — Other Ambulatory Visit: Payer: Self-pay | Admitting: Family Medicine

## 2020-02-07 ENCOUNTER — Ambulatory Visit: Payer: No Typology Code available for payment source | Admitting: Nurse Practitioner

## 2020-02-07 ENCOUNTER — Encounter: Payer: Self-pay | Admitting: Nurse Practitioner

## 2020-02-07 ENCOUNTER — Other Ambulatory Visit: Payer: Self-pay

## 2020-02-07 VITALS — BP 102/68 | HR 74 | Temp 97.3°F | Ht 67.0 in | Wt 191.2 lb

## 2020-02-07 DIAGNOSIS — M7711 Lateral epicondylitis, right elbow: Secondary | ICD-10-CM

## 2020-02-07 DIAGNOSIS — G8929 Other chronic pain: Secondary | ICD-10-CM

## 2020-02-07 DIAGNOSIS — F419 Anxiety disorder, unspecified: Secondary | ICD-10-CM

## 2020-02-07 DIAGNOSIS — M25521 Pain in right elbow: Secondary | ICD-10-CM

## 2020-02-07 MED ORDER — MELOXICAM 7.5 MG PO TABS: 7.5000 mg | ORAL_TABLET | Freq: Every day | ORAL | 0 refills | Status: DC

## 2020-02-07 MED ORDER — ALPRAZOLAM 0.25 MG PO TABS: ORAL_TABLET | ORAL | 0 refills | Status: DC

## 2020-02-07 NOTE — Patient Instructions (Addendum)
It was nice to meet you today.   For your request for ADHD testing- I sent in a referral to [facility] in [place].  We know they should accept your insurance into optimal screening for ADHD.  I sent in a referral to Ortho for your suspected right tennis elbow.  Mobic as needed 1 per day 5-7 days.  Monitor for stomach upset and stop if it bothers you.  Wear the elbow strap at work and monitor if it helps. Take off at home and at night.  for your persistent  elbow pain- likely tennis elbow.   For your fear of flying, I am not comfortable with your valium 5mg  -10 mg  request, but I will discuss with my attending physician other options including Xanax.   Tennis Elbow  Tennis elbow (lateral epicondylitis) is inflammation of tendons in your outer forearm, near your elbow. Tendons are tissues that connect muscle to bone. When you have tennis elbow, inflammation affects the tendons that you use to bend your wrist and move your hand up. Inflammation occurs in the lower part of the upper arm bone (humerus), where the tendons connect to the bone (lateral epicondyle). Tennis elbow often affects people who play tennis, but anyone may get the condition from repeatedly extending the wrist or turning the forearm. What are the causes? This condition is usually caused by repeatedly extending the wrist, turning the forearm, and using the hands. It can result from sports or work that requires repetitive forearm movements. In some cases, it may be caused by a sudden injury. What increases the risk? You are more likely to develop tennis elbow if you play tennis or another racket sport. You also have a higher risk if you frequently use your hands for work. Besides people who play tennis, others at greater risk include:  Musicians.  Carpenters, painters, and plumbers.  Cooks.  Cashiers.  People who work in [employer].  Architect workers.  Butchers.  People who use computers. What  are the signs or symptoms? Symptoms of this condition include:  Pain and tenderness in the forearm and the outer part of the elbow. Pain may be felt only when using the arm, or it may be there all the time.  A burning feeling that starts in the elbow and spreads down the forearm.  A weak grip in the hand. How is this diagnosed? This condition may be diagnosed based on:  Your symptoms and medical history.  A physical exam.  X-rays.  MRI. How is this treated? Resting and icing your arm is often the first treatment. Your health care provider may also recommend:  Medicines to reduce pain and inflammation. These may be in the form of a pill, topical gels, or shots of a steroid medicine (cortisone).  An elbow strap to reduce stress on the area.  Physical therapy. This may include massage or exercises.  An elbow brace to restrict the movements that cause symptoms. If these treatments do not help relieve your symptoms, your health care provider may recommend surgery to remove damaged muscle and reattach healthy muscle to bone. Follow these instructions at home: Activity  Rest your elbow and wrist and avoid activities that cause symptoms, as told by your health care provider.  Do physical therapy exercises as instructed.  If you lift an object, lift it with your palm facing up. This reduces stress on your elbow. Lifestyle  If your tennis elbow is caused by sports, check your equipment and make sure that: ?  You are using it correctly. ? It is the best fit for you.  If your tennis elbow is caused by work or computer use, take frequent breaks to stretch your arm. Talk with your manager about ways to manage your condition at work. If you have a brace:  Wear the brace or strap as told by your health care provider. Remove it only as told by your health care provider.  Loosen the brace if your fingers tingle, become numb, or turn cold and blue.  Keep the brace clean.  If the brace  is not waterproof, ask if you may remove it for bathing. If you must keep the brace on while bathing: ? Do not let it get wet. ? Cover it with a watertight covering when you take a bath or a shower. General instructions   If directed, put ice on the painful area: ? Put ice in a plastic bag. ? Place a towel between your skin and the bag. ? Leave the ice on for 20 minutes, 2-3 times a day.  Take over-the-counter and prescription medicines only as told by your health care provider.  Keep all follow-up visits as told by your health care provider. This is important. Contact a health care provider if:  You have pain that gets worse or does not get better with treatment.  You have numbness or weakness in your forearm, hand, or fingers. Summary  Tennis elbow (lateral epicondylitis) is inflammation of tendons in your outer forearm, near your elbow.  Common symptoms include pain and tenderness in your forearm and the outer part of your elbow.  This condition is usually caused by repeatedly extending your wrist, turning your forearm, and using your hands.  The first treatment is often resting and icing your arm to relieve symptoms. Further treatment may include taking medicine, getting physical therapy, wearing a brace or strap, or having surgery. This information is not intended to replace advice given to you by your health care provider. Make sure you discuss any questions you have with your health care provider. Document Revised: 07/14/2018 Document Reviewed: 08/02/2017 Elsevier Patient Education  2020 [employer].

## 2020-02-07 NOTE — Progress Notes (Signed)
New Patient Office Visit  Subjective:  Patient ID: Michele Houston, female    DOB: Jan 16, 1986  Age: 34 y.o. MRN: [id number]  CC:  Chief Complaint  Patient presents with  . Establish Care    HPI Michele Houston presents to establish care. She has 3 specific concerns today:  1. Left lateal elbow tenderness with palpation and movement:  She thinks she has tennis elbow that may have started after she painted a room the end of Jan because the next day her lateral elbow hurt real bad. Her mom thought it was tennis elbow. She took her son's meloxicam and it helped for a few days. She also has been working out at [employer] over the last 1.5 months. She has been weight lifting and it made her elbow worse. She had to stop weight lifting. She tried a tennis elbow band and has worn that some and it helped.   Since then, she continues to have daily pain when she presses on the lateral elbow of performs any flexion and extension of her right wrist. No swelling in the elbow and she has full normal ROM. "It hurts so bad when I am pulling syringes at work. 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 No prior hx of elbow pain.   2. Depression/Anxiety: Presents on Wellbutrin 150 mg once daily. Reports it helps a lot. She talks to a therapist at [facility] and reports the  therapist is wondering about ADD. The patient always thought she just had anxiety.  She feels like her emotions are all over the place and she has had a few panic attacks. She is irritable all of the time. Shopping makes her feel overwhelmed and she can't sort it out. She is married with 3 children ages 85-10 and 37 and is very busy all of the time. She feels like she cannot stay focused on anything and can't sit still. She always has to do things super fast. She has a safe and supportive husband. He has told her she is irritable. She denies any suicidal thoughts or plans and reports  that she has not had those thoughts in a long time.   3. Phobia with flying:  To go to [facility] in May and gets on 2 planes. Hydroxyzine did not work. She reports she took Valium 10 mg on the last trip and it did not make her sleepy. She is requesting Valium to take before she gets on the plane.   IUD placed in 2018 and removed 10/2019. Her  husband had vasectomy. LMP- 01/23/20- normal x 4 days with 1 heavy day.   She is not planning to get the Covid vaccine.   Past Medical History:  Diagnosis Date  . Anemia    when pregnant only  . GERD (gastroesophageal reflux disease)     Past Surgical History:  Procedure Laterality Date  . CHOLECYSTECTOMY N/A 06/03/2015   Procedure: LAPAROSCOPIC CHOLECYSTECTOMY WITH INTRAOPERATIVE CHOLANGIOGRAM;  Surgeon: Robert Bellow, MD;  Location: [facility] ORS;  Service: General;  Laterality: N/A;  . KNEE SURGERY Left 2011   arthroscopy  . tubes in ears  baby    Family History  Problem Relation Age of Onset  . Hyperthyroidism Mother   . Depression Mother   . Lung cancer Father   . Depression Father   . Drug abuse Father   . Lung cancer Paternal Uncle   . Lung cancer  Paternal Grandmother   . Cervical cancer Sister   . Hypertension Brother     Social History   Socioeconomic History  . Marital status: Married    Spouse name: Not on file  . Number of children: Not on file  . Years of education: Not on file  . Highest education level: Not on file  Occupational History  . Occupation: Chief Executive Officer:   Tobacco Use  . Smoking status: Former Smoker    Packs/day: 1.00    Years: 4.00    Pack years: 4.00    Types: Cigarettes    Quit date: 05/30/2011    Years since quitting: 8.6  . Smokeless tobacco: Never Used  Substance and Sexual Activity  . Alcohol use: Not Currently    Alcohol/week: 0.0 standard drinks  . Drug use: Never  . Sexual activity: Yes    Birth control/protection: None    Comment: Vasectomy in husband  Other  Topics Concern  . Not on file  Social History Narrative  . Not on file   Social Determinants of Health   Financial Resource Strain:   . Difficulty of Paying Living Expenses:   Food Insecurity:   . Worried About Programme researcher, broadcasting/film/video in the Last Year:   . Barista in the Last Year:   Transportation Needs:   . Freight forwarder (Medical):   Marland Kitchen Lack of Transportation (Non-Medical):   Physical Activity:   . Days of Exercise per Week:   . Minutes of Exercise per Session:   Stress:   . Feeling of Stress :   Social Connections:   . Frequency of Communication with Friends and Family:   . Frequency of Social Gatherings with Friends and Family:   . Attends Religious Services:   . Active Member of Clubs or Organizations:   . Attends Banker Meetings:   Marland Kitchen Marital Status:   Intimate Partner Violence:   . Fear of Current or Ex-Partner:   . Emotionally Abused:   Marland Kitchen Physically Abused:   . Sexually Abused:     Review of Systems  Constitutional: Negative.   Eyes: Negative.   Respiratory: Negative.   Cardiovascular: Negative.   Gastrointestinal: Negative.   Endocrine: Negative.   Genitourinary: Negative.   Musculoskeletal:       See HPI  Skin: Negative.   Neurological: Negative.   Psychiatric/Behavioral:       See HPI.     Objective:   Today's Vitals: BP 102/68   Pulse 74   Temp (!) 97.3 F (36.3 C) (Temporal)   Ht 5\' 7"  (1.702 m)   Wt 191 lb 3.2 oz (86.7 kg)   SpO2 98%   BMI 29.95 kg/m   Physical Exam Vitals reviewed.  Constitutional:      Appearance: Normal appearance.  HENT:     Head: Normocephalic and atraumatic.  Cardiovascular:     Rate and Rhythm: Normal rate and regular rhythm.     Pulses: Normal pulses.     Heart sounds: Normal heart sounds.  Pulmonary:     Effort: Pulmonary effort is normal.     Breath sounds: Normal breath sounds.  Abdominal:     Palpations: Abdomen is soft.     Tenderness: There is no abdominal tenderness.   Musculoskeletal:        General: Tenderness present.     Comments: Right elbow with trigger point tenderness at lateral epicondyl area. Some pull and mild discomfort  with wrist flexion and hyperextension. No swelling, erythema, bruising  or any bony deformity. Intact pulses.   Skin:    General: Skin is warm and dry.  Neurological:     General: No focal deficit present.     Mental Status: She is alert and oriented to person, place, and time.  Psychiatric:        Mood and Affect: Mood normal.        Behavior: Behavior normal.        Thought Content: Thought content normal.     Assessment & Plan:   Problem List Items Addressed This Visit      Other   Anxiety - Primary   Relevant Orders   [facility]    Other Visit Diagnoses    Chronic elbow pain, right       Relevant Medications   meloxicam (MOBIC) 7.5 MG tablet   Other Relevant Orders   AMB referral to orthopedics   Lateral epicondylitis of right elbow       Relevant Medications   meloxicam (MOBIC) 7.5 MG tablet      Outpatient Encounter Medications as of 02/07/2020  Medication Sig  . buPROPion (WELLBUTRIN SR) 150 MG 12 hr tablet TAKE 1 TABLET BY MOUTH ONCE A DAY  . cetirizine (ZYRTEC) 10 MG tablet Take 10 mg by mouth daily.  Marland Kitchen omeprazole (PRILOSEC) 20 MG capsule TAKE 1 CAPSULE BY MOUTH ONCE A DAY  . fluticasone (FLONASE) 50 MCG/ACT nasal spray Place 2 sprays into both nostrils daily. (Patient not taking: Reported on 02/07/2020)  . meloxicam (MOBIC) 7.5 MG tablet Take 1 tablet (7.5 mg total) by mouth daily.  . sodium chloride (OCEAN) 0.65 % SOLN nasal spray Place 1 spray into both nostrils as needed for congestion.  . [DISCONTINUED] levocetirizine (XYZAL) 5 MG tablet Take 1 tablet by mouth at bedtime.   No facility-administered encounter medications on file as of 02/07/2020.    Right elbow pain to suspect tennis elbow: Repetitive elbow movement at work and likely aggravating by painting  a room and then new start weight lifting at the gym. She has tried occasional NSAID. Elbow band helps when she has worn it.  She has local tenderness directly over the lateral epicondyle. Pain brought on by flexion- not so much resisted wrist extension or radial deviation. Pain aggravated by action of pulling up a liquid in a syringe. She has normal ROM elbow and no swelling, bruising, erythema or bony abnormality of the elbow. No other joints bothersome. She would like to see Ortho if not improving as the elbow is bothering her very much at work. Mobic as needed 1 per day 5-7 days.  Monitor for stomach upset and stop if it bothers you. Hx of GERD noted- not bothersome. Wear the elbow strap at work and monitor if it helps. Take off at home and at night.    Anxiety/Question of ADHD and needs formal evaluation by trained psychologist. Placed a referral for testing for ADD testing [facility] in [place].  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 We discussed that Xanax is a controlled substance and can be habit forming. FH positive for addiction. She is well aware and report she gets in such a panic on planes that she needs medication. She denies a prior hx of addiction.    Follow-up: Return in about 3 months (around 05/08/2020).   Amedeo Kinsman, NP

## 2020-02-28 MED FILL — FLUCONAZOLE 150 MG TABS: 150 | 4 days supply | Qty: 2 | Fill #0

## 2020-02-28 MED FILL — AMOX-CLAV 875-125 MG TABLET: 875-125 | 10 days supply | Qty: 20 | Fill #0

## 2020-02-29 ENCOUNTER — Telehealth: Payer: Self-pay | Admitting: Nurse Practitioner

## 2020-02-29 NOTE — Telephone Encounter (Signed)
Left vm on referral [facility]  vm regarding pt referral.

## 2020-03-03 ENCOUNTER — Telehealth: Payer: Self-pay | Admitting: Nurse Practitioner

## 2020-03-03 NOTE — Telephone Encounter (Signed)
I spoke with [facility] they spoke to pt on 02/22/2020 now waiting for pt to send paper work back to sch. I also called pt and left vm for pt to send paper work in so she can get scheduled.

## 2020-03-20 ENCOUNTER — Telehealth: Payer: Self-pay | Admitting: Nurse Practitioner

## 2020-03-20 ENCOUNTER — Other Ambulatory Visit: Payer: Self-pay | Admitting: Family Medicine

## 2020-03-20 MED ORDER — OMEPRAZOLE 20 MG PO CPDR: 20.0000 mg | DELAYED_RELEASE_CAPSULE | Freq: Every day | ORAL | 0 refills | Status: DC

## 2020-03-20 MED ORDER — BUPROPION HCL ER (SR) 150 MG PO TB12: 150.0000 mg | ORAL_TABLET | Freq: Every day | ORAL | 0 refills | Status: DC

## 2020-03-20 NOTE — Telephone Encounter (Signed)
Patient needs the following refills; omeprazole (PRILOSEC) 20 MG capsule and buPROPion (WELLBUTRIN SR) 150 MG 12 hr tablet.

## 2020-04-07 ENCOUNTER — Other Ambulatory Visit: Payer: Self-pay | Admitting: Nurse Practitioner

## 2020-04-07 ENCOUNTER — Telehealth: Payer: Self-pay | Admitting: Nurse Practitioner

## 2020-04-07 DIAGNOSIS — L600 Ingrowing nail: Secondary | ICD-10-CM

## 2020-04-07 NOTE — Telephone Encounter (Signed)
I sent in the referral to [facility].

## 2020-04-07 NOTE — Telephone Encounter (Signed)
Pt called wanting a referral sent to podiatry at [facility] with Dr. Alberteen Spindle for in-grown toe nails

## 2020-04-07 NOTE — Telephone Encounter (Signed)
Patient would like referral to podiatry at [facility] to see Dr. Alberteen Spindle. Patient has in-grown toe nails; can referral be done or do you need to see patient first?

## 2020-04-08 NOTE — Telephone Encounter (Signed)
Patient aware referral was sent

## 2020-05-08 ENCOUNTER — Other Ambulatory Visit: Payer: Self-pay

## 2020-05-08 ENCOUNTER — Encounter: Payer: Self-pay | Admitting: Nurse Practitioner

## 2020-05-08 ENCOUNTER — Ambulatory Visit (INDEPENDENT_AMBULATORY_CARE_PROVIDER_SITE_OTHER): Payer: No Typology Code available for payment source | Admitting: Nurse Practitioner

## 2020-05-08 VITALS — BP 110/66 | HR 90 | Temp 97.9°F | Ht 67.0 in | Wt 187.0 lb

## 2020-05-08 DIAGNOSIS — Z6829 Body mass index (BMI) 29.0-29.9, adult: Secondary | ICD-10-CM | POA: Diagnosis not present

## 2020-05-08 DIAGNOSIS — F419 Anxiety disorder, unspecified: Secondary | ICD-10-CM

## 2020-05-08 DIAGNOSIS — Z1159 Encounter for screening for other viral diseases: Secondary | ICD-10-CM | POA: Diagnosis not present

## 2020-05-08 DIAGNOSIS — R002 Palpitations: Secondary | ICD-10-CM

## 2020-05-08 DIAGNOSIS — F329 Major depressive disorder, single episode, unspecified: Secondary | ICD-10-CM | POA: Diagnosis not present

## 2020-05-08 DIAGNOSIS — F32A Depression, unspecified: Secondary | ICD-10-CM

## 2020-05-08 DIAGNOSIS — R7401 Elevation of levels of liver transaminase levels: Secondary | ICD-10-CM

## 2020-05-08 NOTE — Patient Instructions (Addendum)
Please go to the lab today for routine laboratory studies.  Continue with plans for your ADD testing.  I placed a referral into psychiatry.  Your EKG looked stable form the past.  I placed a Cardiology referral for heart palpitations associated with shortness of breath when you were not feeling anxious.   Avoid caffeine and energy drinks.   Please follow up in 3 mos.   I recommend Covid vaccine.  Managing Anxiety, Adult After being diagnosed with an anxiety disorder, you may be relieved to know why you have felt or behaved a certain way. You may also feel overwhelmed about the treatment ahead and what it will mean for your life. With care and support, you can manage this condition and recover from it. How to manage lifestyle changes Managing stress and anxiety  Stress is your body's reaction to life changes and events, both good and bad. Most stress will last just a few hours, but stress can be ongoing and can lead to more than just stress. Although stress can play a major role in anxiety, it is not the same as anxiety. Stress is usually caused by something external, such as a deadline, test, or competition. Stress normally passes after the triggering event has ended.  Anxiety is caused by something internal, such as imagining a terrible outcome or worrying that something will go wrong that will devastate you. Anxiety often does not go away even after the triggering event is over, and it can become long-term (chronic) worry. It is important to understand the differences between stress and anxiety and to manage your stress effectively so that it does not lead to an anxious response. Talk with your health care provider or a counselor to learn more about reducing anxiety and stress. He or she may suggest tension reduction techniques, such as:  Music therapy. This can include creating or listening to music that you enjoy and that inspires you.  Mindfulness-based meditation. This involves being  aware of your normal breaths while not trying to control your breathing. It can be done while sitting or walking.  Centering prayer. This involves focusing on a word, phrase, or sacred image that means something to you and brings you peace.  Deep breathing. To do this, expand your stomach and inhale slowly through your nose. Hold your breath for 3-5 seconds. Then exhale slowly, letting your stomach muscles relax.  Self-talk. This involves identifying thought patterns that lead to anxiety reactions and changing those patterns.  Muscle relaxation. This involves tensing muscles and then relaxing them. Choose a tension reduction technique that suits your lifestyle and personality. These techniques take time and practice. Set aside 5-15 minutes a day to do them. Therapists can offer counseling and training in these techniques. The training to help with anxiety may be covered by some insurance plans. Other things you can do to manage stress and anxiety include:  Keeping a stress/anxiety diary. This can help you learn what triggers your reaction and then learn ways to manage your response.  Thinking about how you react to certain situations. You may not be able to control everything, but you can control your response.  Making time for activities that help you relax and not feeling guilty about spending your time in this way.  Visual imagery and yoga can help you stay calm and relax.  Medicines Medicines can help ease symptoms. Medicines for anxiety include:  Anti-anxiety drugs.  Antidepressants. Medicines are often used as a primary treatment for anxiety disorder. Medicines will  be prescribed by a health care provider. When used together, medicines, psychotherapy, and tension reduction techniques may be the most effective treatment. Relationships Relationships can play a big part in helping you recover. Try to spend more time connecting with trusted friends and family members. Consider going to  couples counseling, taking family education classes, or going to family therapy. Therapy can help you and others better understand your condition. How to recognize changes in your anxiety Everyone responds differently to treatment for anxiety. Recovery from anxiety happens when symptoms decrease and stop interfering with your daily activities at home or work. This may mean that you will start to:  Have better concentration and focus. Worry will interfere less in your daily thinking.  Sleep better.  Be less irritable.  Have more energy.  Have improved memory. It is important to recognize when your condition is getting worse. Contact your health care provider if your symptoms interfere with home or work and you feel like your condition is not improving. Follow these instructions at home: Activity  Exercise. Most adults should do the following: ? Exercise for at least 150 minutes each week. The exercise should increase your heart rate and make you sweat (moderate-intensity exercise). ? Strengthening exercises at least twice a week.  Get the right amount and quality of sleep. Most adults need 7-9 hours of sleep each night. Lifestyle   Eat a healthy diet that includes plenty of vegetables, fruits, whole grains, low-fat dairy products, and lean protein. Do not eat a lot of foods that are high in solid fats, added sugars, or salt.  Make choices that simplify your life.  Do not use any products that contain nicotine or tobacco, such as cigarettes, e-cigarettes, and chewing tobacco. If you need help quitting, ask your health care provider.  Avoid caffeine, alcohol, and certain over-the-counter cold medicines. These may make you feel worse. Ask your pharmacist which medicines to avoid. General instructions  Take over-the-counter and prescription medicines only as told by your health care provider.  Keep all follow-up visits as told by your health care provider. This is important. Where to  find support You can get help and support from these sources:  Self-help groups.  Online and [employer].  A trusted spiritual leader.  Couples counseling.  Family education classes.  Family therapy. Where to find more information You may find that joining a support group helps you deal with your anxiety. The following sources can help you locate counselors or support groups near you:  Mental Health America: www.mentalhealthamerica.net  Anxiety and Depression Association of [place] (ADAA): ProgramCam.de  [employer] Mental [employer]ness (NAMI): www.nami.org Contact a health care provider if you:  Have a hard time staying focused or finishing daily tasks.  Spend many hours a day feeling worried about everyday life.  Become exhausted by worry.  Start to have headaches, feel tense, or have nausea.  Urinate more than normal.  Have diarrhea. Get help right away if you have:  A racing heart and shortness of breath.  Thoughts of hurting yourself or others. If you ever feel like you may hurt yourself or others, or have thoughts about taking your own life, get help right away. You can go to your nearest emergency department or call:  Your local emergency services (911 in the [place]  A suicide crisis helpline, such as the National Suicide Prevention Lifeline at (928)104-7482. This is open 24 hours a day. Summary  Taking steps to learn and use tension reduction techniques can  help calm you and help prevent triggering an anxiety reaction.  When used together, medicines, psychotherapy, and tension reduction techniques may be the most effective treatment.  Family, friends, and partners can play a big part in helping you recover from an anxiety disorder. This information is not intended to replace advice given to you by your health care provider. Make sure you discuss any questions you have with your health care provider. Document Revised: 03/20/2019 Document  Reviewed: 03/20/2019 Elsevier Patient Education  2020 [employer].  Palpitations Palpitations are feelings that your heartbeat is irregular or is faster than normal. It may feel like your heart is fluttering or skipping a beat. Palpitations are usually not a serious problem. They may be caused by many things, including smoking, caffeine, alcohol, stress, and certain medicines or drugs. Most causes of palpitations are not serious. However, some palpitations can be a sign of a serious problem. You may need further tests to rule out serious medical problems. Follow these instructions at home:     Pay attention to any changes in your condition. Take these actions to help manage your symptoms: Eating and drinking  Avoid foods and drinks that may cause palpitations. These may include: ? Caffeinated coffee, tea, soft drinks, diet pills, and energy drinks. ? Chocolate. ? Alcohol. Lifestyle  Take steps to reduce your stress and anxiety. Things that can help you relax include: ? Yoga. ? Mind-body activities, such as deep breathing, meditation, or using words and images to create positive thoughts (guided imagery). ? Physical activity, such as swimming, jogging, or walking. Tell your health care provider if your palpitations increase with activity. If you have chest pain or shortness of breath with activity, do not continue the activity until you are seen by your health care provider. ? Biofeedback. This is a method that helps you learn to use your mind to control things in your body, such as your heartbeat.  Do not use drugs, including cocaine or ecstasy. Do not use marijuana.  Get plenty of rest and sleep. Keep a regular bed time. General instructions  Take over-the-counter and prescription medicines only as told by your health care provider.  Do not use any products that contain nicotine or tobacco, such as cigarettes and e-cigarettes. If you need help quitting, ask your health care  provider.  Keep all follow-up visits as told by your health care provider. This is important. These may include visits for further testing if palpitations do not go away or get worse. Contact a health care provider if you:  Continue to have a fast or irregular heartbeat after 24 hours.  Notice that your palpitations occur more often. Get help right away if you:  Have chest pain or shortness of breath.  Have a severe headache.  Feel dizzy or you faint. Summary  Palpitations are feelings that your heartbeat is irregular or is faster than normal. It may feel like your heart is fluttering or skipping a beat.  Palpitations may be caused by many things, including smoking, caffeine, alcohol, stress, certain medicines, and drugs.  Although most causes of palpitations are not serious, some causes can be a sign of a serious medical problem.  Get help right away if you faint or have chest pain, shortness of breath, a severe headache, or dizziness. This information is not intended to replace advice given to you by your health care provider. Make sure you discuss any questions you have with your health care provider. Document Revised: 11/30/2017 Document Reviewed: 11/30/2017 Elsevier  Patient Education  [employer].  Living With Depression Everyone experiences occasional disappointment, sadness, and loss in their lives. When you are feeling down, blue, or sad for at least 2 weeks in a row, it may mean that you have depression. Depression can affect your thoughts and feelings, relationships, daily activities, and physical health. It is caused by changes in the way your brain functions. If you receive a diagnosis of depression, your health care provider will tell you which type of depression you have and what treatment options are available to you. If you are living with depression, there are ways to help you recover from it and also ways to prevent it from coming back. How to cope with  lifestyle changes Coping with stress     Stress is your body's reaction to life changes and events, both good and bad. Stressful situations may include:  Getting married.  The death of a spouse.  Losing a job.  Retiring.  Having a baby. Stress can last just a few hours or it can be ongoing. Stress can play a major role in depression, so it is important to learn both how to cope with stress and how to think about it differently. Talk with your health care provider or a counselor if you would like to learn more about stress reduction. He or she may suggest some stress reduction techniques, such as:  Music therapy. This can include creating music or listening to music. Choose music that you enjoy and that inspires you.  Mindfulness-based meditation. This kind of meditation can be done while sitting or walking. It involves being aware of your normal breaths, rather than trying to control your breathing.  Centering prayer. This is a kind of meditation that involves focusing on a spiritual word or phrase. Choose a word, phrase, or sacred image that is meaningful to you and that brings you peace.  Deep breathing. To do this, expand your stomach and inhale slowly through your nose. Hold your breath for 3-5 seconds, then exhale slowly, allowing your stomach muscles to relax.  Muscle relaxation. This involves intentionally tensing muscles then relaxing them. Choose a stress reduction technique that fits your lifestyle and personality. Stress reduction techniques take time and practice to develop. Set aside 5-15 minutes a day to do them. Therapists can offer training in these techniques. The training may be covered by some insurance plans. Other things you can do to manage stress include:  Keeping a stress diary. This can help you learn what triggers your stress and ways to control your response.  Understanding what your limits are and saying no to requests or events that lead to a schedule that  is too full.  Thinking about how you respond to certain situations. You may not be able to control everything, but you can control how you react.  Adding humor to your life by watching funny films or TV shows.  Making time for activities that help you relax and not feeling guilty about spending your time this way.  Medicines Your health care provider may suggest certain medicines if he or she feels that they will help improve your condition. Avoid using alcohol and other substances that may prevent your medicines from working properly (may interact). It is also important to:  Talk with your pharmacist or health care provider about all the medicines that you take, their possible side effects, and what medicines are safe to take together.  Make it your goal to take part in all treatment  decisions (shared decision-making). This includes giving input on the side effects of medicines. It is best if shared decision-making with your health care provider is part of your total treatment plan. If your health care provider prescribes a medicine, you may not notice the full benefits of it for 4-8 weeks. Most people who are treated for depression need to be on medicine for at least 6-12 months after they feel better. If you are taking medicines as part of your treatment, do not stop taking medicines without first talking to your health care provider. You may need to have the medicine slowly decreased (tapered) over time to decrease the risk of harmful side effects. Relationships Your health care provider may suggest family therapy along with individual therapy and drug therapy. While there may not be family problems that are causing you to feel depressed, it is still important to make sure your family learns as much as they can about your mental health. Having your family's support can help make your treatment successful. How to recognize changes in your condition Everyone has a different response to treatment  for depression. Recovery from major depression happens when you have not had signs of major depression for two months. This may mean that you will start to:  Have more interest in doing activities.  Feel less hopeless than you did 2 months ago.  Have more energy.  Overeat less often, or have better or improving appetite.  Have better concentration. Your health care provider will work with you to decide the next steps in your recovery. It is also important to recognize when your condition is getting worse. Watch for these signs:  Having fatigue or low energy.  Eating too much or too little.  Sleeping too much or too little.  Feeling restless, agitated, or hopeless.  Having trouble concentrating or making decisions.  Having unexplained physical complaints.  Feeling irritable, angry, or aggressive. Get help as soon as you or your family members notice these symptoms coming back. How to get support and help from others How to talk with friends and family members about your condition  Talking to friends and family members about your condition can provide you with one way to get support and guidance. Reach out to trusted friends or family members, explain your symptoms to them, and let them know that you are working with a health care provider to treat your depression. Financial resources Not all insurance plans cover mental health care, so it is important to check with your insurance carrier. If paying for co-pays or counseling services is a problem, search for a local or county mental health care center. They may be able to offer public mental health care services at low or no cost when you are not able to see a private health care provider. If you are taking medicine for depression, you may be able to get the generic form, which may be less expensive. Some makers of prescription medicines also offer help to patients who cannot afford the medicines they need. Follow these instructions  at home:   Get the right amount and quality of sleep.  Cut down on using caffeine, tobacco, alcohol, and other potentially harmful substances.  Try to exercise, such as walking or lifting small weights.  Take over-the-counter and prescription medicines only as told by your health care provider.  Eat a healthy diet that includes plenty of vegetables, fruits, whole grains, low-fat dairy products, and lean protein. Do not eat a lot of foods that are  high in solid fats, added sugars, or salt.  Keep all follow-up visits as told by your health care provider. This is important. Contact a health care provider if:  You stop taking your antidepressant medicines, and you have any of these symptoms: ? Nausea. ? Headache. ? Feeling lightheaded. ? Chills and body aches. ? Not being able to sleep (insomnia).  You or your friends and family think your depression is getting worse. Get help right away if:  You have thoughts of hurting yourself or others. If you ever feel like you may hurt yourself or others, or have thoughts about taking your own life, get help right away. You can go to your nearest emergency department or call:  Your local emergency services (911 in the [place]  A suicide crisis helpline, such as the National Suicide Prevention Lifeline at 762-704-6021. This is open 24-hours a day. Summary  If you are living with depression, there are ways to help you recover from it and also ways to prevent it from coming back.  Work with your health care team to create a management plan that includes counseling, stress management techniques, and healthy lifestyle habits. This information is not intended to replace advice given to you by your health care provider. Make sure you discuss any questions you have with your health care provider. Document Revised: 02/09/2019 Document Reviewed: 09/20/2016 Elsevier Patient Education  2020 [employer].

## 2020-05-08 NOTE — Progress Notes (Signed)
Established Patient Office Visit  Subjective:  Patient ID: Michele Houston, female    DOB: [birth year]  Age: 34 y.o. MRN: [id number]  CC:  Chief Complaint  Patient presents with  . Follow-up    HPI  Michele Houston is a  34 year old patient returns for follow-up visit.  Her main concerns today are the depression anxiety:  02/07/2020: Presented to establish care.  Taking Wellbutrin 150 mg daily.  She was talking with therapist at [facility]. She took a trip to [place] [employer], utilized Xanax 0.25 mg for fear of flying and did great.  Depression anxiety: Presents on Wellbutrin 150 mg daily.  She reports her depression is improved, but her anxiety may be worse.  Her main problem with anxiety is irritability.  She feels so irritable that any little thing will set her off.  She is also having what she thinks may be panic attacks.  She was visiting the [facility] on Monday, was feeling relaxed and all of a sudden her heart started to skipped beats, pumped really fast, she felt her pulse go up to 154 and she had some shortness of breath and felt a little weak and faint with this.  This is happened in the past  - once a month for a few years.  No history of cardiology evaluation.  She was calm and enjoying her day.  She does consume caffeine and has been drinking something called Herbalife energy drinks with natural caffeine.  She did not drink 1 of those today and plans to hold off on drinking caffeine and energy drinks that in case there is any association.   She can recall taking Lexapro for the first time at about 34 years old.  She felt suicidal on Lexapro and had to come off of it.  She took Celexa as well in the past and did not like it.  She reports childhood trauma, and her sister committed suicide a few years ago. She has plans for ADD testing next week.   Past Medical History:  Diagnosis Date  . Anemia    when pregnant only  . GERD (gastroesophageal reflux disease)     Past Surgical  History:  Procedure Laterality Date  . CHOLECYSTECTOMY N/A 06/03/2015   Procedure: LAPAROSCOPIC CHOLECYSTECTOMY WITH INTRAOPERATIVE CHOLANGIOGRAM;  Surgeon: Earline Mayotte, MD;  Location: [facility] ORS;  Service: General;  Laterality: N/A;  . KNEE SURGERY Left 2011   arthroscopy  . tubes in ears  baby    Family History  Problem Relation Age of Onset  . Hyperthyroidism Mother   . Depression Mother   . Lung cancer Father   . Depression Father   . Drug abuse Father   . Lung cancer Paternal Uncle   . Lung cancer Paternal Grandmother   . Cervical cancer Sister   . Hypertension Brother     Social History   Socioeconomic History  . Marital status: Married    Spouse name: Not on file  . Number of children: Not on file  . Years of education: Not on file  . Highest education level: Not on file  Occupational History  . Occupation: Chief Executive Officer: [employer]  Tobacco Use  . Smoking status: Former Smoker    Packs/day: 1.00    Years: 4.00    Pack years: 4.00    Types: Cigarettes    Quit date: 05/30/2011    Years since quitting: 8.9  . Smokeless tobacco: Never Used  Vaping Use  .  Vaping Use: Never used  Substance and Sexual Activity  . Alcohol use: Not Currently    Alcohol/week: 0.0 standard drinks  . Drug use: Never  . Sexual activity: Yes    Birth control/protection: None    Comment: Vasectomy in husband  Other Topics Concern  . Not on file  Social History Narrative  . Not on file   Social Determinants of Health   Financial Resource Strain:   . Difficulty of Paying Living Expenses:   Food Insecurity:   . Worried About Programme researcher, broadcasting/film/videounning Out of Food in the Last Year:   . Baristaan Out of Food in the Last Year:   Transportation Needs:   . Freight forwarderLack of Transportation (Medical):   Marland Kitchen. Lack of Transportation (Non-Medical):   Physical Activity:   . Days of Exercise per Week:   . Minutes of Exercise per Session:   Stress:   . Feeling of Stress :   Social Connections:   . Frequency  of Communication with Friends and Family:   . Frequency of Social Gatherings with Friends and Family:   . Attends Religious Services:   . Active Member of Clubs or Organizations:   . Attends BankerClub or Organization Meetings:   Marland Kitchen. Marital Status:   Intimate Partner Violence:   . Fear of Current or Ex-Partner:   . Emotionally Abused:   Marland Kitchen. Physically Abused:   . Sexually Abused:     Outpatient Medications Prior to Visit  Medication Sig Dispense Refill  . ALPRAZolam (XANAX) 0.25 MG tablet Take 1 tablet 30 min before your flight and may repeat x 1  as needed. 4 tablet 0  . buPROPion (WELLBUTRIN SR) 150 MG 12 hr tablet Take 1 tablet (150 mg total) by mouth daily. 90 tablet 0  . cetirizine (ZYRTEC) 10 MG tablet Take 10 mg by mouth daily.    . meloxicam (MOBIC) 7.5 MG tablet Take 1 tablet (7.5 mg total) by mouth daily. 7 tablet 0  . omeprazole (PRILOSEC) 20 MG capsule Take 1 capsule (20 mg total) by mouth daily. 90 capsule 0  . fluticasone (FLONASE) 50 MCG/ACT nasal spray Place 2 sprays into both nostrils daily. (Patient not taking: Reported on 02/07/2020) 16 g 3  . sodium chloride (OCEAN) 0.65 % SOLN nasal spray Place 1 spray into both nostrils as needed for congestion.     No facility-administered medications prior to visit.    Allergies  Allergen Reactions  . Ceclor [Cefaclor] Hives  . Tri-Sprintec [Norgestimate-Eth Estradiol] Other (See Comments)    Headaches, increased anxiety    Review of Systems  Constitutional: Negative for chills and fever.  HENT: Negative.   Respiratory: Negative for cough and shortness of breath.   Cardiovascular: Positive for palpitations. Negative for chest pain and leg swelling.  Gastrointestinal: Negative for abdominal pain.  Endocrine: Negative.   Genitourinary: Negative.   Musculoskeletal: Negative.   Neurological: Negative for seizures and headaches.  Hematological: Negative.   Psychiatric/Behavioral:       PHQ-9: 9 GAD-7: 10.  She denies any suicidal  or homicidal ideation.      Objective:    Physical Exam Vitals reviewed.  Constitutional:      Appearance: Normal appearance.  Eyes:     Pupils: Pupils are equal, round, and reactive to light.  Cardiovascular:     Rate and Rhythm: Normal rate and regular rhythm.     Pulses: Normal pulses.     Heart sounds: Normal heart sounds.  Pulmonary:     Effort: Pulmonary  effort is normal.     Breath sounds: Normal breath sounds.  Musculoskeletal:     Cervical back: Normal range of motion.  Skin:    General: Skin is warm and dry.  Neurological:     General: No focal deficit present.     Mental Status: She is alert and oriented to person, place, and time.  Psychiatric:        Mood and Affect: Mood normal.        Behavior: Behavior normal.        Thought Content: Thought content normal.        Judgment: Judgment normal.     BP 110/66 (BP Location: Left Arm, Patient Position: Sitting, Cuff Size: Normal)   Pulse 90   Temp 97.9 F (36.6 C) (Oral)   Ht 5\' 7"  (1.702 m)   Wt 187 lb (84.8 kg)   SpO2 99%   BMI 29.29 kg/m  Wt Readings from Last 3 Encounters:  05/08/20 187 lb (84.8 kg)  02/07/20 191 lb 3.2 oz (86.7 kg)  10/31/19 201 lb (91.2 kg)     Health Maintenance Due  Topic Date Due  . Hepatitis C Screening  Never done  . COVID-19 Vaccine (1) Never done    There are no preventive care reminders to display for this patient.  Lab Results  Component Value Date   TSH 1.14 03/29/2019   Lab Results  Component Value Date   WBC 5.0 03/29/2019   HGB 14.2 03/29/2019   HCT 41.9 03/29/2019   MCV 83.9 03/29/2019   PLT 311.0 03/29/2019   Lab Results  Component Value Date   NA 140 03/29/2019   K 4.5 03/29/2019   CO2 29 03/29/2019   GLUCOSE 80 03/29/2019   BUN 12 03/29/2019   CREATININE 1.10 03/29/2019   BILITOT 0.5 03/29/2019   ALKPHOS 72 03/29/2019   AST 29 03/29/2019   ALT 36 (H) 03/29/2019   PROT 7.1 03/29/2019   ALBUMIN 4.5 03/29/2019   CALCIUM 9.9 03/29/2019    ANIONGAP 12 05/25/2015   GFR 58.00 (L) 03/29/2019   Lab Results  Component Value Date   CHOL 198 03/29/2019   Lab Results  Component Value Date   HDL 75.50 03/29/2019   Lab Results  Component Value Date   LDLCALC 100 (H) 03/29/2019   Lab Results  Component Value Date   TRIG 111.0 03/29/2019   Lab Results  Component Value Date   CHOLHDL 3 03/29/2019   No results found for: HGBA1C    Assessment & Plan:   Problem List Items Addressed This Visit      Other   Anxiety and depression - Primary   Relevant Orders   VITAMIN D 25 Hydroxy (Vit-D Deficiency, Fractures)   B12 and Folate Panel   BMI 29.0-29.9,adult   Relevant Orders   Hemoglobin A1c   Lipid panel   Heart palpitations   Relevant Orders   EKG 12-Lead   TSH   Comprehensive metabolic panel   CBC with Differential/Platelet   Need for hepatitis C screening test   Relevant Orders   Hepatitis C antibody      No orders of the defined types were placed in this encounter.  EKG today shows a sinus rhythm rate of 66 left atrial enlargement, no acute ST-T wave changes.  This EKG was compared to previous study and looks unchanged.  Please go to the lab today for routine laboratory studies.  Continue with plans for your ADD testing.  I  placed a referral into psychiatry.  Your EKG  looked stable from the past.  I placed a Cardiology referral for heart palpitations associated with shortness of breath when you were not feeling anxious.   Avoid caffeine and energy drinks.   Please follow up in 3 mos.  I recommend Covid vaccine.    I provided  30 minutes of non-face-to-face time during this encounter reviewing patient's current problems and past surgeries, labs and imaging studies, providing counseling on the above mentioned problems , and coordination  of care .  Follow-up: No follow-ups on file.  This visit occurred during the SARS-CoV-2 public health emergency.  Safety protocols were in place, including  screening questions prior to the visit, additional usage of staff PPE, and extensive cleaning of exam room while observing appropriate contact time as indicated for disinfecting solutions.    Amedeo Kinsman, NP

## 2020-05-09 ENCOUNTER — Encounter: Payer: Self-pay | Admitting: Nurse Practitioner

## 2020-05-09 LAB — LIPID PANEL
Cholesterol: 181 mg/dL (ref 0–200)
HDL: 81.8 mg/dL (ref >39.00)
LDL Cholesterol: 86 mg/dL (ref 0–99)
NonHDL: 99.18
Total CHOL/HDL Ratio: 2
Triglycerides: 64 mg/dL (ref 0.0–149.0)
VLDL: 12.8 mg/dL (ref 0.0–40.0)

## 2020-05-09 LAB — COMPREHENSIVE METABOLIC PANEL
ALT: 41 U/L — ABNORMAL HIGH (ref 0–35)
AST: 29 U/L (ref 0–37)
Albumin: 4.7 g/dL (ref 3.5–5.2)
Alkaline Phosphatase: 58 U/L (ref 39–117)
BUN: 14 mg/dL (ref 6–23)
CO2: 29 mEq/L (ref 19–32)
Calcium: 9.9 mg/dL (ref 8.4–10.5)
Chloride: 102 mEq/L (ref 96–112)
Creatinine, Ser: 1 mg/dL (ref 0.40–1.20)
GFR: 64.28 mL/min (ref >60.00)
Glucose, Bld: 84 mg/dL (ref 70–99)
Potassium: 3.9 mEq/L (ref 3.5–5.1)
Sodium: 139 mEq/L (ref 135–145)
Total Bilirubin: 0.2 mg/dL (ref 0.2–1.2)
Total Protein: 6.8 g/dL (ref 6.0–8.3)

## 2020-05-09 LAB — B12 AND FOLATE PANEL
Folate: 11.5 ng/mL (ref >5.9)
Vitamin B-12: 427 pg/mL (ref 211–911)

## 2020-05-09 LAB — CBC WITH DIFFERENTIAL/PLATELET
Basophils Absolute: 0 10*3/uL (ref 0.0–0.1)
Basophils Relative: 0.5 % (ref 0.0–3.0)
Eosinophils Absolute: 0.1 10*3/uL (ref 0.0–0.7)
Eosinophils Relative: 2.1 % (ref 0.0–5.0)
HCT: 36.5 % (ref 36.0–46.0)
Hemoglobin: 12 g/dL (ref 12.0–15.0)
Lymphocytes Relative: 34 % (ref 12.0–46.0)
Lymphs Abs: 2.2 10*3/uL (ref 0.7–4.0)
MCHC: 32.9 g/dL (ref 30.0–36.0)
MCV: 78.7 fl (ref 78.0–100.0)
Monocytes Absolute: 0.5 10*3/uL (ref 0.1–1.0)
Monocytes Relative: 6.9 % (ref 3.0–12.0)
Neutro Abs: 3.7 10*3/uL (ref 1.4–7.7)
Neutrophils Relative %: 56.5 % (ref 43.0–77.0)
Platelets: 289 10*3/uL (ref 150.0–400.0)
RBC: 4.64 Mil/uL (ref 3.87–5.11)
RDW: 15.7 % — ABNORMAL HIGH (ref 11.5–15.5)
WBC: 6.5 10*3/uL (ref 4.0–10.5)

## 2020-05-09 LAB — HEMOGLOBIN A1C: Hgb A1c MFr Bld: 5.6 % (ref 4.6–6.5)

## 2020-05-09 LAB — HEPATITIS C ANTIBODY
Hepatitis C Ab: NONREACTIVE
SIGNAL TO CUT-OFF: 0.01 (ref <1.00)

## 2020-05-09 LAB — TSH: TSH: 0.92 u[IU]/mL (ref 0.35–4.50)

## 2020-05-09 LAB — VITAMIN D 25 HYDROXY (VIT D DEFICIENCY, FRACTURES): VITD: 33.73 ng/mL (ref 30.00–100.00)

## 2020-05-13 ENCOUNTER — Other Ambulatory Visit (INDEPENDENT_AMBULATORY_CARE_PROVIDER_SITE_OTHER): Payer: No Typology Code available for payment source

## 2020-05-13 ENCOUNTER — Other Ambulatory Visit: Payer: Self-pay

## 2020-05-13 DIAGNOSIS — R7401 Elevation of levels of liver transaminase levels: Secondary | ICD-10-CM

## 2020-05-13 DIAGNOSIS — E611 Iron deficiency: Secondary | ICD-10-CM

## 2020-05-14 ENCOUNTER — Encounter: Payer: Self-pay | Admitting: Nurse Practitioner

## 2020-05-14 LAB — ANA W/REFLEX IF POSITIVE: Anti Nuclear Antibody (ANA): NEGATIVE

## 2020-05-15 ENCOUNTER — Telehealth: Payer: Self-pay | Admitting: Nurse Practitioner

## 2020-05-15 MED ORDER — FERROUS SULFATE 325 (65 FE) MG PO TBEC
325.0000 mg | DELAYED_RELEASE_TABLET | Freq: Every day | ORAL | 1 refills | Status: DC
Start: 2020-05-15 — End: 2024-03-12

## 2020-05-15 NOTE — Addendum Note (Signed)
Addended by: Amedeo Kinsman A on: 05/15/2020 02:26 PM   Modules accepted: Orders

## 2020-05-15 NOTE — Telephone Encounter (Signed)
See my lab result note to you. I called her and had voice mail. Please try her again.

## 2020-05-15 NOTE — Telephone Encounter (Signed)
Left message for patient

## 2020-05-15 NOTE — Progress Notes (Signed)
I ordered oral iron for low iron stores. Yes, this can cause mild fatigue. She is not anemic. Check stool for hidden blood. See notes on her Hepatitis questions.    Labs in 2 months to check iron response. She will need lab appt about 07/16/20.

## 2020-05-15 NOTE — Telephone Encounter (Signed)
See result note.  

## 2020-05-15 NOTE — Telephone Encounter (Signed)
Pt called to speak with Cala Bradford stated that she had called her

## 2020-05-21 LAB — HEPATITIS B CORE ANTIBODY, TOTAL: Hep B Core Total Ab: NONREACTIVE

## 2020-05-21 LAB — ALPHA-1 ANTITRYPSIN PHENOTYPE: A-1 Antitrypsin, Ser: 153 mg/dL (ref 83–199)

## 2020-05-21 LAB — HEPATITIS B SURFACE ANTIGEN: Hepatitis B Surface Ag: NONREACTIVE

## 2020-05-21 LAB — CELIAC DISEASE PANEL
(tTG) Ab, IgA: 1 U/mL
(tTG) Ab, IgG: 1 U/mL
Gliadin IgA: 2 Units
Gliadin IgG: 1 Units
Immunoglobulin A: 89 mg/dL (ref 47–310)

## 2020-05-21 LAB — IRON,TIBC AND FERRITIN PANEL
%SAT: 14 % (calc) — ABNORMAL LOW (ref 16–45)
Ferritin: 5 ng/mL — ABNORMAL LOW (ref 16–154)
Iron: 54 ug/dL (ref 40–190)
TIBC: 389 mcg/dL (calc) (ref 250–450)

## 2020-05-21 LAB — HEPATITIS A ANTIBODY, TOTAL: Hepatitis A AB,Total: REACTIVE — AB

## 2020-05-21 LAB — HEPATITIS B SURFACE ANTIBODY, QUANTITATIVE: Hep B S AB Quant (Post): >1000 m[IU]/mL (ref >=10)

## 2020-05-21 LAB — ANTI-SMOOTH MUSCLE ANTIBODY, IGG: Actin (Smooth Muscle) Antibody (IGG): <20 U (ref <20)

## 2020-05-21 LAB — PROTIME-INR
INR: 1
Prothrombin Time: 10.9 s (ref 9.0–11.5)

## 2020-05-21 LAB — CERULOPLASMIN: Ceruloplasmin: 29 mg/dL (ref 18–53)

## 2020-05-22 ENCOUNTER — Encounter: Payer: Self-pay | Admitting: Nurse Practitioner

## 2020-05-23 ENCOUNTER — Encounter: Payer: Self-pay | Admitting: Nurse Practitioner

## 2020-05-26 ENCOUNTER — Telehealth: Payer: Self-pay | Admitting: Nurse Practitioner

## 2020-05-26 NOTE — Telephone Encounter (Signed)
Left her a message to arrange f/up visit in 3-4 mos.

## 2020-05-30 ENCOUNTER — Encounter: Payer: Self-pay | Admitting: Nurse Practitioner

## 2020-06-27 ENCOUNTER — Ambulatory Visit (INDEPENDENT_AMBULATORY_CARE_PROVIDER_SITE_OTHER): Payer: No Typology Code available for payment source | Admitting: Cardiology

## 2020-06-27 ENCOUNTER — Encounter: Payer: Self-pay | Admitting: Cardiology

## 2020-06-27 ENCOUNTER — Other Ambulatory Visit: Payer: Self-pay

## 2020-06-27 VITALS — BP 122/74 | HR 65 | Ht 67.0 in | Wt 186.4 lb

## 2020-06-27 DIAGNOSIS — F419 Anxiety disorder, unspecified: Secondary | ICD-10-CM

## 2020-06-27 DIAGNOSIS — R002 Palpitations: Secondary | ICD-10-CM

## 2020-06-27 DIAGNOSIS — R079 Chest pain, unspecified: Secondary | ICD-10-CM

## 2020-06-27 NOTE — Patient Instructions (Signed)
Medication Instructions:  No Changes *If you need a refill on your cardiac medications before your next appointment, please call your pharmacy*   Lab Work: None Ordered If you have labs (blood work) drawn today and your tests are completely normal, you will receive your results only by: . MyChart Message (if you have MyChart) OR . A paper copy in the mail If you have any lab test that is abnormal or we need to change your treatment, we will call you to review the results.   Testing/Procedures: None Ordered   Follow-Up: At [facility], you and your health needs are our priority.  As part of our continuing mission to provide you with exceptional heart care, we have created designated Provider Care Teams.  These Care Teams include your primary Cardiologist (physician) and Advanced Practice Providers (APPs -  Physician Assistants and Nurse Practitioners) who all work together to provide you with the care you need, when you need it.  We recommend signing up for the patient portal called "MyChart".  Sign up information is provided on this After Visit Summary.  MyChart is used to connect with patients for Virtual Visits (Telemedicine).  Patients are able to view lab/test results, encounter notes, upcoming appointments, etc.  Non-urgent messages can be sent to your provider as well.   To learn more about what you can do with MyChart, go to [email or web address].    Your next appointment:   Follow up as needed   The format for your next appointment:   In Person  Provider:   Brian Agbor-Etang, MD   Other Instructions   

## 2020-06-27 NOTE — Progress Notes (Signed)
Cardiology Office Note:    Date:  06/27/2020   ID:  LEISEL PINETTE, DOB 05/16/1986, MRN [id number]  PCP:  Michele Nan, NP  [facility] Cardiologist:  Debbe Odea, MD  [facility] Electrophysiologist:  None   Referring MD: Michele Nan, NP   Chief Complaint  Patient presents with  . New Patient (Initial Visit)    Patient here to establish care due to palpitations, chest pains and SOB. Medications verbally reviewed with patient.    Michele Houston is a 34 y.o. female who is being seen today for the evaluation of palpitations at the request of Michele Nan, NP.   History of Present Illness:    Michele Houston is a 34 y.o. female with a hx of GERD who presents due to palpitations.  Patient states having on and off palpitations for the years now.  Last occurrence was 8 weeks ago.  She denies any presyncope, syncope, or shortness of breath.  Symptoms of palpitations usually lasts a couple of seconds and then go away.  She also endorses nonspecific chest discomfort not related with exertion.  Symptoms of chest pain which she describes as an ache, 3 out of 10 in severity got worse when Covid vaccine was mandated.  She denies any history of heart disease, she walks over 3 miles every week without any symptoms of chest pain or shortness of breath.  Denies smoking.  She states feeling anxious of late, is planning on seeing a specialist regarding anxiety.  Past Medical History:  Diagnosis Date  . Anemia    when pregnant only  . GERD (gastroesophageal reflux disease)     Past Surgical History:  Procedure Laterality Date  . CHOLECYSTECTOMY N/A 06/03/2015   Procedure: LAPAROSCOPIC CHOLECYSTECTOMY WITH INTRAOPERATIVE CHOLANGIOGRAM;  Surgeon: Michele Mayotte, MD;  Location: [facility] ORS;  Service: General;  Laterality: N/A;  . KNEE SURGERY Left 2011   arthroscopy  . tubes in ears  baby    Current Medications: Current Meds  Medication Sig  . buPROPion (WELLBUTRIN SR)  150 MG 12 hr tablet Take 1 tablet (150 mg total) by mouth daily.  . cetirizine (ZYRTEC) 10 MG tablet Take 10 mg by mouth daily.  . ferrous sulfate 325 (65 FE) MG EC tablet Take 1 tablet (325 mg total) by mouth daily.  . meloxicam (MOBIC) 15 MG tablet meloxicam 15 mg tablet  Take 1 tablet every day by oral route.  Marland Kitchen omeprazole (PRILOSEC) 20 MG capsule Take 1 capsule (20 mg total) by mouth daily.  Marland Kitchen VYVANSE 20 MG capsule Take 20 mg by mouth at bedtime.     Allergies:   Ceclor [cefaclor] and Tri-sprintec [norgestimate-eth estradiol]   Social History   Socioeconomic History  . Marital status: Married    Spouse name: Not on file  . Number of children: Not on file  . Years of education: Not on file  . Highest education level: Not on file  Occupational History  . Occupation: Chief Executive Officer:   Tobacco Use  . Smoking status: Former Smoker    Packs/day: 1.00    Years: 4.00    Pack years: 4.00    Types: Cigarettes    Quit date: 05/30/2011    Years since quitting: 9.0  . Smokeless tobacco: Never Used  Vaping Use  . Vaping Use: Never used  Substance and Sexual Activity  . Alcohol use: Not Currently    Alcohol/week: 0.0 standard drinks  . Drug  use: Never  . Sexual activity: Yes    Birth control/protection: None    Comment: Vasectomy in husband  Other Topics Concern  . Not on file  Social History Narrative  . Not on file   Social Determinants of Health   Financial Resource Strain:   . Difficulty of Paying Living Expenses: Not on file  Food Insecurity:   . Worried About Programme researcher, broadcasting/film/video in the Last Year: Not on file  . Ran Out of Food in the Last Year: Not on file  Transportation Needs:   . Lack of Transportation (Medical): Not on file  . Lack of Transportation (Non-Medical): Not on file  Physical Activity:   . Days of Exercise per Week: Not on file  . Minutes of Exercise per Session: Not on file  Stress:   . Feeling of Stress : Not on file  Social  Connections:   . Frequency of Communication with Friends and Family: Not on file  . Frequency of Social Gatherings with Friends and Family: Not on file  . Attends Religious Services: Not on file  . Active Member of Clubs or Organizations: Not on file  . Attends Banker Meetings: Not on file  . Marital Status: Not on file     Family History: The patient's family history includes Cervical cancer in her sister; Depression in her father and mother; Drug abuse in her father; Hypertension in her brother; Hyperthyroidism in her mother; Lung cancer in her father, paternal grandmother, and paternal uncle.  ROS:   Please see the history of present illness.     All other systems reviewed and are negative.  EKGs/Labs/Other Studies Reviewed:    The following studies were reviewed today:   EKG:  EKG is  ordered today.  The ekg ordered today demonstrates sinus rhythm, normal ECG.  Recent Labs: 05/08/2020: ALT 41; BUN 14; Creatinine, Ser 1.00; Hemoglobin 12.0; Platelets 289.0; Potassium 3.9; Sodium 139; TSH 0.92  Recent Lipid Panel    Component Value Date/Time   CHOL 181 05/08/2020 1517   TRIG 64.0 05/08/2020 1517   HDL 81.80 05/08/2020 1517   CHOLHDL 2 05/08/2020 1517   VLDL 12.8 05/08/2020 1517   LDLCALC 86 05/08/2020 1517    Physical Exam:    VS:  BP 122/74 (BP Location: Right Arm, Patient Position: Sitting, Cuff Size: Normal)   Pulse 65   Ht 5\' 7"  (1.702 m)   Wt 186 lb 6.4 oz (84.6 kg)   SpO2 98%   BMI 29.19 kg/m     Wt Readings from Last 3 Encounters:  06/27/20 186 lb 6.4 oz (84.6 kg)  05/08/20 187 lb (84.8 kg)  02/07/20 191 lb 3.2 oz (86.7 kg)     GEN:  Well nourished, well developed in no acute distress HEENT: Normal NECK: No JVD; No carotid bruits LYMPHATICS: No lymphadenopathy CARDIAC: RRR, no murmurs, rubs, gallops RESPIRATORY:  Clear to auscultation without rales, wheezing or rhonchi  ABDOMEN: Soft, non-tender, non-distended MUSCULOSKELETAL:  No edema;  No deformity  SKIN: Warm and dry NEUROLOGIC:  Alert and oriented x 3 PSYCHIATRIC:  Normal affect   ASSESSMENT:    1. Chest pain of uncertain etiology   2. Palpitations   3. Anxiety    PLAN:    In order of problems listed above:  1. Patient with symptoms of chest pain which appear very atypical.  She has no risk factors, is young.  I think anxiety is playing a role.  No indication for cardiac  testing at this time.  Patient reassured.  She was educated on cardiac presentation of chest pain.  If symptoms are to persist, or new symptoms occur will consider testing at that point. 2. With history of infrequent palpitations.  Last occurrence was 8 weeks ago.  I believe this is also anxiety related, no indication for cardiac monitor as episodes are very infrequent. 3. Possible history of anxiety.  Recommend she follows up with PCP and or specialist regarding management.  Follow-up as needed.  This note was generated in part or whole with voice recognition software. Voice recognition is usually quite accurate but there are transcription errors that can and very often do occur. I apologize for any typographical errors that were not detected and corrected.  Medication Adjustments/Labs and Tests Ordered: Current medicines are reviewed at length with the patient today.  Concerns regarding medicines are outlined above.  Orders Placed This Encounter  Procedures  . EKG 12-Lead   No orders of the defined types were placed in this encounter.   Patient Instructions  Medication Instructions:  No Changes *If you need a refill on your cardiac medications before your next appointment, please call your pharmacy*   Lab Work: None Ordered If you have labs (blood work) drawn today and your tests are completely normal, you will receive your results only by: Marland Kitchen MyChart Message (if you have MyChart) OR . A paper copy in the mail If you have any lab test that is abnormal or we need to change your  treatment, we will call you to review the results.   Testing/Procedures: None Ordered   Follow-Up: At [facility], you and your health needs are our priority.  As part of our continuing mission to provide you with exceptional heart care, we have created designated Provider Care Teams.  These Care Teams include your primary Cardiologist (physician) and Advanced Practice Providers (APPs -  Physician Assistants and Nurse Practitioners) who all work together to provide you with the care you need, when you need it.  We recommend signing up for the patient portal called "MyChart".  Sign up information is provided on this After Visit Summary.  MyChart is used to connect with patients for Virtual Visits (Telemedicine).  Patients are able to view lab/test results, encounter notes, upcoming appointments, etc.  Non-urgent messages can be sent to your provider as well.   To learn more about what you can do with MyChart, go to [email or web address].    Your next appointment:    Follow up as needed  The format for your next appointment:   In Person  Provider:   Debbe Odea, MD   Other Instructions      Signed, Debbe Odea, MD  06/27/2020 5:26 PM    [facility]

## 2020-07-14 ENCOUNTER — Other Ambulatory Visit: Payer: No Typology Code available for payment source

## 2020-07-16 ENCOUNTER — Telehealth (INDEPENDENT_AMBULATORY_CARE_PROVIDER_SITE_OTHER): Payer: No Typology Code available for payment source | Admitting: Psychiatry

## 2020-07-16 ENCOUNTER — Other Ambulatory Visit: Payer: Self-pay | Admitting: Psychiatry

## 2020-07-16 ENCOUNTER — Other Ambulatory Visit: Payer: Self-pay

## 2020-07-16 ENCOUNTER — Encounter: Payer: Self-pay | Admitting: Psychiatry

## 2020-07-16 ENCOUNTER — Other Ambulatory Visit: Payer: Self-pay | Admitting: Nurse Practitioner

## 2020-07-16 DIAGNOSIS — F909 Attention-deficit hyperactivity disorder, unspecified type: Secondary | ICD-10-CM

## 2020-07-16 DIAGNOSIS — F900 Attention-deficit hyperactivity disorder, predominantly inattentive type: Secondary | ICD-10-CM | POA: Insufficient documentation

## 2020-07-16 DIAGNOSIS — F3342 Major depressive disorder, recurrent, in full remission: Secondary | ICD-10-CM | POA: Diagnosis not present

## 2020-07-16 DIAGNOSIS — F411 Generalized anxiety disorder: Secondary | ICD-10-CM | POA: Insufficient documentation

## 2020-07-16 DIAGNOSIS — F40298 Other specified phobia: Secondary | ICD-10-CM | POA: Diagnosis not present

## 2020-07-16 DIAGNOSIS — F419 Anxiety disorder, unspecified: Secondary | ICD-10-CM | POA: Diagnosis not present

## 2020-07-16 MED ORDER — VENLAFAXINE HCL ER 37.5 MG PO CP24: 37.5000 mg | ORAL_CAPSULE | Freq: Every day | ORAL | 1 refills | Status: DC

## 2020-07-16 MED ORDER — BUPROPION HCL 75 MG PO TABS: 75.0000 mg | ORAL_TABLET | Freq: Every day | ORAL | 0 refills | Status: DC

## 2020-07-16 MED ORDER — PROPRANOLOL HCL 10 MG PO TABS: 10.0000 mg | ORAL_TABLET | Freq: Every day | ORAL | 1 refills | Status: DC | PRN
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
## 2020-07-16 NOTE — Patient Instructions (Addendum)
Venlafaxine extended-release capsules What is this medicine? VENLAFAXINE(VEN la fax een) is used to treat depression, anxiety and panic disorder. This medicine may be used for other purposes; ask your health care provider or pharmacist if you have questions. COMMON BRAND NAME(S): Effexor XR What should I tell my health care provider before I take this medicine? They need to know if you have any of these conditions:  bleeding disorders  glaucoma  heart disease  high blood pressure  high cholesterol  kidney disease  liver disease  low levels of sodium in the blood  mania or bipolar disorder  seizures  suicidal thoughts, plans, or attempt; a previous suicide attempt by you or a family  take medicines that treat or prevent blood clots  thyroid disease  an unusual or allergic reaction to venlafaxine, desvenlafaxine, other medicines, foods, dyes, or preservatives  pregnant or trying to get pregnant  breast-feeding How should I use this medicine? Take this medicine by mouth with a full glass of water. Follow the directions on the prescription label. Do not cut, crush, or chew this medicine. Take it with food. If needed, the capsule may be carefully opened and the entire contents sprinkled on a spoonful of cool applesauce. Swallow the applesauce/pellet mixture right away without chewing and follow with a glass of water to ensure complete swallowing of the pellets. Try to take your medicine at about the same time each day. Do not take your medicine more often than directed. Do not stop taking this medicine suddenly except upon the advice of your doctor. Stopping this medicine too quickly may cause serious side effects or your condition may worsen. A special MedGuide will be given to you by the pharmacist with each prescription and refill. Be sure to read this information carefully each time. Talk to your pediatrician regarding the use of this medicine in children. Special care may be  needed. Overdosage: If you think you have taken too much of this medicine contact a poison control center or emergency room at once. NOTE: This medicine is only for you. Do not share this medicine with others. What if I miss a dose? If you miss a dose, take it as soon as you can. If it is almost time for your next dose, take only that dose. Do not take double or extra doses. What may interact with this medicine? Do not take this medicine with any of the following medications:  certain medicines for fungal infections like fluconazole, itraconazole, ketoconazole, posaconazole, voriconazole  cisapride  desvenlafaxine  dronedarone  duloxetine  levomilnacipran  linezolid  MAOIs like Carbex, Eldepryl, Marplan, Nardil, and Parnate  methylene blue (injected into a vein)  milnacipran  pimozide  thioridazine This medicine may also interact with the following medications:  amphetamines  aspirin and aspirin-like medicines  certain medicines for depression, anxiety, or psychotic disturbances  certain medicines for migraine headaches like almotriptan, eletriptan, frovatriptan, naratriptan, rizatriptan, sumatriptan, zolmitriptan  certain medicines for sleep  certain medicines that treat or prevent blood clots like dalteparin, enoxaparin, warfarin  cimetidine  clozapine  diuretics  fentanyl  furazolidone  indinavir  isoniazid  lithium  metoprolol  NSAIDS, medicines for pain and inflammation, like ibuprofen or naproxen  other medicines that prolong the QT interval (cause an abnormal heart rhythm) like dofetilide, ziprasidone  procarbazine  rasagiline  supplements like St. John's wort, kava kava, valerian  tramadol  tryptophan This list may not describe all possible interactions. Give your health care provider a list of all the medicines,   herbs, non-prescription drugs, or dietary supplements you use. Also tell them if you smoke, drink alcohol, or use illegal  drugs. Some items may interact with your medicine. What should I watch for while using this medicine? Tell your doctor if your symptoms do not get better or if they get worse. Visit your doctor or health care professional for regular checks on your progress. Because it may take several weeks to see the full effects of this medicine, it is important to continue your treatment as prescribed by your doctor. Patients and their families should watch out for new or worsening thoughts of suicide or depression. Also watch out for sudden changes in feelings such as feeling anxious, agitated, panicky, irritable, hostile, aggressive, impulsive, severely restless, overly excited and hyperactive, or not being able to sleep. If this happens, especially at the beginning of treatment or after a change in dose, call your health care professional. This medicine can cause an increase in blood pressure. Check with your doctor for instructions on monitoring your blood pressure while taking this medicine. You may get drowsy or dizzy. Do not drive, use machinery, or do anything that needs mental alertness until you know how this medicine affects you. Do not stand or sit up quickly, especially if you are an older patient. This reduces the risk of dizzy or fainting spells. Alcohol may interfere with the effect of this medicine. Avoid alcoholic drinks. Your mouth may get dry. Chewing sugarless gum, sucking hard candy and drinking plenty of water will help. Contact your doctor if the problem does not go away or is severe. What side effects may I notice from receiving this medicine? Side effects that you should report to your doctor or health care professional as soon as possible:  allergic reactions like skin rash, itching or hives, swelling of the face, lips, or tongue  anxious  breathing problems  confusion  changes in vision  chest pain  confusion  elevated mood, decreased need for sleep, racing thoughts, impulsive  behavior  eye pain  fast, irregular heartbeat  feeling faint or lightheaded, falls  feeling agitated, angry, or irritable  hallucination, loss of contact with reality  high blood pressure  loss of balance or coordination  palpitations  redness, blistering, peeling or loosening of the skin, including inside the mouth  restlessness, pacing, inability to keep still  seizures  stiff muscles  suicidal thoughts or other mood changes  trouble passing urine or change in the amount of urine  trouble sleeping  unusual bleeding or bruising  unusually weak or tired  vomiting Side effects that usually do not require medical attention (report to your doctor or health care professional if they continue or are bothersome):  change in sex drive or performance  change in appetite or weight  constipation  dizziness  dry mouth  headache  increased sweating  nausea  tired This list may not describe all possible side effects. Call your doctor for medical advice about side effects. You may report side effects to FDA at 1-800-FDA-1088. Where should I keep my medicine? Keep out of the reach of children. Store at a controlled temperature between 20 and 25 degrees C (68 degrees and 77 degrees F), in a dry place. Throw away any unused medicine after the expiration date. NOTE: This sheet is a summary. It may not cover all possible information. If you have questions about this medicine, talk to your doctor, pharmacist, or health care provider.  2020 [employer]/Gold Standard (2018-10-10 12:06:43) Propranolol Tablets What   is this medicine? PROPRANOLOL (proe PRAN oh lole) is a beta blocker. It decreases the amount of work your heart has to do and helps your heart beat regularly. It treats high blood pressure and/or prevent chest pain (also called angina). It is also used after a heart attack to prevent a second one. This medicine may be used for other purposes; ask your health care  provider or pharmacist if you have questions. COMMON BRAND NAME(S): Inderal What should I tell my health care provider before I take this medicine? They need to know if you have any of these conditions:  circulation problems or blood vessel disease  diabetes  history of heart attack or heart disease, vasospastic angina  kidney disease  liver disease  lung or breathing disease, like asthma or emphysema  pheochromocytoma  slow heart rate  thyroid disease  an unusual or allergic reaction to propranolol, other beta-blockers, medicines, foods, dyes, or preservatives  pregnant or trying to get pregnant  breast-feeding How should I use this medicine? Take this drug by mouth. Take it as directed on the prescription label at the same time every day. Keep taking it unless your health care provider tells you to stop. Talk to your health care provider about the use of this drug in children. Special care may be needed. Overdosage: If you think you have taken too much of this medicine contact a poison control center or emergency room at once. NOTE: This medicine is only for you. Do not share this medicine with others. What if I miss a dose? If you miss a dose, take it as soon as you can. If it is almost time for your next dose, take only that dose. Do not take double or extra doses. What may interact with this medicine? Do not take this medicine with any of the following medications:  feverfew  phenothiazines like chlorpromazine, mesoridazine, prochlorperazine, thioridazine This medicine may also interact with the following medications:  aluminum hydroxide gel  antipyrine  antiviral medicines for HIV or AIDS  barbiturates like phenobarbital  certain medicines for blood pressure, heart disease, irregular heart beat  cimetidine  ciprofloxacin  diazepam  fluconazole  haloperidol  isoniazid  medicines for cholesterol like cholestyramine or colestipol  medicines for  mental depression  medicines for migraine headache like almotriptan, eletriptan, frovatriptan, naratriptan, rizatriptan, sumatriptan, zolmitriptan  NSAIDs, medicines for pain and inflammation, like ibuprofen or naproxen  phenytoin  rifampin  teniposide  theophylline  thyroid medicines  tolbutamide  warfarin  zileuton This list may not describe all possible interactions. Give your health care provider a list of all the medicines, herbs, non-prescription drugs, or dietary supplements you use. Also tell them if you smoke, drink alcohol, or use illegal drugs. Some items may interact with your medicine. What should I watch for while using this medicine? Visit your doctor or health care professional for regular check ups. Check your blood pressure and pulse rate regularly. Ask your health care professional what your blood pressure and pulse rate should be, and when you should contact them. You may get drowsy or dizzy. Do not drive, use machinery, or do anything that needs mental alertness until you know how this drug affects you. Do not stand or sit up quickly, especially if you are an older patient. This reduces the risk of dizzy or fainting spells. Alcohol can make you more drowsy and dizzy. Avoid alcoholic drinks. This medicine may increase blood sugar. Ask your healthcare provider if changes in diet or medicines are   needed if you have diabetes. Do not treat yourself for coughs, colds, or pain while you are taking this medicine without asking your doctor or health care professional for advice. Some ingredients may increase your blood pressure. What side effects may I notice from receiving this medicine? Side effects that you should report to your doctor or health care professional as soon as possible:  allergic reactions like skin rash, itching or hives, swelling of the face, lips, or tongue  breathing problems  cold hands or feet  difficulty sleeping, nightmares  dry peeling  skin  hallucinations  muscle cramps or weakness   signs and symptoms of high blood sugar such as being more thirsty or hungry or having to urinate more than normal. You may also feel very tired or have blurry vision.  slow heart rate  swelling of the legs and ankles  vomiting Side effects that usually do not require medical attention (report to your doctor or health care professional if they continue or are bothersome):  change in sex drive or performance  diarrhea  dry sore eyes  hair loss  nausea  weak or tired This list may not describe all possible side effects. Call your doctor for medical advice about side effects. You may report side effects to FDA at 1-800-FDA-1088. Where should I keep my medicine? Keep out of the reach of children and pets. Store at room temperature between 20 and 25 degrees C (68 and 77 degrees F). Protect from light. Throw away any unused drug after the expiration date. NOTE: This sheet is a summary. It may not cover all possible information. If you have questions about this medicine, talk to your doctor, pharmacist, or health care provider.  2020 [employer]vier/Gold Standard (2019-05-25 19:25:51)  

## 2020-08-04 ENCOUNTER — Encounter: Payer: Self-pay | Admitting: Obstetrics and Gynecology

## 2020-08-04 ENCOUNTER — Telehealth: Payer: Self-pay | Admitting: *Deleted

## 2020-08-04 NOTE — Telephone Encounter (Signed)
See 08/04/20 telephone encounter.  °Encounter closed.  °

## 2020-08-04 NOTE — Telephone Encounter (Signed)
Spoke with patient. Patient denies pelvic pain, fever/chills, irregular menses. States she sees PCP for AEX w/ pap. Reports ongoing white vaginal d/c with odor. Advised OV needed for further evaluation, patient agreeable.   OV scheduled for 08/26/20 at 3pm with Dr. Edward Jolly. Patient declined earlier appts offered. Patient is aware to return call to office if new symptoms develop or if symptoms worsen.    Routing to provider for final review. Patient is agreeable to disposition. Will close encounter.

## 2020-08-04 NOTE — Telephone Encounter (Signed)
Remas, Sobel R  P [facility] Good Morning!  The last time I saw you I explained a vaginal smell issue I had been having and you tested me for BV which was negative. I am still having this issue almost a year later and have tried everything I can think of or read on. I've used ph balance vaginal suppositories, I've cleaned myself mid day and changed my underwear to attempt to keep myself dry, I've used powder to attempt to keep dry, I've only worn cotton loose panties and wear loose fitting clothing. It is worse 2-3 days after sex. This has become a horrible thing for me and I'm hoping you can help. It is keeping me from wanting to or doing things that I love because I am self concious about it. I remember when I had this issue before I took Metronidazole and it helped for a while. Please let me know what we can do.  Thank you!

## 2020-08-12 ENCOUNTER — Encounter: Payer: Self-pay | Admitting: Nurse Practitioner

## 2020-08-20 ENCOUNTER — Other Ambulatory Visit: Payer: Self-pay | Admitting: Internal Medicine

## 2020-08-21 ENCOUNTER — Ambulatory Visit: Payer: No Typology Code available for payment source | Admitting: Nurse Practitioner

## 2020-08-25 ENCOUNTER — Other Ambulatory Visit: Payer: Self-pay

## 2020-08-25 ENCOUNTER — Telehealth (INDEPENDENT_AMBULATORY_CARE_PROVIDER_SITE_OTHER): Payer: No Typology Code available for payment source | Admitting: Psychiatry

## 2020-08-25 ENCOUNTER — Encounter: Payer: Self-pay | Admitting: Psychiatry

## 2020-08-25 DIAGNOSIS — F3342 Major depressive disorder, recurrent, in full remission: Secondary | ICD-10-CM

## 2020-08-25 DIAGNOSIS — F419 Anxiety disorder, unspecified: Secondary | ICD-10-CM | POA: Diagnosis not present

## 2020-08-25 DIAGNOSIS — F909 Attention-deficit hyperactivity disorder, unspecified type: Secondary | ICD-10-CM | POA: Diagnosis not present

## 2020-08-25 MED ORDER — CLONAZEPAM 0.5 MG PO TABS: 0.5000 mg | ORAL_TABLET | ORAL | 1 refills | Status: DC

## 2020-08-25 NOTE — Progress Notes (Signed)
Virtual Visit via Video Note  I connected with Michele Houston on 08/25/20 at  3:30 PM EDT by a video enabled telemedicine application and verified that I am speaking with the correct person using two identifiers.  Location Provider Location : [facility] Patient Location : Work  Participants: Patient , Provider    I discussed the limitations of evaluation and management by telemedicine and the availability of in person appointments. The patient expressed understanding and agreed to proceed.    I discussed the assessment and treatment plan with the patient. The patient was provided an opportunity to ask questions and all were answered. The patient agreed with the plan and demonstrated an understanding of the instructions.   The patient was advised to call back or seek an in-person evaluation if the symptoms worsen or if the condition fails to improve as anticipated.   BH MD OP Progress Note  08/25/2020 3:54 PM Michele Houston  MRN:  [id number]  Chief Complaint:  Chief Complaint    Follow-up     HPI: Michele Houston is a 34 year old Caucasian female, employed, lives in [place], has a history of MDD in remission, anxiety disorder unspecified, ADHD, gastroesophageal reflux disease was evaluated by telemedicine today.  Patient today reports she is tolerating the venlafaxine well. She denies side effects. She reports that has definitely helped her with her mood. She was also able to get off of the Wellbutrin and denies any withdrawal symptoms.  She reports she however did have a panic attack/anxiety attack a few weeks ago. She reports it was her daughter's birthday that day and they had a drive-through parade arranged. She reports that she tried taking the propranolol however that did not help at all.  Patient reports sleep is good.  She denies any suicidality, homicidality or perceptual disturbances.  She is compliant on her Vyvanse and that helps with her ADHD symptoms. She continues to  follow-up with [place] attention specialist.  Patient has not been very regular with her psychotherapy visits however reports she will schedule an appointment with her therapist to continue the same.  She denies any other concerns today.  Visit Diagnosis:    ICD-10-CM   1. Anxiety disorder, unspecified type  F41.9 clonazePAM (KLONOPIN) 0.5 MG tablet  2. Attention deficit hyperactivity disorder (ADHD), unspecified ADHD type  F90.9   3. MDD (major depressive disorder), recurrent, in full remission (HCC)  F33.42     Past Psychiatric History: I have reviewed past psychiatric history from my progress note on 07/16/2020. Past trials of Wellbutrin, Lexapro-suicidality, Zoloft, Celexa, Prozac--headaches  Past Medical History:  Past Medical History:  Diagnosis Date  . Anemia    when pregnant only  . GERD (gastroesophageal reflux disease)     Past Surgical History:  Procedure Laterality Date  . CHOLECYSTECTOMY N/A 06/03/2015   Procedure: LAPAROSCOPIC CHOLECYSTECTOMY WITH INTRAOPERATIVE CHOLANGIOGRAM;  Surgeon: Earline Mayotte, MD;  Location: [facility] ORS;  Service: General;  Laterality: N/A;  . KNEE SURGERY Left 2011   arthroscopy  . tubes in ears  baby    Family Psychiatric History: I have reviewed family psychiatric history from my progress note on 07/16/2020  Family History:  Family History  Problem Relation Age of Onset  . Hyperthyroidism Mother   . Depression Mother   . Lung cancer Father   . Depression Father   . Drug abuse Father   . Lung cancer Paternal Uncle   . Lung cancer Paternal Grandmother   . Cervical cancer Sister   .  Depression Sister   . Hypertension Brother   . Bipolar disorder Sister     Social History: Reviewed social history from my progress note on 07/16/2020 Social History   Socioeconomic History  . Marital status: Married    Spouse name: Not on file  . Number of children: 3  . Years of education: Not on file  . Highest education level: Not on file   Occupational History  . Occupation: Chief Executive Officer:   Tobacco Use  . Smoking status: Former Smoker    Packs/day: 1.00    Years: 4.00    Pack years: 4.00    Types: Cigarettes    Quit date: 05/30/2011    Years since quitting: 9.2  . Smokeless tobacco: Never Used  Vaping Use  . Vaping Use: Never used  Substance and Sexual Activity  . Alcohol use: Not Currently    Alcohol/week: 0.0 standard drinks  . Drug use: Never  . Sexual activity: Yes    Birth control/protection: None    Comment: Vasectomy in husband  Other Topics Concern  . Not on file  Social History Narrative  . Not on file   Social Determinants of Health   Financial Resource Strain:   . Difficulty of Paying Living Expenses: Not on file  Food Insecurity:   . Worried About Programme researcher, broadcasting/film/video in the Last Year: Not on file  . Ran Out of Food in the Last Year: Not on file  Transportation Needs:   . Lack of Transportation (Medical): Not on file  . Lack of Transportation (Non-Medical): Not on file  Physical Activity:   . Days of Exercise per Week: Not on file  . Minutes of Exercise per Session: Not on file  Stress:   . Feeling of Stress : Not on file  Social Connections:   . Frequency of Communication with Friends and Family: Not on file  . Frequency of Social Gatherings with Friends and Family: Not on file  . Attends Religious Services: Not on file  . Active Member of Clubs or Organizations: Not on file  . Attends Banker Meetings: Not on file  . Marital Status: Not on file    Allergies:  Allergies  Allergen Reactions  . Ceclor [Cefaclor] Hives  . Septra [Sulfamethoxazole-Trimethoprim] Nausea And Vomiting  . Tri-Sprintec [Norgestimate-Eth Estradiol] Other (See Comments)    Headaches, increased anxiety    Metabolic Disorder Labs: Lab Results  Component Value Date   HGBA1C 5.6 05/08/2020   No results found for: PROLACTIN Lab Results  Component Value Date   CHOL 181  05/08/2020   TRIG 64.0 05/08/2020   HDL 81.80 05/08/2020   CHOLHDL 2 05/08/2020   VLDL 12.8 05/08/2020   LDLCALC 86 05/08/2020   LDLCALC 100 (H) 03/29/2019   Lab Results  Component Value Date   TSH 0.92 05/08/2020   TSH 1.14 03/29/2019    Therapeutic Level Labs: No results found for: LITHIUM No results found for: VALPROATE No components found for:  CBMZ  Current Medications: Current Outpatient Medications  Medication Sig Dispense Refill  . cetirizine (ZYRTEC) 10 MG tablet Take 10 mg by mouth daily.    . clonazePAM (KLONOPIN) 0.5 MG tablet Take 1 tablet (0.5 mg total) by mouth as directed. Start taking 1 tablet as needed 1-2 times a week only for severe panic attacks 10 tablet 1  . ferrous sulfate 325 (65 FE) MG EC tablet Take 1 tablet (325 mg total) by mouth daily.  30 tablet 1  . meloxicam (MOBIC) 15 MG tablet meloxicam 15 mg tablet  Take 1 tablet every day by oral route. (Patient not taking: Reported on 07/16/2020)    . omeprazole (PRILOSEC) 20 MG capsule TAKE 1 CAPSULE (20 MG TOTAL) BY MOUTH DAILY. 90 capsule 0  . traMADol (ULTRAM) 50 MG tablet Take 50 mg by mouth every 6 (six) hours as needed.    . venlafaxine XR (EFFEXOR-XR) 37.5 MG 24 hr capsule Take 1 capsule (37.5 mg total) by mouth daily with breakfast. 30 capsule 1  . VYVANSE 20 MG capsule Take 20 mg by mouth at bedtime.     No current facility-administered medications for this visit.     Musculoskeletal: Strength & Muscle Tone: UTA Gait & Station: normal Patient leans: N/A  Psychiatric Specialty Exam: Review of Systems  Psychiatric/Behavioral: The patient is nervous/anxious.   All other systems reviewed and are negative.   There were no vitals taken for this visit.There is no height or weight on file to calculate BMI.  General Appearance: Casual  Eye Contact:  Fair  Speech:  Clear and Coherent  Volume:  Normal  Mood:  Anxious  Affect:  Congruent  Thought Process:  Goal Directed and Descriptions of  Associations: Intact  Orientation:  Full (Time, Place, and Person)  Thought Content: Logical   Suicidal Thoughts:  No  Homicidal Thoughts:  No  Memory:  Immediate;   Fair Recent;   Fair Remote;   Fair  Judgement:  Fair  Insight:  Fair  Psychomotor Activity:  Normal  Concentration:  Concentration: Fair and Attention Span: Fair  Recall:  Michele Houston of Knowledge: Fair  Language: Fair  Akathisia:  No  Handed:  Right  AIMS (if indicated): UTA  Assets:  Communication Skills Desire for Improvement Housing Social Support  ADL's:  Intact  Cognition: WNL  Sleep:  Fair   Screenings: GAD-7     Telemedicine from 07/16/2020 in [facility] Office Visit from 05/08/2020 in [facility] Office Visit from 02/07/2020 in [facility] Office Visit from 06/13/2019 in [facility] Visit from 03/29/2019 in [facility]  Total GAD-7 Score 6 10 16 18 7     PHQ2-9     Office Visit from 05/08/2020 in [facility] Office Visit from 02/07/2020 in [facility] Office Visit from 03/29/2019 in [facility] Visit from 12/06/2018 in [facility]  PHQ-2 Total Score 0 0 1 1  PHQ-9 Total Score 10 2 6 6        Assessment and Plan: Michele Houston is a 34 year old Caucasian female, employed, lives in [place], has a history of anxiety, depression, ADHD was evaluated by telemedicine today. Patient is biologically predisposed given her family history, history of trauma. Patient with psychosocial stressors of multiple deaths in the family, pandemic, husband recently being positive for COVID-19. Patient is currently making progress on the current medication regimen however continues to struggle with anxiety and will benefit from further medication management and continued CBT. Plan as noted below.  Plan Anxiety disorder  unspecified-unstable Continue Effexor extended release 37.5 mg p.o. daily with breakfast Discontinue propranolol for lack of benefit. Start Klonopin 0.5 mg as needed for severe anxiety attacks only. Provided education about benzodiazepine therapy, advised patient to limit use. I have reviewed [place] controlled substance database.  ADHD-improving Vyvanse 20 mg p.o. daily She is currently under the care of  [facility] attention specialist.  Advised patient to continue CBT. Her therapist is Ms. Jeannetta EllisKathy Clayton.  Follow-up in clinic in 1 month or sooner if needed.  I have spent atleast 20 minutes face to face by video with patient today. More than 50 % of the time was spent for preparing to see the patient ( e.g., review of test, records ),  ordering medications and test ,psychoeducation and supportive psychotherapy and care coordination,as well as documenting clinical information in electronic health record. This note was generated in part or whole with voice recognition software. Voice recognition is usually quite accurate but there are transcription errors that can and very often do occur. I apologize for any typographical errors that were not detected and corrected.      Jomarie LongsSaramma Kassey Laforest, MD 08/26/2020, 8:28 AM

## 2020-08-25 NOTE — Progress Notes (Signed)
GYNECOLOGY  VISIT   HPI: 34 y.o.   Married  Caucasian  female   [id number] with Patient's last menstrual period was 08/21/2020 (exact date).   here for white vaginal discharge with odor for months to a year.   Removed her IUD in December, 2020.   Has persistent odor.   Tried vaginal suppositories for pH balance.  When she stopped, the odor would return.  Probiotics work well also, but when she stops or starts to sweat, the odor returns.   No itching, burning, or irritation.  Color of discharge can change.   The odor is disruptive to her sexual life.  She does have post coital bleeding.   Taking a shower does not remove the odor.   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  GYNECOLOGIC HISTORY: Patient's last menstrual period was 08/21/2020 (exact date). Contraception:  Vasectomy/condoms Menopausal hormone therapy:  n/a Last mammogram:  n/a Last pap smear: 03/29/19 Neg:Neg HR HPV.  All normal paps.         OB History    Gravida  3   Para  3   Term  0   Preterm  0   AB  0   Living  3     SAB  0   TAB  0   Ectopic  0   Multiple  0   Live Births  0        Obstetric Comments  1st Menstrual Cycle: 13 1st Pregnancy:  20            Patient Active Problem List   Diagnosis Date Noted  . GAD (generalized anxiety disorder) 07/16/2020  . Attention deficit hyperactivity disorder (ADHD) 07/16/2020  . MDD (major depressive disorder), recurrent, in full remission (HCC) 07/16/2020  . BMI 29.0-29.9,adult 05/08/2020  . Heart palpitations 05/08/2020  . Need for hepatitis C screening test 05/08/2020  . Migraine 12/29/2018  . IUD (intrauterine device) in place 12/06/2018  . Anxiety and depression 12/06/2018  . Anxiety 12/06/2018    Past Medical History:  Diagnosis Date  . Anemia    when pregnant only  . GERD (gastroesophageal reflux disease)     Past Surgical History:  Procedure Laterality Date  . CHOLECYSTECTOMY N/A 06/03/2015    Procedure: LAPAROSCOPIC CHOLECYSTECTOMY WITH INTRAOPERATIVE CHOLANGIOGRAM;  Surgeon: Earline Mayotte, MD;  Location: [facility] ORS;  Service: General;  Laterality: N/A;  . KNEE SURGERY Left 2011   arthroscopy  . tubes in ears  baby    Current Outpatient Medications  Medication Sig Dispense Refill  . cetirizine (ZYRTEC) 10 MG tablet Take 10 mg by mouth daily.    . clonazePAM (KLONOPIN) 0.5 MG tablet Take 1 tablet (0.5 mg total) by mouth as directed. Start taking 1 tablet as needed 1-2 times a week only for severe panic attacks 10 tablet 1  . ferrous sulfate 325 (65 FE) MG EC tablet Take 1 tablet (325 mg total) by mouth daily. 30 tablet 1  . omeprazole (PRILOSEC) 20 MG capsule TAKE 1 CAPSULE (20 MG TOTAL) BY MOUTH DAILY. 90 capsule 0  . venlafaxine XR (EFFEXOR-XR) 37.5 MG 24 hr capsule Take 1 capsule (37.5 mg total) by mouth daily with breakfast. 30 capsule 1  . VYVANSE 20 MG capsule Take 20 mg by mouth at bedtime.     No current facility-administered medications for this visit.     ALLERGIES: Ceclor [cefaclor], Septra [sulfamethoxazole-trimethoprim], and Tri-sprintec [norgestimate-eth estradiol]  Family History  Problem Relation Age  of Onset  . Hyperthyroidism Mother   . Depression Mother   . Lung cancer Father   . Depression Father   . Drug abuse Father   . Lung cancer Paternal Uncle   . Lung cancer Paternal Grandmother   . Cervical cancer Sister   . Depression Sister   . Hypertension Brother   . Bipolar disorder Sister     Social History   Socioeconomic History  . Marital status: Married    Spouse name: Not on file  . Number of children: 3  . Years of education: Not on file  . Highest education level: Not on file  Occupational History  . Occupation: Chief Executive Officer: [employer]  Tobacco Use  . Smoking status: Former Smoker    Packs/day: 1.00    Years: 4.00    Pack years: 4.00    Types: Cigarettes    Quit date: 05/30/2011    Years since quitting: 9.2  .  Smokeless tobacco: Never Used  Vaping Use  . Vaping Use: Never used  Substance and Sexual Activity  . Alcohol use: Not Currently    Alcohol/week: 0.0 standard drinks  . Drug use: Never  . Sexual activity: Yes    Birth control/protection: None    Comment: Vasectomy in husband  Other Topics Concern  . Not on file  Social History Narrative  . Not on file   Social Determinants of Health   Financial Resource Strain:   . Difficulty of Paying Living Expenses: Not on file  Food Insecurity:   . Worried About Programme researcher, broadcasting/film/video in the Last Year: Not on file  . Ran Out of Food in the Last Year: Not on file  Transportation Needs:   . Lack of Transportation (Medical): Not on file  . Lack of Transportation (Non-Medical): Not on file  Physical Activity:   . Days of Exercise per Week: Not on file  . Minutes of Exercise per Session: Not on file  Stress:   . Feeling of Stress : Not on file  Social Connections:   . Frequency of Communication with Friends and Family: Not on file  . Frequency of Social Gatherings with Friends and Family: Not on file  . Attends Religious Services: Not on file  . Active Member of Clubs or Organizations: Not on file  . Attends Banker Meetings: Not on file  . Marital Status: Not on file  Intimate Partner Violence:   . Fear of Current or Ex-Partner: Not on file  . Emotionally Abused: Not on file  . Physically Abused: Not on file  . Sexually Abused: Not on file    Review of Systems  All other systems reviewed and are negative.   PHYSICAL EXAMINATION:    BP 110/68 (Cuff Size: Large)   Pulse 68   Ht 5\' 7"  (1.702 m)   Wt 178 lb (80.7 kg)   LMP 08/21/2020 (Exact Date)   SpO2 99%   BMI 27.88 kg/m     General appearance: alert, cooperative and appears stated age  Pelvic: External genitalia:  no lesions              Urethra:  normal appearing urethra with no masses, tenderness or lesions              Bartholins and Skenes: normal                  Vagina: normal appearing vagina with normal color and discharge, no  lesions              Cervix: no lesions                Bimanual Exam:  Uterus:  normal size, contour, position, consistency, mobility, non-tender              Adnexa: no mass, fullness, tenderness          Chaperone was present for exam.  ASSESSMENT  Vaginal odor, chronic.  Distressing for the patient.  STD screening.   PLAN  We discussed reasons for vaginal odor, including perspiration.  Will check for BV, yeast, trichomonas, GC/CT. If testing is negative for BV, ok for Flagyl x 1 course. May benefit from Cleocin lotion or Hibiclens for washing skin? Avoid placing products in the vagina.  Ok to continue probiotics.    25 minutes total time was spent for this patient encounter, including preparation, face-to-face counseling with the patient, coordination of care, and documentation of the encounter.

## 2020-08-25 NOTE — Patient Instructions (Signed)
Clonazepam tablets What is this medicine? CLONAZEPAM (kloe NA ze pam) is a benzodiazepine. It is used to treat certain types of seizures. It is also used to treat panic disorder. This medicine may be used for other purposes; ask your health care provider or pharmacist if you have questions. COMMON BRAND NAME(S): Ceberclon, Klonopin What should I tell my health care provider before I take this medicine? They need to know if you have any of these conditions:  an alcohol or drug abuse problem  bipolar disorder, depression, psychosis or other mental health condition  glaucoma  kidney or liver disease  lung or breathing disease  myasthenia gravis  Parkinson's disease  porphyria  seizures or a history of seizures  suicidal thoughts  an unusual or allergic reaction to clonazepam, other benzodiazepines, foods, dyes, or preservatives  pregnant or trying to get pregnant  breast-feeding How should I use this medicine? Take this medicine by mouth with a glass of water. Follow the directions on the prescription label. If it upsets your stomach, take it with food or milk. Take your medicine at regular intervals. Do not take it more often than directed. Do not stop taking or change the dose except on the advice of your doctor or health care professional. A special MedGuide will be given to you by the pharmacist with each prescription and refill. Be sure to read this information carefully each time. Talk to your pediatrician regarding the use of this medicine in children. Special care may be needed. Overdosage: If you think you have taken too much of this medicine contact a poison control center or emergency room at once. NOTE: This medicine is only for you. Do not share this medicine with others. What if I miss a dose? If you miss a dose, take it as soon as you can. If it is almost time for your next dose, take only that dose. Do not take double or extra doses. What may interact with this  medicine? Do not take this medication with any of the following medicines:  narcotic medicines for cough  sodium oxybate This medicine may also interact with the following medications:  alcohol  antihistamines for allergy, cough and cold  antiviral medicines for HIV or AIDS  certain medicines for anxiety or sleep  certain medicines for depression, like amitriptyline, fluoxetine, sertraline  certain medicines for fungal infections like ketoconazole and itraconazole  certain medicines for seizures like carbamazepine, phenobarbital, phenytoin, primidone  general anesthetics like halothane, isoflurane, methoxyflurane, propofol  local anesthetics like lidocaine, pramoxine, tetracaine  medicines that relax muscles for surgery  narcotic medicines for pain  phenothiazines like chlorpromazine, mesoridazine, prochlorperazine, thioridazine This list may not describe all possible interactions. Give your health care provider a list of all the medicines, herbs, non-prescription drugs, or dietary supplements you use. Also tell them if you smoke, drink alcohol, or use illegal drugs. Some items may interact with your medicine. What should I watch for while using this medicine? Tell your doctor or health care professional if your symptoms do not start to get better or if they get worse. Do not stop taking except on your doctor's advice. You may develop a severe reaction. Your doctor will tell you how much medicine to take. You may get drowsy or dizzy. Do not drive, use machinery, or do anything that needs mental alertness until you know how this medicine affects you. To reduce the risk of dizzy and fainting spells, do not stand or sit up quickly, especially if you are   an older patient. Alcohol may increase dizziness and drowsiness. Avoid alcoholic drinks. If you are taking another medicine that also causes drowsiness, you may have more side effects. Give your health care provider a list of all  medicines you use. Your doctor will tell you how much medicine to take. Do not take more medicine than directed. Call emergency for help if you have problems breathing or unusual sleepiness. The use of this medicine may increase the chance of suicidal thoughts or actions. Pay special attention to how you are responding while on this medicine. Any worsening of mood, or thoughts of suicide or dying should be reported to your health care professional right away. What side effects may I notice from receiving this medicine? Side effects that you should report to your doctor or health care professional as soon as possible:  allergic reactions like skin rash, itching or hives, swelling of the face, lips, or tongue  breathing problems  confusion  loss of balance or coordination  signs and symptoms of low blood pressure like dizziness; feeling faint or lightheaded, falls; unusually weak or tired  suicidal thoughts or mood changes Side effects that usually do not require medical attention (report to your doctor or health care professional if they continue or are bothersome):  dizziness  headache  tiredness  upset stomach This list may not describe all possible side effects. Call your doctor for medical advice about side effects. You may report side effects to FDA at 1-800-FDA-1088. Where should I keep my medicine? Keep out of the reach of children. This medicine can be abused. Keep your medicine in a safe place to protect it from theft. Do not share this medicine with anyone. Selling or giving away this medicine is dangerous and against the law. This medicine may cause accidental overdose and death if taken by other adults, children, or pets. Mix any unused medicine with a substance like cat litter or coffee grounds. Then throw the medicine away in a sealed container like a sealed bag or a coffee can with a lid. Do not use the medicine after the expiration date. Store at room temperature between  15 and 30 degrees C (59 and 86 degrees F). Protect from light. Keep container tightly closed. NOTE: This sheet is a summary. It may not cover all possible information. If you have questions about this medicine, talk to your doctor, pharmacist, or health care provider.  2020 Elsevier/Gold Standard (2016-03-26 18:46:32)  

## 2020-08-26 ENCOUNTER — Other Ambulatory Visit (HOSPITAL_COMMUNITY)
Admission: RE | Admit: 2020-08-26 | Discharge: 2020-08-26 | Disposition: A | Discharge status: Home / Self Care | Payer: No Typology Code available for payment source | Source: Ambulatory Visit | Attending: Obstetrics and Gynecology | Admitting: Obstetrics and Gynecology

## 2020-08-26 ENCOUNTER — Encounter: Payer: Self-pay | Admitting: Obstetrics and Gynecology

## 2020-08-26 ENCOUNTER — Other Ambulatory Visit: Payer: Self-pay

## 2020-08-26 ENCOUNTER — Ambulatory Visit (INDEPENDENT_AMBULATORY_CARE_PROVIDER_SITE_OTHER): Payer: No Typology Code available for payment source | Admitting: Obstetrics and Gynecology

## 2020-08-26 VITALS — BP 110/68 | HR 68 | Ht 67.0 in | Wt 178.0 lb

## 2020-08-26 DIAGNOSIS — N898 Other specified noninflammatory disorders of vagina: Secondary | ICD-10-CM | POA: Diagnosis not present

## 2020-08-26 DIAGNOSIS — Z113 Encounter for screening for infections with a predominantly sexual mode of transmission: Secondary | ICD-10-CM | POA: Insufficient documentation

## 2020-08-26 DIAGNOSIS — N760 Acute vaginitis: Secondary | ICD-10-CM | POA: Insufficient documentation

## 2020-08-27 LAB — CERVICOVAGINAL ANCILLARY ONLY
Bacterial Vaginitis (gardnerella): NEGATIVE
Candida Glabrata: NEGATIVE
Candida Vaginitis: NEGATIVE
Chlamydia: NEGATIVE
Comment: NEGATIVE
Comment: NEGATIVE
Comment: NEGATIVE
Comment: NEGATIVE
Comment: NEGATIVE
Comment: NORMAL
Neisseria Gonorrhea: NEGATIVE
Trichomonas: NEGATIVE

## 2020-08-27 MED FILL — VENLAFAXINE HCL ER 37.5 MG: 37.5 | 30 days supply | Qty: 30 | Fill #0

## 2020-09-04 ENCOUNTER — Other Ambulatory Visit: Payer: Self-pay | Admitting: Obstetrics and Gynecology

## 2020-09-04 ENCOUNTER — Telehealth: Payer: Self-pay

## 2020-09-04 ENCOUNTER — Encounter: Payer: Self-pay | Admitting: Obstetrics and Gynecology

## 2020-09-04 MED ORDER — METRONIDAZOLE 500 MG PO TABS: 500.0000 mg | ORAL_TABLET | Freq: Two times a day (BID) | ORAL | 0 refills | Status: DC

## 2020-09-04 NOTE — Telephone Encounter (Signed)
Spoke with pt. Pt given results and recommendations per Dr Edward Jolly. Pt agreeable to still take 1 time Rx of Flagyl for vaginal odor. Pt verbalized understanding on instructions and ETOH precautions. Rx Flagyl sent to pharmacy on file. # 14, 0RF  Routing to Dr Edward Jolly for update  Encounter closed.

## 2020-09-04 NOTE — Telephone Encounter (Signed)
-----   Message from Patton Salles, MD sent at 09/02/2020  2:21 PM EDT ----- Please contact patient with results of her testing which is negative for bacterial vaginosis, yeast, trichomonas, gonorrhea and chlamydia.   I did offer her an empiric one time treatment with Flagyl 500 mg po bid x 7 days if her testing were negative for BV.  Please see if she would like to do this to see if it helps with the vaginal odor.  ETOH precautions.

## 2020-09-12 ENCOUNTER — Other Ambulatory Visit: Payer: Self-pay

## 2020-09-12 ENCOUNTER — Encounter: Payer: Self-pay | Admitting: Nurse Practitioner

## 2020-09-12 ENCOUNTER — Telehealth (INDEPENDENT_AMBULATORY_CARE_PROVIDER_SITE_OTHER): Payer: No Typology Code available for payment source | Admitting: Nurse Practitioner

## 2020-09-12 VITALS — Ht 67.0 in | Wt 173.0 lb

## 2020-09-12 DIAGNOSIS — J069 Acute upper respiratory infection, unspecified: Secondary | ICD-10-CM | POA: Insufficient documentation

## 2020-09-12 DIAGNOSIS — J019 Acute sinusitis, unspecified: Secondary | ICD-10-CM | POA: Diagnosis not present

## 2020-09-12 MED ORDER — SACCHAROMYCES BOULARDII 250 MG PO CAPS: 250.0000 mg | ORAL_CAPSULE | Freq: Two times a day (BID) | ORAL | 0 refills | Status: AC

## 2020-09-12 MED ORDER — DEXTROMETHORPHAN-GUAIFENESIN 5-100 MG/5ML PO LIQD: 10.0000 mL | Freq: Every evening | ORAL | 0 refills | Status: AC | PRN

## 2020-09-12 MED ORDER — DOXYCYCLINE HYCLATE 100 MG PO TABS: 100.0000 mg | ORAL_TABLET | Freq: Two times a day (BID) | ORAL | 0 refills | Status: DC

## 2020-09-12 NOTE — Patient Instructions (Addendum)
Acute nonrecurrent sinusitis.  Advised to rest, drink fluids, use Tylenol or ibuprofen as needed for pain.    Please use normal saline spray such as simply saline to help rinse, decongest the nasal passages.  Since Flonase gives you a headache, do not use that.  I have called in 3 prescriptions for you:  1.,  Doxycycline 100 mg 1 pill twice daily for 7 days.  Take this with a large glass of water and  food.   2.  A probiotic called Florastor, to help put good bacteria in the colon to avoid diarrhea.  If this is too expensive, you may purchase over-the-counter probiotic.  Also, eat yogurt such as Activia.  3.  For cough Mucinex DM take as directed at bedtime.    If no improvement in 3 days please call the office.  If symptoms worsen please seek care at an acute care clinic such as [facility] or [facility].  Contact a health care provider if:  You have a fever.  Your symptoms get worse.  Your symptoms do not improve within 10 days. Get help right away if:  You have a severe headache.  You have persistent vomiting.  You have severe pain or swelling around your face or eyes.  You have vision problems.  You develop confusion.  Your neck is stiff.  You have trouble breathing  Sinusitis, Adult Sinusitis is inflammation of your sinuses. Sinuses are hollow spaces in the bones around your face. Your sinuses are located:  Around your eyes.  In the middle of your forehead.  Behind your nose.  In your cheekbones. Mucus normally drains out of your sinuses. When your nasal tissues become inflamed or swollen, mucus can become trapped or blocked. This allows bacteria, viruses, and fungi to grow, which leads to infection. Most infections of the sinuses are caused by a virus. Sinusitis can develop quickly. It can last for up to 4 weeks (acute) or for more than 12 weeks (chronic). Sinusitis often develops after a cold. What are the causes? This condition is caused by  anything that creates swelling in the sinuses or stops mucus from draining. This includes:  Allergies.  Asthma.  Infection from bacteria or viruses.  Deformities or blockages in your nose or sinuses.  Abnormal growths in the nose (nasal polyps).  Pollutants, such as chemicals or irritants in the air.  Infection from fungi (rare). What increases the risk? You are more likely to develop this condition if you:  Have a weak body defense system (immune system).  Do a lot of swimming or diving.  Overuse nasal sprays.  Smoke. What are the signs or symptoms? The main symptoms of this condition are pain and a feeling of pressure around the affected sinuses. Other symptoms include:  Stuffy nose or congestion.  Thick drainage from your nose.  Swelling and warmth over the affected sinuses.  Headache.  Upper toothache.  A cough that may get worse at night.  Extra mucus that collects in the throat or the back of the nose (postnasal drip).  Decreased sense of smell and taste.  Fatigue.  A fever.  Sore throat.  Bad breath. How is this diagnosed? This condition is diagnosed based on:  Your symptoms.  Your medical history.  A physical exam.  Tests to find out if your condition is acute or chronic. This may include: ? Checking your nose for nasal polyps. ? Viewing your sinuses using a device that has a light (endoscope). ? Testing  for allergies or bacteria. ? Imaging tests, such as an MRI or CT scan. In rare cases, a bone biopsy may be done to rule out more serious types of fungal sinus disease. How is this treated? Treatment for sinusitis depends on the cause and whether your condition is chronic or acute.  If caused by a virus, your symptoms should go away on their own within 10 days. You may be given medicines to relieve symptoms. They include: ? Medicines that shrink swollen nasal passages (topical intranasal decongestants). ? Medicines that treat allergies  (antihistamines). ? A spray that eases inflammation of the nostrils (topical intranasal corticosteroids). ? Rinses that help get rid of thick mucus in your nose (nasal saline washes).  If caused by bacteria, your health care provider may recommend waiting to see if your symptoms improve. Most bacterial infections will get better without antibiotic medicine. You may be given antibiotics if you have: ? A severe infection. ? A weak immune system.  If caused by narrow nasal passages or nasal polyps, you may need to have surgery. Follow these instructions at home: Medicines  Take, use, or apply over-the-counter and prescription medicines only as told by your health care provider. These may include nasal sprays.  If you were prescribed an antibiotic medicine, take it as told by your health care provider. Do not stop taking the antibiotic even if you start to feel better. Hydrate and humidify   Drink enough fluid to keep your urine pale yellow. Staying hydrated will help to thin your mucus.  Use a cool mist humidifier to keep the humidity level in your home above 50%.  Inhale steam for 10-15 minutes, 3-4 times a day, or as told by your health care provider. You can do this in the bathroom while a hot shower is running.  Limit your exposure to cool or dry air. Rest  Rest as much as possible.  Sleep with your head raised (elevated).  Make sure you get enough sleep each night. General instructions   Apply a warm, moist washcloth to your face 3-4 times a day or as told by your health care provider. This will help with discomfort.  Wash your hands often with soap and water to reduce your exposure to germs. If soap and water are not available, use hand sanitizer.  Do not smoke. Avoid being around people who are smoking (secondhand smoke).  Keep all follow-up visits as told by your health care provider. This is important. Contact a health care provider if:  You have a fever.  Your  symptoms get worse.  Your symptoms do not improve within 10 days. Get help right away if:  You have a severe headache.  You have persistent vomiting.  You have severe pain or swelling around your face or eyes.  You have vision problems.  You develop confusion.  Your neck is stiff.  You have trouble breathing. Summary  Sinusitis is soreness and inflammation of your sinuses. Sinuses are hollow spaces in the bones around your face.  This condition is caused by nasal tissues that become inflamed or swollen. The swelling traps or blocks the flow of mucus. This allows bacteria, viruses, and fungi to grow, which leads to infection.  If you were prescribed an antibiotic medicine, take it as told by your health care provider. Do not stop taking the antibiotic even if you start to feel better.  Keep all follow-up visits as told by your health care provider. This is important. This information is not  intended to replace advice given to you by your health care provider. Make sure you discuss any questions you have with your health care provider. Document Revised: 03/20/2018 Document Reviewed: 03/20/2018 Elsevier Patient Education  2020 [employer].

## 2020-09-12 NOTE — Progress Notes (Signed)
Virtual Visit via Video Note  This visit type was conducted due to national recommendations for restrictions regarding the COVID-19 pandemic (e.g. social distancing).  This format is felt to be most appropriate for this patient at this time.  All issues noted in this document were discussed and addressed.  No physical exam was performed (except for noted visual exam findings with Video Visits).   I connected with@ on 09/12/20 at  4:30 PM EST by a video enabled telemedicine application or telephone and verified that I am speaking with the correct person using two identifiers. Location patient: home Location provider: work or home office Persons participating in the virtual visit: patient, provider  I discussed the limitations, risks, security and privacy concerns of performing an evaluation and management service by telephone and the availability of in person appointments. I also discussed with the patient that there may be a patient responsible charge related to this service. The patient expressed understanding and agreed to proceed.   Reason for visit: Cold symptoms for 1 week that is scanned into a sinus infection with left ear infection.  HPI: This 34 year old patient with history of migraine, anxiety depression, reports onset of cold symptoms that turned into sinus infection.  She gets this a couple times a year.  She had a Covid test that was negative yesterday.  She is having pain in her face, upper teeth, left ear, nasal congestion, cough that is nonproductive, sore throat.  She is blowing out clear to green nasal discharge.  Large amounts of nasal discharge.  She has noted no fevers or chills.  No GI complaints.  She has taken over-the-counter generic cold medicine, Tylenol Cold and flu, and Mucinex without relief. She does have normal saline nasal spray, and Flonase but that gives her headaches so she does not use the Flonase.  No ill contacts.  She had Pfizer Covid vaccine in July and August  13.  She has had the flu vaccine this year.  She presents on metronidazole for bacterial vaginosis and has 2 more days left.  ROS: See pertinent positives and negatives per HPI.  Past Medical History:  Diagnosis Date  . Anemia    when pregnant only  . GERD (gastroesophageal reflux disease)     Past Surgical History:  Procedure Laterality Date  . CHOLECYSTECTOMY N/A 06/03/2015   Procedure: LAPAROSCOPIC CHOLECYSTECTOMY WITH INTRAOPERATIVE CHOLANGIOGRAM;  Surgeon: Earline Mayotte, MD;  Location: [facility] ORS;  Service: General;  Laterality: N/A;  . KNEE SURGERY Left 2011   arthroscopy  . tubes in ears  baby    Family History  Problem Relation Age of Onset  . Hyperthyroidism Mother   . Depression Mother   . Lung cancer Father   . Depression Father   . Drug abuse Father   . Lung cancer Paternal Uncle   . Lung cancer Paternal Grandmother   . Cervical cancer Sister   . Depression Sister   . Hypertension Brother   . Bipolar disorder Sister     SOCIAL HX: Former smoker quit in 2012   Current Outpatient Medications:  .  cetirizine (ZYRTEC) 10 MG tablet, Take 10 mg by mouth daily., Disp: , Rfl:  .  clonazePAM (KLONOPIN) 0.5 MG tablet, Take 1 tablet (0.5 mg total) by mouth as directed. Start taking 1 tablet as needed 1-2 times a week only for severe panic attacks, Disp: 10 tablet, Rfl: 1 .  ferrous sulfate 325 (65 FE) MG EC tablet, Take 1 tablet (325 mg total) by  mouth daily., Disp: 30 tablet, Rfl: 1 .  metroNIDAZOLE (FLAGYL) 500 MG tablet, Take 1 tablet (500 mg total) by mouth 2 (two) times daily., Disp: 14 tablet, Rfl: 0 .  omeprazole (PRILOSEC) 20 MG capsule, TAKE 1 CAPSULE (20 MG TOTAL) BY MOUTH DAILY., Disp: 90 capsule, Rfl: 0 .  venlafaxine XR (EFFEXOR-XR) 37.5 MG 24 hr capsule, Take 1 capsule (37.5 mg total) by mouth daily with breakfast., Disp: 30 capsule, Rfl: 1 .  VYVANSE 20 MG capsule, Take 20 mg by mouth at bedtime., Disp: , Rfl:  .  Dextromethorphan-guaiFENesin 5-100 MG/5ML  LIQD, Take 10 mLs by mouth at bedtime as needed for up to 7 days (for cough)., Disp: 70 mL, Rfl: 0 .  doxycycline (VIBRA-TABS) 100 MG tablet, Take 1 tablet (100 mg total) by mouth 2 (two) times daily., Disp: 14 tablet, Rfl: 0 .  saccharomyces boulardii (FLORASTOR) 250 MG capsule, Take 1 capsule (250 mg total) by mouth 2 (two) times daily for 14 days., Disp: 28 capsule, Rfl: 0  EXAM:  VITALS per patient if applicable: Weight is 173 pounds  GENERAL: alert, oriented, appears well and in no acute distress  HEENT: atraumatic, conjunctiva clear, no obvious abnormalities on inspection of external nose and ears, Nasal congestion noted.   NECK: normal movements of the head and neck  LUNGS: on inspection no signs of respiratory distress, breathing rate appears normal, no obvious gross SOB, gasping or wheezing  CV: no obvious cyanosis  MS: moves all visible extremities without noticeable abnormality  PSYCH/NEURO: pleasant and cooperative, no obvious depression or anxiety, speech and thought processing grossly intact  ASSESSMENT AND PLAN:  Discussed the following assessment and plan:  Acute non-recurrent sinusitis, unspecified location Advised to rest, drink fluids, use Tylenol or ibuprofen as needed for pain.    Please use normal saline spray such as simply saline to help rinse, decongest the nasal passages.  Since Flonase gives you a headache, do not use that.  I have called in 3 prescriptions for you:  1.,  Doxycycline 100 mg 1 pill twice daily for 7 days.  Take this with a large glass of water and  food.   2.  A probiotic called Florastor, to help put good bacteria in the colon to avoid diarrhea.  If this is too expensive, you may purchase over-the-counter probiotic.  Also, eat yogurt such as Activia.  3.  For cough Mucinex DM take as directed at bedtime.    If no improvement in 3 days please call the office.  If symptoms worsen please seek care at an acute care clinic such as  [facility] or [facility].  Contact a health care provider if:  You have a fever.  Your symptoms get worse.  Your symptoms do not improve within 10 days. Get help right away if:  You have a severe headache.  You have persistent vomiting.  You have severe pain or swelling around your face or eyes.  You have vision problems.  You develop confusion.  Your neck is stiff.  You have trouble breathing   I discussed the assessment and treatment plan with the patient. The patient was provided an opportunity to ask questions and all were answered. The patient agreed with the plan and demonstrated an understanding of the instructions.   The patient was advised to call back or seek an in-person evaluation if the symptoms worsen or if the condition fails to improve as anticipated.   Michele Kinsman, NP Adult Nurse Practitioner [facility] [phone]

## 2020-09-15 ENCOUNTER — Telehealth: Payer: Self-pay

## 2020-09-15 NOTE — Telephone Encounter (Signed)
Provider on call gave patient rx of zofran for her sx.

## 2020-09-15 NOTE — Telephone Encounter (Signed)
[facility] Night - Cl TELEPHONE ADVICE RECORD [facility] Patient Name: Michele Houston Gender: Female DOB: 08-18-1986 Age: 34 Y 3 M 11 D Return Phone Number: [phone] (Primary) Address: City/State/Zip: [place] [address] Client [facility] Night - Cl Client Site [facility] - Night Physician AA - PHYSICIAN, NOT LISTED- MD Contact Type Call Who Is Calling Patient / Member / Family / Caregiver Call Type Triage / Clinical Relationship To Patient Self Return Phone Number [phone] (Primary) Chief Complaint Medication reaction Reason for Call Symptomatic / Request for Health Information Initial Comment Caller states had virtual visit with Amedeo Kinsman, NP and was prescribed Doxycycline. After taking medication she vomited. Not sure if she should still take medicine. Translation No Nurse Assessment Nurse: Colon Branch, RN, Irving Burton Date/Time Lamount Cohen Time): 09/13/2020 8:14:21 AM Confirm and document reason for call. If symptomatic, describe symptoms. ---Caller states she had virtual visit with NP and was RX'd doxycycline. Having nausea since starting the med and vomited this morning about a hour after the med. No fever or diarrhea. Does the patient have any new or worsening symptoms? ---Yes Will a triage be completed? ---Yes Related visit to physician within the last 2 weeks? ---Yes Does the PT have any chronic conditions? (i.e. diabetes, asthma, this includes High risk factors for pregnancy, etc.) ---No Is the patient pregnant or possibly pregnant? (Ask all females between the ages of 59-55) ---No Is this a behavioral health or substance abuse call? ---No Nurse: Colon Branch, RN, Irving Burton Date/Time (Eastern Time): 09/13/2020 8:22:11 AM Please select the assessment type ---Standing order Additional Documentation ---Pt nauseated since starting doxycycline; vomited x1 this morning 1hr after taking med Other  current medications? ---Yes List current medications. ---metronidazole, omeprazole, cetirizine, venlafaxine, Vyvanse Medication allergies? ---Yes List medication allergies. ---Ashley Mariner, [facility] Pharmacy name and phone number. ---[facility] in [place], [phone] PLEASE NOTE: All timestamps contained within this report are represented as [place] Standard Time. CONFIDENTIALTY NOTICE: This fax transmission is intended only for the addressee. It contains information that is legally privileged, confidential or otherwise protected from use or disclosure. If you are not the intended recipient, you are strictly prohibited from reviewing, disclosing, copying using or disseminating any of this information or taking any action in reliance on or regarding this information. If you have received this fax in error, please notify us immediately by telephone so that we can arrange for its return to [place]. Phone: [phone], Toll-Free: [phone], Fax: [phone] Page: 2 of 3 Call Id: [id number] Nurse Assessment Guidelines Guideline Title Affirmed Question Affirmed Notes Nurse Date/Time Lamount Cohen Time) Nausea Taking prescription medication that could cause nausea (e.g., narcotics/opiates, antibiotics, OCPs, many others) Colon Branch, RN, Irving Burton 09/13/2020 8:15:42 AM Disp. Time Lamount Cohen Time) Disposition Final User 09/13/2020 8:29:01 AM Pharmacy Call Colon Branch, RN, Irving Burton Reason: [facility] for Zofran called to pharmacy, voicemail left 09/13/2020 8:28:29 AM Call PCP within 24 Hours Yes Colon Branch, RN, Irving Burton Caller Disagree/Comply Comply Caller Understands Yes PreDisposition Did not know what to do Care Advice Given Per Guideline CALL PCP WITHIN 24 HOURS: * You need to discuss this with your doctor (or NP/PA) within the next 24 hours. * IF OFFICE WILL BE CLOSED: I'll page the on-call provider now. EXCEPTION: from 9 pm to 9 am. Since this isn't urgent, we'll hold the page until morning. CLEAR FLUIDS: * Take clear  fluids in small amounts until the nausea is resolved for 8 hours: CALL BACK IF: * You become worse CARE ADVICE given per Nausea (Adult) guideline. *  Sip water or rehydration liquid (Gatorade or Powerade) SOLIDS: * Gradually return to a normal diet. * Start with saltine crackers, white bread, rice, mashed potatoes, cereal, apple sauce etc. Standing Orders Preparation Additional Instructions Route Frequency Duration Nurse Comments User Name Zofran 4mg  (regular tablet or ODT) 1-2 tablets as need, # 6 Oral Every 8 Hours , RN, Colon Branch Comments User: Irving Burton, RN Date/Time Arnaldo Natal Time): 09/13/2020 8:30:05 AM Caller advised to continue doxycycline. Take with food if possible to minimize gastric upset/nausea. Advised to take Zofran with or shortly after doxycycline. PLEASE NOTE: All timestamps contained within this report are represented as 09/15/2020 Standard Time. CONFIDENTIALTY NOTICE: This fax transmission is intended only for the addressee. It contains information that is legally privileged, confidential or otherwise protected from use or disclosure. If you are not the intended recipient, you are strictly prohibited from reviewing, disclosing, copying using or disseminating any of this information or taking any action in reliance on or regarding this information. If you have received this fax in error, please notify [place] immediately by telephone so that we can arrange for its return to [place]. Phone: [phone], Toll-Free: [phone], Fax: [phone] Page: 3 of 3 Call Id: [phone] [facility] [address] [place], [place] [phone] Fax: [phone] MEDICATION ORDER [facility] Night - Cl [facility] - Night Date: 09/13/2020 From: [facility]I Department To: AA - PHYSICIAN, NOT LISTED- MD This is an approved standing order given by our call center nurse on your behalf.  Thank you. Date 09/15/2020 Time): 09/13/2020 8:06:08 AM Triage RN: 09/15/2020, RN NAME: CECYLIA BRAZILL PHONE NUMBER: [phone] (Primary) BIRTHDATE: 01-09-1986 ADDRESS: [place]/STATE[place]1987[place] [place] CALLER: Self NAME: Rx Given Preparation Additional Instructions Route Frequency Duration Nurse Comments User Name Zofran 4mg  (regular tablet or ODT) 1-2 tablets as need, # 6 Oral Every 8 Hours [id number], RN, No signature is required on standing orders.

## 2020-09-15 NOTE — Telephone Encounter (Signed)
I called Michele Houston for symptom update and had to leave a message on her machine for her to call us tomorrow with an update.

## 2020-09-16 ENCOUNTER — Other Ambulatory Visit: Payer: Self-pay | Admitting: Internal Medicine

## 2020-09-17 NOTE — Telephone Encounter (Signed)
Patient doing much better. Only sx she has left is thick mucus in throat; not really coughing but can at times. When does cough green and clear mucus. Patient still taking mucinex around the clock and doxycycline x 3 or 4 more days. She has not had another episode since she was given the zofran of nausea or vomiting. Patient thinks that was because she didn't eat before she took the medicine.

## 2020-09-22 ENCOUNTER — Telehealth: Payer: Self-pay

## 2020-09-22 ENCOUNTER — Other Ambulatory Visit: Payer: Self-pay

## 2020-09-22 ENCOUNTER — Encounter: Payer: Self-pay | Admitting: Psychiatry

## 2020-09-22 ENCOUNTER — Telehealth (INDEPENDENT_AMBULATORY_CARE_PROVIDER_SITE_OTHER): Payer: No Typology Code available for payment source | Admitting: Psychiatry

## 2020-09-22 ENCOUNTER — Other Ambulatory Visit: Payer: Self-pay | Admitting: Psychiatry

## 2020-09-22 ENCOUNTER — Encounter: Payer: Self-pay | Admitting: Obstetrics and Gynecology

## 2020-09-22 DIAGNOSIS — F410 Panic disorder [episodic paroxysmal anxiety] without agoraphobia: Secondary | ICD-10-CM | POA: Insufficient documentation

## 2020-09-22 DIAGNOSIS — F909 Attention-deficit hyperactivity disorder, unspecified type: Secondary | ICD-10-CM

## 2020-09-22 DIAGNOSIS — F3342 Major depressive disorder, recurrent, in full remission: Secondary | ICD-10-CM | POA: Diagnosis not present

## 2020-09-22 MED ORDER — ALPRAZOLAM 0.25 MG PO TABS: 0.2500 mg | ORAL_TABLET | Freq: Every day | ORAL | 1 refills | Status: DC | PRN

## 2020-09-22 MED ORDER — VENLAFAXINE HCL ER 37.5 MG PO CP24: 37.5000 mg | ORAL_CAPSULE | Freq: Every day | ORAL | 1 refills | Status: DC

## 2020-09-22 NOTE — Telephone Encounter (Signed)
OV- 10/26 Neg vaginal swab 08/2020  Spoke with pt. Pt states finished taking Flagyl Rx for vaginal odor x 1 week ago. Pt states still having vaginal odor. Rx did not help.  Pt states has not used anything else new during this time of treatment.  Denies vaginal discharge, itching, all UTI sx.    Pt states is going to son's school event right now and cannot schedule OV. Will call back for OV.  Per reviewed notes from Dr Edward Jolly 08/26/20: PLAN We discussed reasons for vaginal odor, including perspiration.  Will check for BV, yeast, trichomonas, GC/CT. If testing is negative for BV, ok for Flagyl x 1 course. May benefit from Cleocin lotion or Hibiclens for washing skin? Avoid placing products in the vagina.  Ok to continue probiotics.    Routing to Dr Edward Jolly for review, please advise

## 2020-09-22 NOTE — Progress Notes (Signed)
Virtual Visit via Video Note  I connected with Michele Houston on 09/22/20 at  1:30 PM EST by a video enabled telemedicine application and verified that I am speaking with the correct person using two identifiers.  Location Provider Location : [facility] Patient Location : Work  Participants: Patient , Provider    I discussed the limitations of evaluation and management by telemedicine and the availability of in person appointments. The patient expressed understanding and agreed to proceed.  I discussed the assessment and treatment plan with the patient. The patient was provided an opportunity to ask questions and all were answered. The patient agreed with the plan and demonstrated an understanding of the instructions.  The patient was advised to call back or seek an in-person evaluation if the symptoms worsen or if the condition fails to improve as anticipated.   BH MD OP Progress Note  09/22/2020 1:45 PM Michele Houston  MRN:  [id number]  Chief Complaint:  Chief Complaint    Follow-up     HPI: Michele Houston is a 34 year old Caucasian female, employed, lives in [place], has a history of MDD in remission, anxiety disorder unspecified, ADHD, gastroesophageal reflux disease was evaluated by telemedicine today.  Patient today reports she continues to take the venlafaxine as prescribed.  She is tolerating the 37.5 mg and it does help her.  She denies any significant anxiety symptoms.  She reports she continues to have panic attacks however it happens only once a month or so.  She tried taking the Klonopin once since her last appointment with Clinical research associate.  She however reports it made her drowsy and she had to go to sleep.  She has tried Xanax previously during flights since she has a phobia of flying.  She reports Xanax helps her to relax without making her too drowsy.  Patient continues to follow-up with her therapist and reports therapy sessions as beneficial.  Patient reports sleep as  good.  She denies any appetite changes.  She reports work is going well.  Patient denies any suicidality, homicidality or perceptual disturbances.  Patient denies any other concerns today.  Visit Diagnosis:    ICD-10-CM   1. Panic attacks  F41.0 ALPRAZolam (XANAX) 0.25 MG tablet    venlafaxine XR (EFFEXOR-XR) 37.5 MG 24 hr capsule  2. Attention deficit hyperactivity disorder (ADHD), unspecified ADHD type  F90.9   3. MDD (major depressive disorder), recurrent, in full remission (HCC)  F33.42 venlafaxine XR (EFFEXOR-XR) 37.5 MG 24 hr capsule    Past Psychiatric History: I have reviewed past psychiatric history from my progress note on 07/16/2020.  Past trials of Wellbutrin, Lexapro-suicidality, Zoloft, Celexa, Prozac-headaches, Klonopin  Past Medical History:  Past Medical History:  Diagnosis Date  . Anemia    when pregnant only  . GERD (gastroesophageal reflux disease)     Past Surgical History:  Procedure Laterality Date  . CHOLECYSTECTOMY N/A 06/03/2015   Procedure: LAPAROSCOPIC CHOLECYSTECTOMY WITH INTRAOPERATIVE CHOLANGIOGRAM;  Surgeon: Earline Mayotte, MD;  Location: [facility] ORS;  Service: General;  Laterality: N/A;  . KNEE SURGERY Left 2011   arthroscopy  . tubes in ears  baby    Family Psychiatric History: I have reviewed family psychiatric history from my progress note on 07/16/2020  Family History:  Family History  Problem Relation Age of Onset  . Hyperthyroidism Mother   . Depression Mother   . Lung cancer Father   . Depression Father   . Drug abuse Father   . Lung cancer Paternal Uncle   .  Lung cancer Paternal Grandmother   . Cervical cancer Sister   . Depression Sister   . Hypertension Brother   . Bipolar disorder Sister     Social History: Reviewed social history from my progress note on 07/16/2020 Social History   Socioeconomic History  . Marital status: Married    Spouse name: Not on file  . Number of children: 3  . Years of education: Not on file   . Highest education level: Not on file  Occupational History  . Occupation: Chief Executive Officer: [employer]  Tobacco Use  . Smoking status: Former Smoker    Packs/day: 1.00    Years: 4.00    Pack years: 4.00    Types: Cigarettes    Quit date: 05/30/2011    Years since quitting: 9.3  . Smokeless tobacco: Never Used  Vaping Use  . Vaping Use: Never used  Substance and Sexual Activity  . Alcohol use: Not Currently    Alcohol/week: 0.0 standard drinks  . Drug use: Never  . Sexual activity: Yes    Birth control/protection: None    Comment: Vasectomy in husband  Other Topics Concern  . Not on file  Social History Narrative  . Not on file   Social Determinants of Health   Financial Resource Strain:   . Difficulty of Paying Living Expenses: Not on file  Food Insecurity:   . Worried About Programme researcher, broadcasting/film/video in the Last Year: Not on file  . Ran Out of Food in the Last Year: Not on file  Transportation Needs:   . Lack of Transportation (Medical): Not on file  . Lack of Transportation (Non-Medical): Not on file  Physical Activity:   . Days of Exercise per Week: Not on file  . Minutes of Exercise per Session: Not on file  Stress:   . Feeling of Stress : Not on file  Social Connections:   . Frequency of Communication with Friends and Family: Not on file  . Frequency of Social Gatherings with Friends and Family: Not on file  . Attends Religious Services: Not on file  . Active Member of Clubs or Organizations: Not on file  . Attends Banker Meetings: Not on file  . Marital Status: Not on file    Allergies:  Allergies  Allergen Reactions  . Ceclor [Cefaclor] Hives  . Septra [Sulfamethoxazole-Trimethoprim] Nausea And Vomiting  . Tri-Sprintec [Norgestimate-Eth Estradiol] Other (See Comments)    Headaches, increased anxiety    Metabolic Disorder Labs: Lab Results  Component Value Date   HGBA1C 5.6 05/08/2020   No results found for: PROLACTIN Lab  Results  Component Value Date   CHOL 181 05/08/2020   TRIG 64.0 05/08/2020   HDL 81.80 05/08/2020   CHOLHDL 2 05/08/2020   VLDL 12.8 05/08/2020   LDLCALC 86 05/08/2020   LDLCALC 100 (H) 03/29/2019   Lab Results  Component Value Date   TSH 0.92 05/08/2020   TSH 1.14 03/29/2019    Therapeutic Level Labs: No results found for: LITHIUM No results found for: VALPROATE No components found for:  CBMZ  Current Medications: Current Outpatient Medications  Medication Sig Dispense Refill  . ALPRAZolam (XANAX) 0.25 MG tablet Take 1 tablet (0.25 mg total) by mouth daily as needed for anxiety. 12 tablet 1  . cetirizine (ZYRTEC) 10 MG tablet Take 10 mg by mouth daily.    Marland Kitchen doxycycline (VIBRA-TABS) 100 MG tablet Take 1 tablet (100 mg total) by mouth 2 (two) times  daily. 14 tablet 0  . ferrous sulfate 325 (65 FE) MG EC tablet Take 1 tablet (325 mg total) by mouth daily. 30 tablet 1  . metroNIDAZOLE (FLAGYL) 500 MG tablet Take 1 tablet (500 mg total) by mouth 2 (two) times daily. 14 tablet 0  . omeprazole (PRILOSEC) 20 MG capsule TAKE 1 CAPSULE (20 MG TOTAL) BY MOUTH DAILY. 90 capsule 0  . ondansetron (ZOFRAN-ODT) 4 MG disintegrating tablet Take 4 mg by mouth every 8 (eight) hours as needed.    . saccharomyces boulardii (FLORASTOR) 250 MG capsule Take 1 capsule (250 mg total) by mouth 2 (two) times daily for 14 days. 28 capsule 0  . venlafaxine XR (EFFEXOR-XR) 37.5 MG 24 hr capsule Take 1 capsule (37.5 mg total) by mouth daily with breakfast. 90 capsule 1  . VYVANSE 20 MG capsule Take 20 mg by mouth at bedtime.     No current facility-administered medications for this visit.     Musculoskeletal: Strength & Muscle Tone: UTA Gait & Station: UTA Patient leans: N/A  Psychiatric Specialty Exam: Review of Systems  Psychiatric/Behavioral: The patient is nervous/anxious.   All other systems reviewed and are negative.   There were no vitals taken for this visit.There is no height or weight on  file to calculate BMI.  General Appearance: Casual  Eye Contact:  Fair  Speech:  Clear and Coherent  Volume:  Normal  Mood:  Anxious improving  Affect:  Congruent  Thought Process:  Goal Directed and Descriptions of Associations: Intact  Orientation:  Full (Time, Place, and Person)  Thought Content: Logical   Suicidal Thoughts:  No  Homicidal Thoughts:  No  Memory:  Immediate;   Fair Recent;   Fair Remote;   Fair  Judgement:  Fair  Insight:  Fair  Psychomotor Activity:  Normal  Concentration:  Concentration: Fair and Attention Span: Fair  Recall:  FiservFair  Fund of Knowledge: Fair  Language: Fair  Akathisia:  No  Handed:  Right  AIMS (if indicated): uta  Assets:  Communication Skills Desire for Improvement Housing Social Support  ADL's:  Intact  Cognition: WNL  Sleep:  Fair   Screenings: GAD-7     [facility]med[facility]2020 [facility]  Total GAD-7 Score 6 10 16 18 7     PHQ2-9     Video [facility] Office Visit from [facility] Visit from [facility]2020 [facility] Visit from [facility]/2019 [facility] Visit from [facility]/06/2018 [facility]  PHQ-2 Total Score 0 0 0 1 1  PHQ-9 Total Score 0 10 2 6 6        Assessment and Plan: Michele Houston is a 34 year old Caucasian female, employed, lives in [place], has a history of depression, anxiety, ADHD was evaluated by telemedicine today.  Patient is biologically predisposed given her family history, history of trauma.  Patient with psychosocial stressors of multiple deaths in the family,  pandemic.  Patient however is currently making progress.  However due to adverse side effects we will change Klonopin to Xanax.  Patient is limiting use.  Plan as noted below.  Plan Panic attacks-improving Effexor extended release 37.5 mg p.o. daily with  breakfast Discontinue Klonopin due to side effects of drowsiness. Start Xanax 0.25 mg as needed for severe panic attacks only.  She will limit use.  She is aware about long-term risk of being on benzodiazepine therapy. I have reviewed [place] controlled substance database. Continue therapy with Ms. Brelyn Woehl  ADHD-stable Vyvanse 20 mg p.o. daily She is currently under the care of [place] attention specialist.  MDD in remission We will monitor closely  .  Follow-up in clinic in 2 months or sooner if needed.  I have spent atleast 20 minutes face to face by video with patient today. More than 50 % of the time was spent for preparing to see the patient ( e.g., review of test, records ), obtaining and to review and separately obtained history , ordering medications and test ,psychoeducation and supportive psychotherapy and care coordination,as well as documenting clinical information in electronic health record. This note was generated in part or whole with voice recognition software. Voice recognition is usually quite accurate but there are transcription errors that can and very often do occur. I apologize for any typographical errors that were not detected and corrected.        Michele Longs, MD 09/22/2020, 1:45 PM

## 2020-09-22 NOTE — Telephone Encounter (Signed)
Pt sent the following mychart message:  Michele Houston, Michele Houston [facility] Hey Dr. Edward Jolly,  I wanted to give you an update since finishing the metronidazole.  Unfortunately, I am still having issues with odor. Please let me know what our next steps are.  Thank you!

## 2020-09-23 NOTE — Telephone Encounter (Signed)
Patient can try Cleocin T 1% (Clindamycin topical solution) for external application to the vulvar area bid prn.  Disp:  30 ml bottle RF:  one  Please let me know if this is working for her.

## 2020-09-23 NOTE — Telephone Encounter (Signed)
Left message to call Azilee Pirro, CMA. °

## 2020-09-24 MED ORDER — CLINDAMYCIN PHOSPHATE 1 % EX SOLN: CUTANEOUS | 1 refills | Status: DC

## 2020-09-24 NOTE — Telephone Encounter (Signed)
Left message for pt to return call to triage RN. 

## 2020-09-24 NOTE — Telephone Encounter (Signed)
Spoke with Michele Houston. Michele Houston given update and recommendations per Dr Edward Jolly. Michele Houston agreeable to try Cleocin Rx. Rx sent to pharmacy on file. Michele Houston to return call to give update to Dr Edward Jolly in 1-2 weeks. Michele Houston verbalized understanding.  Encounter closed

## 2020-09-26 ENCOUNTER — Encounter: Payer: Self-pay | Admitting: Obstetrics and Gynecology

## 2020-09-30 ENCOUNTER — Encounter: Payer: Self-pay | Admitting: Obstetrics and Gynecology

## 2020-10-17 ENCOUNTER — Other Ambulatory Visit: Payer: Self-pay | Admitting: Internal Medicine

## 2020-10-20 ENCOUNTER — Other Ambulatory Visit: Payer: Self-pay | Admitting: Family

## 2020-10-20 ENCOUNTER — Other Ambulatory Visit: Payer: Self-pay

## 2020-10-20 MED ORDER — OMEPRAZOLE 20 MG PO CPDR
20.0000 mg | DELAYED_RELEASE_CAPSULE | Freq: Every day | ORAL | 0 refills | Status: DC
Start: 2020-10-20 — End: 2020-10-20

## 2020-11-20 ENCOUNTER — Encounter: Payer: Self-pay | Admitting: Psychiatry

## 2020-11-20 ENCOUNTER — Telehealth (INDEPENDENT_AMBULATORY_CARE_PROVIDER_SITE_OTHER): Payer: No Typology Code available for payment source | Admitting: Psychiatry

## 2020-11-20 ENCOUNTER — Other Ambulatory Visit: Payer: Self-pay

## 2020-11-20 ENCOUNTER — Other Ambulatory Visit: Payer: Self-pay | Admitting: Psychiatry

## 2020-11-20 DIAGNOSIS — F410 Panic disorder [episodic paroxysmal anxiety] without agoraphobia: Secondary | ICD-10-CM | POA: Diagnosis not present

## 2020-11-20 DIAGNOSIS — F3342 Major depressive disorder, recurrent, in full remission: Secondary | ICD-10-CM | POA: Diagnosis not present

## 2020-11-20 DIAGNOSIS — F909 Attention-deficit hyperactivity disorder, unspecified type: Secondary | ICD-10-CM | POA: Diagnosis not present

## 2020-11-20 MED ORDER — VENLAFAXINE HCL ER 75 MG PO CP24: 75.0000 mg | ORAL_CAPSULE | Freq: Every day | ORAL | 0 refills | Status: DC

## 2020-11-20 NOTE — Progress Notes (Signed)
Virtual Visit via Video Note  I connected with ANTAVIA TANDY on 11/20/20 at  1:00 PM EST by a video enabled telemedicine application and verified that I am speaking with the correct person using two identifiers.  Location Provider Location : [facility] Patient Location : Home  Participants: Patient , Provider   I discussed the limitations of evaluation and management by telemedicine and the availability of in person appointments. The patient expressed understanding and agreed to proceed.   I discussed the assessment and treatment plan with the patient. The patient was provided an opportunity to ask questions and all were answered. The patient agreed with the plan and demonstrated an understanding of the instructions.   The patient was advised to call back or seek an in-person evaluation if the symptoms worsen or if the condition fails to improve as anticipated.  BH MD OP Progress Note  11/20/2020 2:57 PM IYLAH DWORKIN  MRN:  [id number]  Chief Complaint:  Chief Complaint    Follow-up     HPI: TRULA FREDE is a 35 year old Caucasian female, employed, lives in [place], has a history of MDD in remission, anxiety disorder, ADHD, gastroesophageal reflux disease was evaluated by telemedicine today.  Patient today reports she is currently struggling with mild upper respiratory tract infection symptoms.  She reports her son tested positive for COVID-19 recently.  She got tested and is waiting for report.  She currently does not have a lot of symptoms other than mild cough and pressure in her head.  Patient reports the holidays were difficult for her.  She reports it was a lot going on and that did make her anxious.  She reports the Xanax however did help her to cope with her symptoms.  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  All this does make her anxiety worse.  She is interested in increasing her venlafaxine.  She continues to limit the use  of Xanax.  Patient has upcoming appointment scheduled with therapist.  She looks forward to that.  She reports sleep and appetite as fair.  Patient denies any suicidality, homicidality or perceptual disturbances.  Patient denies any other concerns today.  Visit Diagnosis:    ICD-10-CM   1. Panic attacks  F41.0 venlafaxine XR (EFFEXOR XR) 75 MG 24 hr capsule  2. Attention deficit hyperactivity disorder (ADHD), unspecified ADHD type  F90.9   3. MDD (major depressive disorder), recurrent, in full remission (HCC)  F33.42     Past Psychiatric History: I have reviewed past psychiatric history from my progress note on 07/16/2020.  Past trials of Wellbutrin, Lexapro-suicidality, Zoloft, Celexa, Prozac-headaches, Klonopin  Past Medical History:  Past Medical History:  Diagnosis Date  . Anemia    when pregnant only  . GERD (gastroesophageal reflux disease)     Past Surgical History:  Procedure Laterality Date  . CHOLECYSTECTOMY N/A 06/03/2015   Procedure: LAPAROSCOPIC CHOLECYSTECTOMY WITH INTRAOPERATIVE CHOLANGIOGRAM;  Surgeon: Earline Mayotte, MD;  Location: [facility] ORS;  Service: General;  Laterality: N/A;  . KNEE SURGERY Left 2011   arthroscopy  . tubes in ears  baby    Family Psychiatric History: I have reviewed family psychiatric history from my progress note on 07/16/2020  Family History:  Family History  Problem Relation Age of Onset  . Hyperthyroidism Mother   . Depression Mother   . Lung cancer Father   . Depression Father   . Drug abuse Father   . Lung  cancer Paternal Uncle   . Lung cancer Paternal Grandmother   . Cervical cancer Sister   . Depression Sister   . Hypertension Brother   . Bipolar disorder Sister     Social History: I have reviewed social history from my progress note on 07/16/2020 Social History   Socioeconomic History  . Marital status: Married    Spouse name: Not on file  . Number of children: 3  . Years of education: Not on file  . Highest  education level: Not on file  Occupational History  . Occupation: Chief Executive Officer: [employer]  Tobacco Use  . Smoking status: Former Smoker    Packs/day: 1.00    Years: 4.00    Pack years: 4.00    Types: Cigarettes    Quit date: 05/30/2011    Years since quitting: 9.4  . Smokeless tobacco: Never Used  Vaping Use  . Vaping Use: Never used  Substance and Sexual Activity  . Alcohol use: Not Currently    Alcohol/week: 0.0 standard drinks  . Drug use: Never  . Sexual activity: Yes    Birth control/protection: None    Comment: Vasectomy in husband  Other Topics Concern  . Not on file  Social History Narrative  . Not on file   Social Determinants of Health   Financial Resource Strain: Not on file  Food Insecurity: Not on file  Transportation Needs: Not on file  Physical Activity: Not on file  Stress: Not on file  Social Connections: Not on file    Allergies:  Allergies  Allergen Reactions  . Ceclor [Cefaclor] Hives  . Septra [Sulfamethoxazole-Trimethoprim] Nausea And Vomiting  . Tri-Sprintec [Norgestimate-Eth Estradiol] Other (See Comments)    Headaches, increased anxiety    Metabolic Disorder Labs: Lab Results  Component Value Date   HGBA1C 5.6 05/08/2020   No results found for: PROLACTIN Lab Results  Component Value Date   CHOL 181 05/08/2020   TRIG 64.0 05/08/2020   HDL 81.80 05/08/2020   CHOLHDL 2 05/08/2020   VLDL 12.8 05/08/2020   LDLCALC 86 05/08/2020   LDLCALC 100 (H) 03/29/2019   Lab Results  Component Value Date   TSH 0.92 05/08/2020   TSH 1.14 03/29/2019    Therapeutic Level Labs: No results found for: LITHIUM No results found for: VALPROATE No components found for:  CBMZ  Current Medications: Current Outpatient Medications  Medication Sig Dispense Refill  . venlafaxine XR (EFFEXOR XR) 75 MG 24 hr capsule Take 1 capsule (75 mg total) by mouth daily with breakfast. 90 capsule 0  . ALPRAZolam (XANAX) 0.25 MG tablet Take 1 tablet  (0.25 mg total) by mouth daily as needed for anxiety. 12 tablet 1  . cetirizine (ZYRTEC) 10 MG tablet Take 10 mg by mouth daily.    . clindamycin (CLEOCIN-T) 1 % external solution Applyto the vulvar area twice a day as needed. 30 mL 1  . ferrous sulfate 325 (65 FE) MG EC tablet Take 1 tablet (325 mg total) by mouth daily. 30 tablet 1  . omeprazole (PRILOSEC) 20 MG capsule Take 1 capsule (20 mg total) by mouth daily. 90 capsule 0  . ondansetron (ZOFRAN-ODT) 4 MG disintegrating tablet Take 4 mg by mouth every 8 (eight) hours as needed.    Marland Kitchen VYVANSE 20 MG capsule Take 20 mg by mouth at bedtime.     No current facility-administered medications for this visit.     Musculoskeletal: Strength & Muscle Tone: UTA Gait & Station:  UTA Patient leans: N/A  Psychiatric Specialty Exam: Review of Systems  HENT: Positive for congestion and sneezing.   Respiratory: Positive for cough.   Psychiatric/Behavioral: The patient is nervous/anxious.   All other systems reviewed and are negative.   There were no vitals taken for this visit.There is no height or weight on file to calculate BMI.  General Appearance: Casual  Eye Contact:  Fair  Speech:  Clear and Coherent  Volume:  Normal  Mood:  Anxious  Affect:  Congruent  Thought Process:  Goal Directed and Descriptions of Associations: Intact  Orientation:  Full (Time, Place, and Person)  Thought Content: Logical   Suicidal Thoughts:  No  Homicidal Thoughts:  No  Memory:  Immediate;   Fair Recent;   Fair Remote;   Fair  Judgement:  Fair  Insight:  Fair  Psychomotor Activity:  Normal  Concentration:  Concentration: Fair and Attention Span: Fair  Recall:  FiservFair  Fund of Knowledge: Fair  Language: Fair  Akathisia:  No  Handed:  Right  AIMS (if indicated): UTA  Assets:  Communication Skills Desire for Improvement Housing Intimacy Social Support Talents/Skills Transportation Vocational/Educational  ADL's:  Intact  Cognition: WNL  Sleep:   Fair   Screenings: GAD-7   Flowsheet Row [facility] Visit from [facility]/07/2020 [facility] Visit from [facility]2019 [facility] Visit from 03/29/2019 [facility]  Total GAD-7 Score 6 10 16 18 7     PHQ2-9   Flowsheet Row Video [facility] Visit from [facility]/07[facility]2020 [facility] Office Visit from 03/29/2019 [facility] Office Visit from 12/06/2018 [facility]  PHQ-2 Total Score 0 0 0 1 1  PHQ-9 Total Score 0 10 2 6 6        Assessment and Plan: Nonnie DoneKrista R Phillips is a 35 year old Caucasian female, employed, [place] in [place], has a history of depression, anxiety, ADHD was evaluated by telemedicine today.  Patient is biologically predisposed given her family history, history of trauma.  Patient with psychosocial stressors of multiple deaths in the family, pandemic.  Patient is currently struggling with possible COVID-19 infection, pending testing report.  Patient does struggle with anxiety symptoms and will benefit from the following plan.  Plan Panic attacks- unstable Increase venlafaxine extended release to 75 mg p.o. daily with breakfast Continue Xanax 0.25 mg as needed for severe anxiety attacks only. Patient is aware about long-term risk of being on benzodiazepine therapy and will continue to limit use. I have reviewed [place] controlled substance database. Continue psychotherapy with Ms. Council MechanicKatie Clayton-patient has upcoming appointment scheduled.  ADHD-stable Vyvanse 20 mg p.o. daily- [place] attention [facility].  MDD in remission We will monitor closely   Follow-up in clinic in 3 to 4 weeks or  sooner if needed.  I have spent atleast 20 minutes face to face by video with patient today. More than 50 % of the time was spent for preparing to see the patient ( e.g., review of test, records ), ordering medications and test ,psychoeducation and supportive psychotherapy and care coordination,as well as documenting clinical information in electronic health record. This note was generated in part or whole with voice recognition software. Voice recognition is usually quite accurate but there are transcription  errors that can and very often do occur. I apologize for any typographical errors that were not detected and corrected.        Jomarie Longs, MD 11/20/2020, 2:57 PM

## 2020-11-21 ENCOUNTER — Telehealth: Payer: Self-pay | Admitting: Nurse Practitioner

## 2020-11-21 NOTE — Telephone Encounter (Signed)
FYI  Pt called to report that she tested positive for Covid didn't give a date  She wanted to est with a Provider here. Gave her info to [facility]  Pt then hung the phone up

## 2020-11-27 ENCOUNTER — Other Ambulatory Visit: Payer: Self-pay | Admitting: Internal Medicine

## 2020-12-11 ENCOUNTER — Telehealth (INDEPENDENT_AMBULATORY_CARE_PROVIDER_SITE_OTHER): Payer: No Typology Code available for payment source | Admitting: Psychiatry

## 2020-12-11 ENCOUNTER — Other Ambulatory Visit: Payer: Self-pay

## 2020-12-11 ENCOUNTER — Encounter: Payer: Self-pay | Admitting: Psychiatry

## 2020-12-11 DIAGNOSIS — F909 Attention-deficit hyperactivity disorder, unspecified type: Secondary | ICD-10-CM

## 2020-12-11 DIAGNOSIS — F410 Panic disorder [episodic paroxysmal anxiety] without agoraphobia: Secondary | ICD-10-CM | POA: Diagnosis not present

## 2020-12-11 DIAGNOSIS — F3342 Major depressive disorder, recurrent, in full remission: Secondary | ICD-10-CM | POA: Diagnosis not present

## 2020-12-11 NOTE — Progress Notes (Signed)
Virtual Visit via Video Note  I connected with Michele Houston on 12/11/20 at  1:00 PM EST by a video enabled telemedicine application and verified that I am speaking with the correct person using two identifiers. Location Provider Location : [facility] Patient Location : Work  Participants: Patient , Provider    I discussed the limitations of evaluation and management by telemedicine and the availability of in person appointments. The patient expressed understanding and agreed to proceed. I discussed the assessment and treatment plan with the patient. The patient was provided an opportunity to ask questions and all were answered. The patient agreed with the plan and demonstrated an understanding of the instructions.  The patient was advised to call back or seek an in-person evaluation if the symptoms worsen or if the condition fails to improve as anticipated.   BH MD OP Progress Note  12/11/2020 1:20 PM Michele Houston  MRN:  [id number]  Chief Complaint:  Chief Complaint    Follow-up     HPI: Michele Houston is a 35 year old Caucasian female, employed, lives in [place], has a history of MDD in remission, panic attacks, ADHD, gastroesophageal reflux disease was evaluated by telemedicine today.  Patient reports she got infected with COVID-19 in January.  She reports she had very mild symptoms and recovered well.  She is currently back at work.  She reports since going up on the venlafaxine to 75 mg she has been feeling much better with her anxiety and panic attacks.  She denies any significant restlessness or worrying at this time.  She denies any sadness.  She denies any suicidality, homicidality or perceptual disturbances.  Patient reports she has been limiting use of Xanax and has not needed any Xanax since her last visit with Clinical research associate.  Patient reports sleep and appetite as good.  She reports she has been working out at [employer] and has been trying to lose weight.  Patient reports she  has been trying to find a new primary care provider for herself.  Patient denies any other concerns today.  Visit Diagnosis:    ICD-10-CM   1. Panic attacks  F41.0   2. Attention deficit hyperactivity disorder (ADHD), unspecified ADHD type  F90.9   3. MDD (major depressive disorder), recurrent, in full remission (HCC)  F33.42     Past Psychiatric History: I have reviewed past psychiatric history from my progress note on 07/16/2020.  Past trials of Wellbutrin, Lexapro-suicidality, Zoloft, Celexa, Prozac-headaches, Klonopin  Past Medical History:  Past Medical History:  Diagnosis Date  . Anemia    when pregnant only  . GERD (gastroesophageal reflux disease)     Past Surgical History:  Procedure Laterality Date  . CHOLECYSTECTOMY N/A 06/03/2015   Procedure: LAPAROSCOPIC CHOLECYSTECTOMY WITH INTRAOPERATIVE CHOLANGIOGRAM;  Surgeon: Earline Mayotte, MD;  Location: [facility] ORS;  Service: General;  Laterality: N/A;  . KNEE SURGERY Left 2011   arthroscopy  . tubes in ears  baby    Family Psychiatric History: I have reviewed family psychiatric history from my progress note on 07/16/2020  Family History:  Family History  Problem Relation Age of Onset  . Hyperthyroidism Mother   . Depression Mother   . Lung cancer Father   . Depression Father   . Drug abuse Father   . Lung cancer Paternal Uncle   . Lung cancer Paternal Grandmother   . Cervical cancer Sister   . Depression Sister   . Hypertension Brother   . Bipolar disorder Sister  Social History: Reviewed social history from my progress note on 07/16/2020 Social History   Socioeconomic History  . Marital status: Married    Spouse name: Not on file  . Number of children: 3  . Years of education: Not on file  . Highest education level: Not on file  Occupational History  . Occupation: Chief Executive Officer: [employer]  Tobacco Use  . Smoking status: Former Smoker    Packs/day: 1.00    Years: 4.00    Pack years: 4.00     Types: Cigarettes    Quit date: 05/30/2011    Years since quitting: 9.5  . Smokeless tobacco: Never Used  Vaping Use  . Vaping Use: Never used  Substance and Sexual Activity  . Alcohol use: Not Currently    Alcohol/week: 0.0 standard drinks  . Drug use: Never  . Sexual activity: Yes    Birth control/protection: None    Comment: Vasectomy in husband  Other Topics Concern  . Not on file  Social History Narrative  . Not on file   Social Determinants of Health   Financial Resource Strain: Not on file  Food Insecurity: Not on file  Transportation Needs: Not on file  Physical Activity: Not on file  Stress: Not on file  Social Connections: Not on file    Allergies:  Allergies  Allergen Reactions  . Ceclor [Cefaclor] Hives  . Septra [Sulfamethoxazole-Trimethoprim] Nausea And Vomiting  . Tri-Sprintec [Norgestimate-Eth Estradiol] Other (See Comments)    Headaches, increased anxiety    Metabolic Disorder Labs: Lab Results  Component Value Date   HGBA1C 5.6 05/08/2020   No results found for: PROLACTIN Lab Results  Component Value Date   CHOL 181 05/08/2020   TRIG 64.0 05/08/2020   HDL 81.80 05/08/2020   CHOLHDL 2 05/08/2020   VLDL 12.8 05/08/2020   LDLCALC 86 05/08/2020   LDLCALC 100 (H) 03/29/2019   Lab Results  Component Value Date   TSH 0.92 05/08/2020   TSH 1.14 03/29/2019    Therapeutic Level Labs: No results found for: LITHIUM No results found for: VALPROATE No components found for:  CBMZ  Current Medications: Current Outpatient Medications  Medication Sig Dispense Refill  . ALPRAZolam (XANAX) 0.25 MG tablet Take 1 tablet (0.25 mg total) by mouth daily as needed for anxiety. 12 tablet 1  . cetirizine (ZYRTEC) 10 MG tablet Take 10 mg by mouth daily.    . clindamycin (CLEOCIN-T) 1 % external solution Applyto the vulvar area twice a day as needed. 30 mL 1  . ferrous sulfate 325 (65 FE) MG EC tablet Take 1 tablet (325 mg total) by mouth daily. 30 tablet  1  . omeprazole (PRILOSEC) 20 MG capsule Take 1 capsule (20 mg total) by mouth daily. 90 capsule 0  . ondansetron (ZOFRAN-ODT) 4 MG disintegrating tablet Take 4 mg by mouth every 8 (eight) hours as needed.    . venlafaxine XR (EFFEXOR XR) 75 MG 24 hr capsule Take 1 capsule (75 mg total) by mouth daily with breakfast. 90 capsule 0  . VYVANSE 20 MG capsule Take 20 mg by mouth at bedtime.     No current facility-administered medications for this visit.     Musculoskeletal: Strength & Muscle Tone: UTA Gait & Station: normal Patient leans: N/A  Psychiatric Specialty Exam: Review of Systems  Psychiatric/Behavioral: The patient is nervous/anxious.   All other systems reviewed and are negative.   There were no vitals taken for this visit.There is no  height or weight on file to calculate BMI.  General Appearance: Casual  Eye Contact:  Fair  Speech:  Normal Rate  Volume:  Normal  Mood:  Anxious improving  Affect:  Congruent  Thought Process:  Goal Directed and Descriptions of Associations: Intact  Orientation:  Full (Time, Place, and Person)  Thought Content: Logical   Suicidal Thoughts:  No  Homicidal Thoughts:  No  Memory:  Immediate;   Fair Recent;   Fair Remote;   Fair  Judgement:  Fair  Insight:  Fair  Psychomotor Activity:  Normal  Concentration:  Concentration: Fair and Attention Span: Fair  Recall:  [employer] of Knowledge: Fair  Language: Fair  Akathisia:  No  Handed:  Right  AIMS (if indicated): UTA  Assets:  Communication Skills Desire for Improvement Housing Social Support Talents/Skills Transportation Vocational/Educational  ADL's:  Intact  Cognition: WNL  Sleep:  Fair   Screenings: GAD-7   Flowsheet Row Video Visit from 12/11/2020 in [facility] Telemedicine from 07/16/2020 in [facility] Office Visit from 05/08/2020 in [facility] [place] Office Visit from 02/07/2020 in [facility] Office Visit from 06/13/2019 in [facility]  Total GAD-7 Score 3 6 10 16 18     PHQ2-9   Flowsheet Row Video Visit from 12/11/2020 in [facility] Video Visit from 09/12/2020 in [facility] Office Visit from 05/08/2020 in [facility] Office Visit from 02/07/2020 in [facility] Office Visit from 03/29/2019 in [facility] [place]  PHQ-2 Total Score 0 0 0 0 1  PHQ-9 Total Score - 0 10 2 6     Flowsheet Row Video Visit from 12/11/2020 in [facility]  C-SSRS RISK CATEGORY No Risk       Assessment and Plan: BEULAH CAPOBIANCO is a 35 year old Caucasian female, employed, lives in [place], has a history of depression, anxiety, ADHD was evaluated by telemedicine today.  Patient is biologically predisposed given her family history, history of trauma.  Patient with psychosocial stressors of multiple deaths in the family, pandemic.  Patient is currently making progress with regards to her anxiety.  Plan as noted below.  Plan Panic attacks-improving Venlafaxine extended release 75 mg p.o. daily with breakfast Continue Xanax 0.25 mg as needed for severe anxiety attacks only. Continue psychotherapy with Ms. Zelena Bushong. GAD 7 today equals 3  ADHD-stable Vyvanse 20 mg p.o. daily-prescribed by [place] attention specialist.  MDD in remission PHQ 9 today equals 0   Follow-up in clinic in 2 months or sooner if needed.  I have spent atleast 20 minutes face to face by video with patient today. More than 50 % of the time was spent for preparing to see the patient ( e.g., review of test, records ),  ordering medications and test ,psychoeducation and supportive psychotherapy and care coordination,as well as documenting clinical information in electronic health record. This note was generated in part or whole with voice recognition software. Voice  recognition is usually quite accurate but there are transcription errors that can and very often do occur. I apologize for any typographical errors that were not detected and corrected.      Cheral Bay, MD 12/12/2020, 8:09 AM

## 2020-12-26 ENCOUNTER — Other Ambulatory Visit: Payer: Self-pay | Admitting: Internal Medicine

## 2021-01-06 ENCOUNTER — Telehealth: Payer: Self-pay | Admitting: Psychiatry

## 2021-01-06 NOTE — Telephone Encounter (Signed)
Attempted to contact patient since I received communication through [facility] health email that she had some medication problems.  Left voicemail.

## 2021-01-07 ENCOUNTER — Telehealth: Payer: Self-pay | Admitting: Psychiatry

## 2021-01-07 DIAGNOSIS — F410 Panic disorder [episodic paroxysmal anxiety] without agoraphobia: Secondary | ICD-10-CM

## 2021-01-07 DIAGNOSIS — R37 Sexual dysfunction, unspecified: Secondary | ICD-10-CM

## 2021-01-07 MED ORDER — VENLAFAXINE HCL ER 37.5 MG PO CP24: 37.5000 mg | ORAL_CAPSULE | Freq: Every day | ORAL | 1 refills | Status: DC

## 2021-01-07 NOTE — Telephone Encounter (Signed)
Returned call to patient.  She reports since going up on the dose that she has noticed side effects of sexual problems.   We will reduce venlafaxine to 37.5 mg.  She has supplies at home.  Will not send refills today.

## 2021-01-29 ENCOUNTER — Other Ambulatory Visit: Payer: Self-pay | Admitting: Internal Medicine

## 2021-01-29 ENCOUNTER — Telehealth: Payer: Self-pay

## 2021-01-29 DIAGNOSIS — R37 Sexual dysfunction, unspecified: Secondary | ICD-10-CM

## 2021-01-29 MED ORDER — VENLAFAXINE HCL ER 37.5 MG PO CP24: 37.5000 mg | ORAL_CAPSULE | Freq: Every day | ORAL | 0 refills | Status: DC

## 2021-01-29 NOTE — Telephone Encounter (Signed)
I have sent venlafaxine to pharmacy. 

## 2021-01-29 NOTE — Telephone Encounter (Signed)
pt called states she needs a 30 day supply with no refills of the venlafaxine sent to [facility]    venlafaxine XR (EFFEXOR-XR) 37.5 MG 24 hr capsule Medication Date: 01/07/2021 Department: [facility] Ordering/Authorizing: Jomarie Longs, MD    Order Providers  Prescribing Provider Encounter Provider  Jomarie Longs, MD Jomarie Longs, MD   Outpatient Medication Detail   Disp Refills Start End   venlafaxine XR (EFFEXOR-XR) 37.5 MG 24 hr capsule 30 capsule 1 01/07/2021    Sig - Route: Take 1 capsule (37.5 mg total) by mouth daily with breakfast. Patient has supplies - Oral   Class: No Print    Associated Diagnoses  Sexual dysfunction      Pharmacy  [facility] - [place], [place] - [address]

## 2021-01-31 ENCOUNTER — Other Ambulatory Visit: Payer: Self-pay

## 2021-01-31 MED ORDER — VENLAFAXINE HCL ER 37.5 MG PO CP24
37.5000 mg | ORAL_CAPSULE | Freq: Every day | ORAL | 0 refills | Status: DC
  Filled 2021-01-31: qty 30, 30d supply, fill #0

## 2021-02-03 ENCOUNTER — Other Ambulatory Visit: Payer: Self-pay

## 2021-02-09 ENCOUNTER — Other Ambulatory Visit: Payer: Self-pay

## 2021-02-09 ENCOUNTER — Encounter: Payer: Self-pay | Admitting: Psychiatry

## 2021-02-09 ENCOUNTER — Telehealth (INDEPENDENT_AMBULATORY_CARE_PROVIDER_SITE_OTHER): Payer: No Typology Code available for payment source | Admitting: Psychiatry

## 2021-02-09 DIAGNOSIS — F909 Attention-deficit hyperactivity disorder, unspecified type: Secondary | ICD-10-CM

## 2021-02-09 DIAGNOSIS — F40298 Other specified phobia: Secondary | ICD-10-CM

## 2021-02-09 DIAGNOSIS — F410 Panic disorder [episodic paroxysmal anxiety] without agoraphobia: Secondary | ICD-10-CM

## 2021-02-09 DIAGNOSIS — F3342 Major depressive disorder, recurrent, in full remission: Secondary | ICD-10-CM | POA: Diagnosis not present

## 2021-02-09 MED ORDER — VENLAFAXINE HCL 25 MG PO TABS
25.0000 mg | ORAL_TABLET | ORAL | 0 refills | Status: DC
  Filled 2021-02-09: qty 21, 21d supply, fill #0

## 2021-02-09 MED ORDER — SERTRALINE HCL 25 MG PO TABS
25.0000 mg | ORAL_TABLET | Freq: Every day | ORAL | 1 refills | Status: DC
  Filled 2021-02-09: qty 30, 30d supply, fill #0

## 2021-02-09 NOTE — Progress Notes (Signed)
Virtual Visit via Video Note  I connected with Michele Houston on 02/09/21 at  1:20 PM EDT by a video enabled telemedicine application and verified that I am speaking with the correct person using two identifiers.  Location Provider Location : [facility] Patient Location : Work  Participants: Patient , Provider   I discussed the limitations of evaluation and management by telemedicine and the availability of in person appointments. The patient expressed understanding and agreed to proceed.    I discussed the assessment and treatment plan with the patient. The patient was provided an opportunity to ask questions and all were answered. The patient agreed with the plan and demonstrated an understanding of the instructions.   The patient was advised to call back or seek an in-person evaluation if the symptoms worsen or if the condition fails to improve as anticipated.   BH MD OP Progress Note  02/09/2021 2:59 PM Michele Houston  MRN:  [id number]  Chief Complaint:  Chief Complaint    Follow-up; Anxiety     HPI: Michele Houston is a 35 year old Caucasian female employed, lives in [place], has a history of MDD in remission, panic attacks, ADHD, gastroesophageal reflux disease was evaluated by telemedicine today.  Patient today reports in spite of reducing the venlafaxine dosage she continues to struggle with side effects of sexual dysfunction.  She reports she may have tried Zoloft in the past and she may not have had those kind of side effects as far as she can remember.  She reports she is not depressed at all.  She however continues to have anxiety symptoms and the venlafaxine does keep it stable.  She hence is interested in starting Zoloft if she were to come off of the venlafaxine.  She reports sleep as stable.  She denies any significant panic attacks.  She reports last week she went through a situation with her son and that triggered some anxiety.  She however was able to cope  okay.  Patient reports her therapist is leaving since she is retired now and hence she is in the process of trying to find a new therapist.  She is motivated to continue CBT  Patient denies any suicidality, homicidality or perceptual disturbances.  Patient reports she does struggle with the fear of flying.  She reports she is going to fly to [place] soon and would like to use Xanax prior to taking her flight.  Patient denies using Xanax otherwise and rarely needs it.  Patient denies any other concerns today.  Visit Diagnosis:    ICD-10-CM   1. Panic attacks  F41.0 venlafaxine (EFFEXOR) 25 MG tablet    sertraline (ZOLOFT) 25 MG tablet  2. 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 MDD (major depressive disorder), recurrent, in full remission (HCC)  F33.42     Past Psychiatric History: I have reviewed past psychiatric history from my progress note on 07/16/2020.  Past trials of Wellbutrin, Lexapro-suicidality, Zoloft, Celexa, Prozac-headaches, Klonopin, effexor  Past Medical History:  Past Medical History:  Diagnosis Date  . Anemia    when pregnant only  . GERD (gastroesophageal reflux disease)     Past Surgical History:  Procedure Laterality Date  . CHOLECYSTECTOMY N/A 06/03/2015   Procedure: LAPAROSCOPIC CHOLECYSTECTOMY WITH INTRAOPERATIVE CHOLANGIOGRAM;  Surgeon: Earline Mayotte, MD;  Location: [facility] ORS;  Service: General;  Laterality: N/A;  . KNEE SURGERY Left 2011   arthroscopy  .  tubes in ears  baby    Family Psychiatric History: I have reviewed family psychiatric history from my progress note on 07/16/2020  Family History:  Family History  Problem Relation Age of Onset  . Hyperthyroidism Mother   . Depression Mother   . Lung cancer Father   . Depression Father   . Drug abuse Father   . Lung cancer Paternal Uncle   . Lung cancer Paternal Grandmother   . Cervical cancer Sister   .  Depression Sister   . Hypertension Brother   . Bipolar disorder Sister     Social History: I have reviewed social history from my progress note on 07/16/2020 Social History   Socioeconomic History  . Marital status: Married    Spouse name: Not on file  . Number of children: 3  . Years of education: Not on file  . Highest education level: Not on file  Occupational History  . Occupation: Chief Executive Officer: [employer]  Tobacco Use  . Smoking status: Former Smoker    Packs/day: 1.00    Years: 4.00    Pack years: 4.00    Types: Cigarettes    Quit date: 05/30/2011    Years since quitting: 9.7  . Smokeless tobacco: Never Used  Vaping Use  . Vaping Use: Never used  Substance and Sexual Activity  . Alcohol use: Not Currently    Alcohol/week: 0.0 standard drinks  . Drug use: Never  . Sexual activity: Yes    Birth control/protection: None    Comment: Vasectomy in husband  Other Topics Concern  . Not on file  Social History Narrative  . Not on file   Social Determinants of Health   Financial Resource Strain: Not on file  Food Insecurity: Not on file  Transportation Needs: Not on file  Physical Activity: Not on file  Stress: Not on file  Social Connections: Not on file    Allergies:  Allergies  Allergen Reactions  . Ceclor [Cefaclor] Hives  . Septra [Sulfamethoxazole-Trimethoprim] Nausea And Vomiting  . Tri-Sprintec [Norgestimate-Eth Estradiol] Other (See Comments)    Headaches, increased anxiety    Metabolic Disorder Labs: Lab Results  Component Value Date   HGBA1C 5.6 05/08/2020   No results found for: PROLACTIN Lab Results  Component Value Date   CHOL 181 05/08/2020   TRIG 64.0 05/08/2020   HDL 81.80 05/08/2020   CHOLHDL 2 05/08/2020   VLDL 12.8 05/08/2020   LDLCALC 86 05/08/2020   LDLCALC 100 (H) 03/29/2019   Lab Results  Component Value Date   TSH 0.92 05/08/2020   TSH 1.14 03/29/2019    Therapeutic Level Labs: No results found for:  LITHIUM No results found for: VALPROATE No components found for:  CBMZ  Current Medications: Current Outpatient Medications  Medication Sig Dispense Refill  . venlafaxine (EFFEXOR) 25 MG tablet Take 1 tablet (25 mg total) by mouth as directed. Start taking 25 mg daily for 2 weeks and reduce to 12.5 mg daily for 1 week and stop 21 tablet 0  . ALPRAZolam (XANAX) 0.25 MG tablet TAKE 1 TABLET BY MOUTH DAILY AS NEEDED FOR ANXIETY. STOP CLONAZEPAM 12 tablet 1  . cetirizine (ZYRTEC) 10 MG tablet Take 10 mg by mouth daily.    . clindamycin (CLEOCIN-T) 1 % external solution Applyto the vulvar area twice a day as needed. 30 mL 1  . ferrous sulfate 325 (65 FE) MG EC tablet Take 1 tablet (325 mg total) by mouth daily. 30 tablet  1  . lisdexamfetamine (VYVANSE) 20 MG capsule TAKE 1 CAPSULE BY MOUTH ONCE DAILY 30 capsule 0  . lisdexamfetamine (VYVANSE) 20 MG capsule TAKE 1 CAPSULE BY MOUTH DAILY 30 capsule 0  . lisdexamfetamine (VYVANSE) 20 MG capsule TAKE 1 CAPSULE BY MOUTH DAILY 30 capsule 0  . lisdexamfetamine (VYVANSE) 20 MG capsule TAKE 1 CAPSULE BY MOUTH DAILY 30 capsule 0  . lisdexamfetamine (VYVANSE) 20 MG capsule TAKE 1 CAPSULE BY MOUTH ONCE DAILY 30 capsule 0  . lisdexamfetamine (VYVANSE) 20 MG capsule TAKE 1 CAPSULE BY MOUTH DAILY 30 capsule 0  . omeprazole (PRILOSEC) 20 MG capsule TAKE 1 CAPSULE (20 MG TOTAL) BY MOUTH DAILY. 90 capsule 0  . ondansetron (ZOFRAN-ODT) 4 MG disintegrating tablet Take 4 mg by mouth every 8 (eight) hours as needed.    . sertraline (ZOLOFT) 25 MG tablet Take 1 tablet (25 mg total) by mouth daily with breakfast. 30 tablet 1  . VYVANSE 20 MG capsule Take 20 mg by mouth at bedtime.     No current facility-administered medications for this visit.     Musculoskeletal: Strength & Muscle Tone: UTA Gait & Station: UTA Patient leans: N/A  Psychiatric Specialty Exam: Review of Systems  Psychiatric/Behavioral: The patient is nervous/anxious.   All other systems  reviewed and are negative.   There were no vitals taken for this visit.There is no height or weight on file to calculate BMI.  General Appearance: Casual  Eye Contact:  Fair  Speech:  Clear and Coherent  Volume:  Normal  Mood:  Anxious Coping well  Affect:  Congruent  Thought Process:  Goal Directed and Descriptions of Associations: Intact  Orientation:  Full (Time, Place, and Person)  Thought Content: Logical   Suicidal Thoughts:  No  Homicidal Thoughts:  No  Memory:  Immediate;   Fair Recent;   Fair Remote;   Fair  Judgement:  Fair  Insight:  Fair  Psychomotor Activity:  Normal  Concentration:  Concentration: Fair and Attention Span: Fair  Recall:  [employer] of Knowledge: Fair  Language: Fair  Akathisia:  No  Handed:  Right  AIMS (if indicated): UTA  Assets:  Communication Skills Desire for Improvement Housing Social Support  ADL's:  Intact  Cognition: WNL  Sleep:  Fair   Screenings: GAD-7   Flowsheet Row Video Visit from 12/11/2020 in [facility] Telemedicine from 07/16/2020 in [facility] Office Visit from 05/08/2020 in [facility] [place] Office Visit from 02/07/2020 in [facility] [place] Office Visit from 06/13/2019 in [facility]  Total GAD-7 Score 3 6 10 16 18     PHQ2-9   Flowsheet Row Video Visit from 12/11/2020 in [facility] Video Visit from 09/12/2020 in [facility] Office Visit from 05/08/2020 in [facility] Office Visit from 02/07/2020 in [facility] [place] Office Visit from 03/29/2019 in [facility]  PHQ-2 Total Score 0 0 0 0 1  PHQ-9 Total Score -- 0 10 2 6     Flowsheet Row Video Visit from 12/11/2020 in [facility]  C-SSRS RISK CATEGORY No Risk       Assessment and Plan: JASREET DICKIE is a 35 year old Caucasian female,  employed, lives in [place], has a history of depression, anxiety, ADHD was evaluated by telemedicine today.  Patient is biologically predisposed given family history of trauma.  Patient with psychosocial stressors of multiple deaths in the family, pandemic.  Patient is currently struggling with side effects to venlafaxine and will benefit from the following plan.  Plan Panic attacks-improving Taper off venlafaxine. Start venlafaxine 25 mg p.o. daily for the next 2 weeks, 12.5 mg p.o. daily for 1 week and stop taking it. Start Zoloft 25 mg p.o. daily with breakfast Continue CBT, patient is in the process of finding a new therapist.  ADHD-stable Vyvanse 20 mg p.o. daily-prescribed by [place] attention specialist  Specific phobia-flying-unstable Patient advised to make use of Xanax 0.25 mg as needed prior to her flight. Continue CBT  MDD in remission Will monitor closely  Discussed drug to drug interaction, provided medication education, discussed serotonin syndrome.  Follow-up in clinic in 4 weeks or sooner if needed.  This note was generated in part or whole with voice recognition software. Voice recognition is usually quite accurate but there are transcription errors that can and very often do occur. I apologize for any typographical errors that were not detected and corrected.      Jomarie LongsSaramma Miyoshi Ligas, MD 02/09/2021, 2:59 PM

## 2021-02-09 NOTE — Patient Instructions (Signed)
Sertraline Tablets What is this medicine? SERTRALINE (SER tra leen) is used to treat depression. It may also be used to treat obsessive compulsive disorder, panic disorder, post-trauma stress disorder, premenstrual dysphoric disorder (PMDD) or social anxiety. This medicine may be used for other purposes; ask your health care provider or pharmacist if you have questions. COMMON BRAND NAME(S): Zoloft What should I tell my health care provider before I take this medicine? They need to know if you have any of these conditions:  bleeding disorders  bipolar disorder or a family history of bipolar disorder  glaucoma  heart disease  high blood pressure  history of irregular heartbeat  history of low levels of calcium, magnesium, or potassium in the blood  if you often drink alcohol  liver disease  receiving electroconvulsive therapy  seizures  suicidal thoughts, plans, or attempt; a previous suicide attempt by you or a family member  take medicines that treat or prevent blood clots  thyroid disease  an unusual or allergic reaction to sertraline, other medicines, foods, dyes, or preservatives  pregnant or trying to get pregnant  breast-feeding How should I use this medicine? Take this medicine by mouth with a glass of water. Follow the directions on the prescription label. You can take it with or without food. Take your medicine at regular intervals. Do not take your medicine more often than directed. Do not stop taking this medicine suddenly except upon the advice of your doctor. Stopping this medicine too quickly may cause serious side effects or your condition may worsen. A special MedGuide will be given to you by the pharmacist with each prescription and refill. Be sure to read this information carefully each time. Talk to your pediatrician regarding the use of this medicine in children. While this drug may be prescribed for children as young as 7 years for selected conditions,  precautions do apply. Overdosage: If you think you have taken too much of this medicine contact a poison control center or emergency room at once. NOTE: This medicine is only for you. Do not share this medicine with others. What if I miss a dose? If you miss a dose, take it as soon as you can. If it is almost time for your next dose, take only that dose. Do not take double or extra doses. What may interact with this medicine? Do not take this medicine with any of the following medications:  cisapride  dronedarone  linezolid  MAOIs like Carbex, Eldepryl, Marplan, Nardil, and Parnate  methylene blue (injected into a vein)  pimozide  thioridazine This medicine may also interact with the following medications:  alcohol  amphetamines  aspirin and aspirin-like medicines  certain medicines for depression, anxiety, or psychotic disturbances  certain medicines for fungal infections like ketoconazole, fluconazole, posaconazole, and itraconazole  certain medicines for irregular heart beat like flecainide, quinidine, propafenone  certain medicines for migraine headaches like almotriptan, eletriptan, frovatriptan, naratriptan, rizatriptan, sumatriptan, zolmitriptan  certain medicines for sleep  certain medicines for seizures like carbamazepine, valproic acid, phenytoin  certain medicines that treat or prevent blood clots like warfarin, enoxaparin, dalteparin  cimetidine  digoxin  diuretics  fentanyl  isoniazid  lithium  NSAIDs, medicines for pain and inflammation, like ibuprofen or naproxen  other medicines that prolong the QT interval (cause an abnormal heart rhythm) like dofetilide  rasagiline  safinamide  supplements like St. John's wort, kava kava, valerian  tolbutamide  tramadol  tryptophan This list may not describe all possible interactions. Give your health care   provider a list of all the medicines, herbs, non-prescription drugs, or dietary supplements  you use. Also tell them if you smoke, drink alcohol, or use illegal drugs. Some items may interact with your medicine. What should I watch for while using this medicine? Tell your doctor if your symptoms do not get better or if they get worse. Visit your doctor or health care professional for regular checks on your progress. Because it may take several weeks to see the full effects of this medicine, it is important to continue your treatment as prescribed by your doctor. Patients and their families should watch out for new or worsening thoughts of suicide or depression. Also watch out for sudden changes in feelings such as feeling anxious, agitated, panicky, irritable, hostile, aggressive, impulsive, severely restless, overly excited and hyperactive, or not being able to sleep. If this happens, especially at the beginning of treatment or after a change in dose, call your health care professional. Bonita Quin may get drowsy or dizzy. Do not drive, use machinery, or do anything that needs mental alertness until you know how this medicine affects you. Do not stand or sit up quickly, especially if you are an older patient. This reduces the risk of dizzy or fainting spells. Alcohol may interfere with the effect of this medicine. Avoid alcoholic drinks. Your mouth may get dry. Chewing sugarless gum or sucking hard candy, and drinking plenty of water may help. Contact your doctor if the problem does not go away or is severe. What side effects may I notice from receiving this medicine? Side effects that you should report to your doctor or health care professional as soon as possible:  allergic reactions like skin rash, itching or hives, swelling of the face, lips, or tongue  anxious  black, tarry stools  changes in vision  confusion  elevated mood, decreased need for sleep, racing thoughts, impulsive behavior  eye pain  fast, irregular heartbeat  feeling faint or lightheaded, falls  feeling agitated,  angry, or irritable  hallucination, loss of contact with reality  loss of balance or coordination  loss of memory  painful or prolonged erections  restlessness, pacing, inability to keep still  seizures  stiff muscles  suicidal thoughts or other mood changes  trouble sleeping  unusual bleeding or bruising  unusually weak or tired  vomiting Side effects that usually do not require medical attention (report to your doctor or health care professional if they continue or are bothersome):  change in appetite or weight  change in sex drive or performance  diarrhea  increased sweating  indigestion, nausea  tremors This list may not describe all possible side effects. Call your doctor for medical advice about side effects. You may report side effects to FDA at 1-800-FDA-1088. Where should I keep my medicine? Keep out of the reach of children. Store at room temperature between 15 and 30 degrees C (59 and 86 degrees F). Throw away any unused medicine after the expiration date. NOTE: This sheet is a summary. It may not cover all possible information. If you have questions about this medicine, talk to your doctor, pharmacist, or health care provider.  2021 Elsevier/Gold Standard (2020-08-28 09:18:23) Serotonin Syndrome Serotonin is a chemical in your body (neurotransmitter) that helps to control several functions, such as:  Brain and nerve cell function.  Mood and emotions.  Memory.  Eating.  Sleeping.  Sexual activity.  Stress response. Having too much serotonin in your body can cause serotonin syndrome. This condition can be harmful  to your brain and nerve cells. This can be a life-threatening condition. What are the causes? This condition may be caused by taking medicines or drugs that increase the level of serotonin in your body, such as:  Antidepressant medicines.  Migraine medicines.  Certain pain medicines.  Certain drugs, including ecstasy, LSD,  cocaine, and amphetamines.  Over-the-counter cough or cold medicines that contain dextromethorphan.  Certain herbal supplements, including St. John's wort, ginseng, and nutmeg. This condition usually occurs when you take these medicines or drugs in combination, but it can also happen with a high dose of a single medicine or drug. What increases the risk? You are more likely to develop this condition if:  You just started taking a medicine or drug that increases the level of serotonin in the body.  You recently increased the dose of a medicine or drug that increases the level of serotonin in the body.  You take more than one medicine or drug that increases the level of serotonin in the body. What are the signs or symptoms? Symptoms of this condition usually start within several hours of taking a medicine or drug. Symptoms may be mild or severe. Mild symptoms include:  Sweating.  Restlessness or agitation.  Muscle twitching or stiffness.  Rapid heart rate.  Nausea and vomiting.  Diarrhea.  Headache.  Shivering or goose bumps.  Confusion. Severe symptoms include:  Irregular heartbeat.  Seizures.  Loss of consciousness.  High fever. How is this diagnosed? This condition may be diagnosed based on:  Your medical history.  A physical exam.  Your prior use of drugs and medicines.  Blood or urine tests. These may be used to rule out other causes of your symptoms. How is this treated? The treatment for this condition depends on the severity of your symptoms.  For mild cases, stopping the medicine or drug that caused your condition is usually all that is needed.  For moderate to severe cases, treatment in a hospital may be needed to prevent or manage life-threatening symptoms. This may include medicines to control your symptoms, IV fluids, interventions to support your breathing, and treatments to control your body temperature. Follow these instructions at  home: Medicines  Take over-the-counter and prescription medicines only as told by your health care provider. This is important.  Check with your health care provider before you start taking any new prescriptions, over-the-counter medicines, herbs, or supplements.  Avoid combining any medicines that can cause this condition to occur.   Lifestyle  Maintain a healthy lifestyle. ? Eat a healthy diet that includes plenty of vegetables, fruits, whole grains, low-fat dairy products, and lean protein. Do not eat a lot of foods that are high in fat, added sugars, or salt. ? Get the right amount and quality of sleep. Most adults need 7-9 hours of sleep each night. ? Make time to exercise, even if it is only for short periods of time. Most adults should exercise for at least 150 minutes each week. ? Do not drink alcohol. ? Do not use illegal drugs, and do not take medicines for reasons other than they are prescribed.   General instructions  Do not use any products that contain nicotine or tobacco, such as cigarettes and e-cigarettes. If you need help quitting, ask your health care provider.  Keep all follow-up visits as told by your health care provider. This is important. Contact a health care provider if:  Your symptoms do not improve or they get worse. Get help right away if  you:  Have worsening confusion, severe headache, chest pain, high fever, seizures, or loss of consciousness.  Experience serious side effects of medicine, such as swelling of your face, lips, tongue, or throat.  Have serious thoughts about hurting yourself or others. These symptoms may represent a serious problem that is an emergency. Do not wait to see if the symptoms will go away. Get medical help right away. Call your local emergency services (911 in the [place] Do not drive yourself to the hospital. If you ever feel like you may hurt yourself or others, or have thoughts about taking your own life, get help right away.  You can go to your nearest emergency department or call:  Your local emergency services (911 in the [place]  A suicide crisis helpline, such as the National Suicide Prevention Lifeline at 934-195-6430. This is open 24 hours a day. Summary  Serotonin is a brain chemical that helps to regulate the nervous system. High levels of serotonin in the body can cause serotonin syndrome, which is a very dangerous condition.  This condition may be caused by taking medicines or drugs that increase the level of serotonin in your body.  Treatment depends on the severity of your symptoms. For mild cases, stopping the medicine or drug that caused your condition is usually all that is needed.  Check with your health care provider before you start taking any new prescriptions, over-the-counter medicines, herbs, or supplements. This information is not intended to replace advice given to you by your health care provider. Make sure you discuss any questions you have with your health care provider. Document Revised: 11/25/2017 Document Reviewed: 11/25/2017 Elsevier Patient Education  2021 [employer].

## 2021-02-10 ENCOUNTER — Other Ambulatory Visit: Payer: Self-pay

## 2021-03-03 ENCOUNTER — Other Ambulatory Visit: Payer: Self-pay

## 2021-03-03 MED ORDER — ONDANSETRON HCL 4 MG PO TABS
ORAL_TABLET | ORAL | 0 refills | Status: DC
  Filled 2021-03-03: qty 20, 7d supply, fill #0

## 2021-03-03 MED ORDER — OMEPRAZOLE 20 MG PO CPDR
DELAYED_RELEASE_CAPSULE | ORAL | 1 refills | Status: DC
  Filled 2021-03-03: qty 90, 90d supply, fill #0

## 2021-03-03 MED ORDER — DOXYCYCLINE HYCLATE 100 MG PO CAPS
ORAL_CAPSULE | ORAL | 0 refills | Status: DC
  Filled 2021-03-03: qty 20, 10d supply, fill #0

## 2021-03-03 MED ORDER — CIPROFLOXACIN-DEXAMETHASONE 0.3-0.1 % OT SUSP
OTIC | 0 refills | Status: DC
  Filled 2021-03-03: qty 7.5, 14d supply, fill #0

## 2021-03-09 ENCOUNTER — Other Ambulatory Visit: Payer: Self-pay

## 2021-03-09 MED ORDER — VYVANSE 20 MG PO CAPS
ORAL_CAPSULE | ORAL | 0 refills | Status: DC
  Filled 2021-03-09: qty 30, 30d supply, fill #0

## 2021-03-12 ENCOUNTER — Other Ambulatory Visit: Payer: Self-pay

## 2021-03-12 ENCOUNTER — Encounter: Payer: Self-pay | Admitting: Psychiatry

## 2021-03-12 ENCOUNTER — Telehealth (INDEPENDENT_AMBULATORY_CARE_PROVIDER_SITE_OTHER): Payer: No Typology Code available for payment source | Admitting: Psychiatry

## 2021-03-12 DIAGNOSIS — F909 Attention-deficit hyperactivity disorder, unspecified type: Secondary | ICD-10-CM

## 2021-03-12 DIAGNOSIS — F410 Panic disorder [episodic paroxysmal anxiety] without agoraphobia: Secondary | ICD-10-CM | POA: Diagnosis not present

## 2021-03-12 DIAGNOSIS — F3342 Major depressive disorder, recurrent, in full remission: Secondary | ICD-10-CM

## 2021-03-12 DIAGNOSIS — F40298 Other specified phobia: Secondary | ICD-10-CM | POA: Diagnosis not present

## 2021-03-12 MED ORDER — SERTRALINE HCL 25 MG PO TABS
25.0000 mg | ORAL_TABLET | Freq: Every day | ORAL | 0 refills | Status: DC
  Filled 2021-03-12: qty 90, 90d supply, fill #0

## 2021-03-12 NOTE — Progress Notes (Signed)
Virtual Visit via Video Note  I connected with Michele Houston on 03/12/21 at  3:40 PM EDT by a video enabled telemedicine application and verified that I am speaking with the correct person using two identifiers.  Location Provider Location : [facility] Patient Location : Work  Participants: Patient , Provider   I discussed the limitations of evaluation and management by telemedicine and the availability of in person appointments. The patient expressed understanding and agreed to proceed.    I discussed the assessment and treatment plan with the patient. The patient was provided an opportunity to ask questions and all were answered. The patient agreed with the plan and demonstrated an understanding of the instructions.   The patient was advised to call back or seek an in-person evaluation if the symptoms worsen or if the condition fails to improve as anticipated.   BH MD/PA/NP OP Progress Note  03/12/2021 4:01 PM Michele Houston  MRN:  [id number]  Chief Complaint:  Chief Complaint    Follow-up; Anxiety     HPI: Michele Houston is a 35 year old Caucasian female, employed, lives in [place], has a history of MDD in remission, panic attacks, ADHD, gastroesophageal reflux disease was evaluated by telemedicine today.  Patient today reports overall she is doing well without the venlafaxine.  She was able to taper it off without any worsening anxiety symptoms.  She reports she is currently on the Zoloft.  She reports her sexual side effects from venlafaxine has improved.  She does not have the same side effects with Zoloft at this time.  Patient however reports she is currently going through situational stressors.  She is worried about her brother's health problems.  That does make her anxious.  She did have episodes of sadness about that recently.  She however denies any anhedonia, other depressive symptoms.  She reports sleep is overall okay.  Patient reports she had to take at least 5 of her  Xanax when she went on the trip to [place].  She reports she has this phobia of flying and the Xanax helped her.  Patient also had to take 1 Xanax this morning while coming to work due to work-related stressors.  She however reports she has not been using much at all and has been limiting use.  Patient denies any suicidality, homicidality or perceptual disturbances.  Patient denies any other concerns today.  Visit Diagnosis:    ICD-10-CM   1. Panic attacks  F41.0 sertraline (ZOLOFT) 25 MG tablet  2. 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 MDD (major depressive disorder), recurrent, in full remission (HCC)  F33.42     Past Psychiatric History: I have reviewed past psychiatric history from manage on 07/16/2020.  Past trials of Wellbutrin, Lexapro-suicidality, Zoloft, Celexa, Prozac-headaches, Klonopin, Effexor  Past Medical History:  Past Medical History:  Diagnosis Date  . Anemia    when pregnant only  . GERD (gastroesophageal reflux disease)     Past Surgical History:  Procedure Laterality Date  . CHOLECYSTECTOMY N/A 06/03/2015   Procedure: LAPAROSCOPIC CHOLECYSTECTOMY WITH INTRAOPERATIVE CHOLANGIOGRAM;  Surgeon: Earline Mayotte, MD;  Location: [facility] ORS;  Service: General;  Laterality: N/A;  . KNEE SURGERY Left 2011   arthroscopy  . tubes in ears  baby    Family Psychiatric History: I have reviewed family psychiatric history from progress note on 07/16/2020.  Family History:  Family History  Problem Relation Age  of Onset  . Hyperthyroidism Mother   . Depression Mother   . Lung cancer Father   . Depression Father   . Drug abuse Father   . Lung cancer Paternal Uncle   . Lung cancer Paternal Grandmother   . Cervical cancer Sister   . Depression Sister   . Hypertension Brother   . Bipolar disorder Sister     Social History: Reviewed social history from progress note on 07/16/2020. Social  History   Socioeconomic History  . Marital status: Married    Spouse name: Not on file  . Number of children: 3  . Years of education: Not on file  . Highest education level: Not on file  Occupational History  . Occupation: Chief Executive Officer: [employer]  Tobacco Use  . Smoking status: Former Smoker    Packs/day: 1.00    Years: 4.00    Pack years: 4.00    Types: Cigarettes    Quit date: 05/30/2011    Years since quitting: 9.7  . Smokeless tobacco: Never Used  Vaping Use  . Vaping Use: Never used  Substance and Sexual Activity  . Alcohol use: Not Currently    Alcohol/week: 0.0 standard drinks  . Drug use: Never  . Sexual activity: Yes    Birth control/protection: None    Comment: Vasectomy in husband  Other Topics Concern  . Not on file  Social History Narrative  . Not on file   Social Determinants of Health   Financial Resource Strain: Not on file  Food Insecurity: Not on file  Transportation Needs: Not on file  Physical Activity: Not on file  Stress: Not on file  Social Connections: Not on file    Allergies:  Allergies  Allergen Reactions  . Ceclor [Cefaclor] Hives  . Septra [Sulfamethoxazole-Trimethoprim] Nausea And Vomiting  . Tri-Sprintec [Norgestimate-Eth Estradiol] Other (See Comments)    Headaches, increased anxiety    Metabolic Disorder Labs: Lab Results  Component Value Date   HGBA1C 5.6 05/08/2020   No results found for: PROLACTIN Lab Results  Component Value Date   CHOL 181 05/08/2020   TRIG 64.0 05/08/2020   HDL 81.80 05/08/2020   CHOLHDL 2 05/08/2020   VLDL 12.8 05/08/2020   LDLCALC 86 05/08/2020   LDLCALC 100 (H) 03/29/2019   Lab Results  Component Value Date   TSH 0.92 05/08/2020   TSH 1.14 03/29/2019    Therapeutic Level Labs: No results found for: LITHIUM No results found for: VALPROATE No components found for:  CBMZ  Current Medications: Current Outpatient Medications  Medication Sig Dispense Refill  .  ALPRAZolam (XANAX) 0.25 MG tablet TAKE 1 TABLET BY MOUTH DAILY AS NEEDED FOR ANXIETY. STOP CLONAZEPAM 12 tablet 1  . cetirizine (ZYRTEC) 10 MG tablet Take 10 mg by mouth daily.    . ciprofloxacin-dexamethasone (CIPRODEX) OTIC suspension Place 4 drops into the left ear 2 (two) times daily for 10 days 7.5 mL 0  . clindamycin (CLEOCIN-T) 1 % external solution Applyto the vulvar area twice a day as needed. 30 mL 1  . doxycycline (VIBRAMYCIN) 100 MG capsule Take 1 capsule (100 mg total) by mouth 2 (two) times daily for 10 days 20 capsule 0  . ferrous sulfate 325 (65 FE) MG EC tablet Take 1 tablet (325 mg total) by mouth daily. 30 tablet 1  . lisdexamfetamine (VYVANSE) 20 MG capsule TAKE 1 CAPSULE BY MOUTH ONCE DAILY 30 capsule 0  . lisdexamfetamine (VYVANSE) 20 MG capsule TAKE 1  CAPSULE BY MOUTH DAILY 30 capsule 0  . lisdexamfetamine (VYVANSE) 20 MG capsule TAKE 1 CAPSULE BY MOUTH DAILY 30 capsule 0  . lisdexamfetamine (VYVANSE) 20 MG capsule TAKE 1 CAPSULE BY MOUTH DAILY 30 capsule 0  . lisdexamfetamine (VYVANSE) 20 MG capsule TAKE 1 CAPSULE BY MOUTH ONCE DAILY 30 capsule 0  . lisdexamfetamine (VYVANSE) 20 MG capsule TAKE 1 CAPSULE BY MOUTH DAILY 30 capsule 0  . lisdexamfetamine (VYVANSE) 20 MG capsule take 1 capsule by mouth daily 30 capsule 0  . meloxicam (MOBIC) 15 MG tablet Take by mouth.    Marland Kitchen omeprazole (PRILOSEC) 20 MG capsule TAKE 1 CAPSULE (20 MG TOTAL) BY MOUTH DAILY. 90 capsule 0  . omeprazole (PRILOSEC) 20 MG capsule Take 1 capsule (20 mg total) by mouth once daily 90 capsule 1  . ondansetron (ZOFRAN) 4 MG tablet Take 1 tablet (4 mg total) by mouth every 8 (eight) hours as needed for Nausea for up to 7 days 20 tablet 0  . ondansetron (ZOFRAN-ODT) 4 MG disintegrating tablet Take 4 mg by mouth every 8 (eight) hours as needed.    . sertraline (ZOLOFT) 25 MG tablet Take 1 tablet (25 mg total) by mouth daily with breakfast. 90 tablet 0  . VYVANSE 20 MG capsule Take 20 mg by mouth at bedtime.      No current facility-administered medications for this visit.     Musculoskeletal: Strength & Muscle Tone: UTA Gait & Station: normal Patient leans: N/A  Psychiatric Specialty Exam: Review of Systems  HENT: Positive for ear pain.   Psychiatric/Behavioral: The patient is nervous/anxious.   All other systems reviewed and are negative.   There were no vitals taken for this visit.There is no height or weight on file to calculate BMI.  General Appearance: Casual  Eye Contact:  Fair  Speech:  Clear and Coherent  Volume:  Normal  Mood:  Anxious Coping well  Affect:  Congruent  Thought Process:  Goal Directed and Descriptions of Associations: Intact  Orientation:  Full (Time, Place, and Person)  Thought Content: Logical   Suicidal Thoughts:  No  Homicidal Thoughts:  No  Memory:  Immediate;   Fair Recent;   Fair Remote;   Fair  Judgement:  Fair  Insight:  Fair  Psychomotor Activity:  Normal  Concentration:  Concentration: Fair and Attention Span: Fair  Recall:  [employer] of Knowledge: Fair  Language: Fair  Akathisia:  No  Handed:  Right  AIMS (if indicated): UTA  Assets:  Communication Skills Desire for Improvement Housing Resilience Social Support  ADL's:  Intact  Cognition: WNL  Sleep:  Fair   Screenings: GAD-7   Flowsheet Row Video Visit from 03/12/2021 in [facility] Video Visit from 12/11/2020 in [facility] Telemedicine from 07/16/2020 in [facility] Office Visit from 05/08/2020 in [facility] [place] Office Visit from 02/07/2020 in [facility] [place]  Total GAD-7 Score 1 3 6 10 16     PHQ2-9   Flowsheet Row Video Visit from 03/12/2021 in [facility] Video Visit from 12/11/2020 in [facility] Video Visit from 09/12/2020 in [facility] Office Visit from 05/08/2020 in [facility]  [place] Office Visit from 02/07/2020 in [facility] [place]  PHQ-2 Total Score 1 0 0 0 0  PHQ-9 Total Score -- -- 0 10 2    Flowsheet Row Video Visit from 12/11/2020 in [facility]  C-SSRS  RISK CATEGORY No Risk       Assessment and Plan: Michele Houston is a 35 year old Caucasian female, employed, lives in [place], has a history of depression, anxiety, ADHD was evaluated by telemedicine today.  Patient is biologically predisposed given her family history.  Patient with psychosocial stressors of multiple deaths in the family, pandemic, brother's health problems.  Patient is currently tolerating the Zoloft well and will benefit from the following plan.  Plan Panic attacks- stable Zoloft 25 mg p.o. daily with breakfast Continue CBT as needed  ADHD-stable Vyvanse 20 mg p.o. daily-prescribed by [place] attention specialist  Specific phobia-flying- improving Xanax 0.25 mg as needed prior to her flight.  CBT as needed  Continue to follow up with primary care provider for ear infection.  Follow-up in clinic in 6 weeks or sooner if needed.  This note was generated in part or whole with voice recognition software. Voice recognition is usually quite accurate but there are transcription errors that can and very often do occur. I apologize for any typographical errors that were not detected and corrected.      Jomarie LongsSaramma Atalia Litzinger, MD 03/13/2021, 8:23 AM

## 2021-03-26 ENCOUNTER — Other Ambulatory Visit: Payer: Self-pay

## 2021-03-26 MED ORDER — METHYLPREDNISOLONE 4 MG PO TBPK
ORAL_TABLET | ORAL | 0 refills | Status: DC
  Filled 2021-03-26: qty 21, 6d supply, fill #0

## 2021-03-26 MED ORDER — MELOXICAM 15 MG PO TABS
ORAL_TABLET | ORAL | 0 refills | Status: DC
  Filled 2021-03-26: qty 30, 30d supply, fill #0

## 2021-04-07 ENCOUNTER — Other Ambulatory Visit: Payer: Self-pay

## 2021-04-07 MED ORDER — VYVANSE 20 MG PO CAPS
ORAL_CAPSULE | ORAL | 0 refills | Status: DC
  Filled 2021-04-07: qty 30, 30d supply, fill #0

## 2021-04-14 ENCOUNTER — Telehealth: Payer: Self-pay

## 2021-04-14 ENCOUNTER — Other Ambulatory Visit: Payer: Self-pay

## 2021-04-14 DIAGNOSIS — F410 Panic disorder [episodic paroxysmal anxiety] without agoraphobia: Secondary | ICD-10-CM

## 2021-04-14 MED ORDER — ALPRAZOLAM 0.25 MG PO TABS
0.2500 mg | ORAL_TABLET | Freq: Every day | ORAL | 0 refills | Status: DC | PRN
  Filled 2021-04-14: qty 26, 13d supply, fill #0
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
## 2021-04-14 NOTE — Telephone Encounter (Signed)
pt called states she needs a refill on her xanax she used the last one yesterday she stated that she had a traumatic event yesterday and she would not leave any details she stated that she let Dr. Elna Breslow know but she wants dr. Elna Breslow to call her today

## 2021-04-22 ENCOUNTER — Encounter: Payer: Self-pay | Admitting: Psychiatry

## 2021-04-22 ENCOUNTER — Other Ambulatory Visit: Payer: Self-pay

## 2021-04-22 ENCOUNTER — Telehealth (INDEPENDENT_AMBULATORY_CARE_PROVIDER_SITE_OTHER): Payer: No Typology Code available for payment source | Admitting: Psychiatry

## 2021-04-22 DIAGNOSIS — F410 Panic disorder [episodic paroxysmal anxiety] without agoraphobia: Secondary | ICD-10-CM

## 2021-04-22 DIAGNOSIS — F40298 Other specified phobia: Secondary | ICD-10-CM

## 2021-04-22 DIAGNOSIS — F3342 Major depressive disorder, recurrent, in full remission: Secondary | ICD-10-CM | POA: Diagnosis not present

## 2021-04-22 DIAGNOSIS — F900 Attention-deficit hyperactivity disorder, predominantly inattentive type: Secondary | ICD-10-CM | POA: Diagnosis not present
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
## 2021-05-13 ENCOUNTER — Other Ambulatory Visit: Payer: Self-pay

## 2021-05-13 MED ORDER — OMEPRAZOLE 40 MG PO CPDR
DELAYED_RELEASE_CAPSULE | ORAL | 12 refills | Status: DC
  Filled 2021-05-13: qty 90, 90d supply, fill #0
  Filled 2021-08-21: qty 90, 90d supply, fill #1
  Filled 2021-11-19: qty 90, 90d supply, fill #2
  Filled 2022-02-16: qty 90, 90d supply, fill #3

## 2021-05-13 MED ORDER — MELOXICAM 15 MG PO TABS
ORAL_TABLET | ORAL | 3 refills | Status: DC
  Filled 2021-05-13: qty 40, 40d supply, fill #0

## 2021-05-13 MED ORDER — VYVANSE 20 MG PO CAPS
ORAL_CAPSULE | ORAL | 0 refills | Status: DC
  Filled 2021-05-13: qty 30, 30d supply, fill #0

## 2021-05-14 ENCOUNTER — Other Ambulatory Visit: Payer: Self-pay

## 2021-05-27 ENCOUNTER — Other Ambulatory Visit (HOSPITAL_COMMUNITY): Payer: Self-pay

## 2021-05-27 MED ORDER — AZITHROMYCIN 250 MG PO TABS
ORAL_TABLET | ORAL | 0 refills | Status: DC
  Filled 2021-05-27: qty 6, 5d supply, fill #0

## 2021-05-27 MED ORDER — HYDROXYUREA 500 MG PO CAPS
500.0000 mg | ORAL_CAPSULE | Freq: Two times a day (BID) | ORAL | 0 refills | Status: DC
  Filled 2021-05-27: qty 10, 5d supply, fill #0

## 2021-05-27 MED ORDER — FOLIC ACID 1 MG PO TABS
1.0000 mg | ORAL_TABLET | Freq: Two times a day (BID) | ORAL | 0 refills | Status: DC
  Filled 2021-05-27: qty 10, 5d supply, fill #0

## 2021-05-27 MED ORDER — DEXAMETHASONE 2 MG PO TABS
ORAL_TABLET | ORAL | 0 refills | Status: DC
  Filled 2021-05-27: qty 8, 5d supply, fill #0

## 2021-05-28 ENCOUNTER — Other Ambulatory Visit (HOSPITAL_COMMUNITY): Payer: Self-pay

## 2021-05-28 MED ORDER — CARESTART COVID-19 HOME TEST VI KIT
PACK | 0 refills | Status: DC
  Filled 2021-05-28: qty 4, 4d supply, fill #0

## 2021-06-05 ENCOUNTER — Other Ambulatory Visit: Payer: Self-pay

## 2021-06-05 ENCOUNTER — Telehealth (INDEPENDENT_AMBULATORY_CARE_PROVIDER_SITE_OTHER): Payer: No Typology Code available for payment source | Admitting: Psychiatry

## 2021-06-05 ENCOUNTER — Encounter: Payer: Self-pay | Admitting: Psychiatry

## 2021-06-05 DIAGNOSIS — F410 Panic disorder [episodic paroxysmal anxiety] without agoraphobia: Secondary | ICD-10-CM | POA: Diagnosis not present

## 2021-06-05 DIAGNOSIS — F40298 Other specified phobia: Secondary | ICD-10-CM | POA: Diagnosis not present

## 2021-06-05 DIAGNOSIS — F900 Attention-deficit hyperactivity disorder, predominantly inattentive type: Secondary | ICD-10-CM

## 2021-06-05 DIAGNOSIS — F3342 Major depressive disorder, recurrent, in full remission: Secondary | ICD-10-CM | POA: Diagnosis not present

## 2021-06-05 MED ORDER — SERTRALINE HCL 25 MG PO TABS
25.0000 mg | ORAL_TABLET | Freq: Every day | ORAL | 0 refills | Status: DC
  Filled 2021-06-05: qty 90, 90d supply, fill #0
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
## 2021-06-09 ENCOUNTER — Other Ambulatory Visit: Payer: Self-pay

## 2021-07-08 ENCOUNTER — Other Ambulatory Visit: Payer: Self-pay

## 2021-07-08 MED ORDER — VYVANSE 10 MG PO CAPS
ORAL_CAPSULE | ORAL | 0 refills | Status: DC
  Filled 2021-07-08: qty 30, 30d supply, fill #0

## 2021-08-05 ENCOUNTER — Other Ambulatory Visit: Payer: Self-pay

## 2021-08-05 MED ORDER — VYVANSE 10 MG PO CAPS
ORAL_CAPSULE | ORAL | 0 refills | Status: DC
  Filled 2021-08-05 – 2021-08-07 (×2): qty 30, 30d supply, fill #0

## 2021-08-06 ENCOUNTER — Other Ambulatory Visit: Payer: Self-pay

## 2021-08-06 ENCOUNTER — Encounter: Payer: Self-pay | Admitting: Psychiatry

## 2021-08-06 ENCOUNTER — Telehealth (INDEPENDENT_AMBULATORY_CARE_PROVIDER_SITE_OTHER): Payer: No Typology Code available for payment source | Admitting: Psychiatry

## 2021-08-06 DIAGNOSIS — F410 Panic disorder [episodic paroxysmal anxiety] without agoraphobia: Secondary | ICD-10-CM

## 2021-08-06 DIAGNOSIS — F900 Attention-deficit hyperactivity disorder, predominantly inattentive type: Secondary | ICD-10-CM | POA: Diagnosis not present

## 2021-08-06 DIAGNOSIS — F40298 Other specified phobia: Secondary | ICD-10-CM | POA: Diagnosis not present

## 2021-08-06 DIAGNOSIS — F3342 Major depressive disorder, recurrent, in full remission: Secondary | ICD-10-CM

## 2021-08-06 NOTE — Progress Notes (Signed)
Virtual Visit via Video Note  I connected with Michele Houston on 08/06/21 at  3:00 PM EDT by a video enabled telemedicine application and verified that I am speaking with the correct person using two identifiers.  Location Provider Location : [facility] Patient Location : work  Participants: Patient , Provider   I discussed the limitations of evaluation and management by telemedicine and the availability of in person appointments. The patient expressed understanding and agreed to proceed.   I discussed the assessment and treatment plan with the patient. The patient was provided an opportunity to ask questions and all were answered. The patient agreed with the plan and demonstrated an understanding of the instructions.   The patient was advised to call back or seek an in-person evaluation if the symptoms worsen or if the condition fails to improve as anticipated.   [facility] MD OP Progress Note  08/06/2021 3:14 PM Michele Houston  MRN:  [id number]  Chief Complaint:  Chief Complaint   Follow-up; Anxiety    HPI: Michele Houston is a 35 year old Caucasian female, employed, lives in [place], has a history of MDD in remission, panic disorder, ADHD, gastroesophageal reflux disease was evaluated by telemedicine today.  Patient today reports overall she is doing well.  Denies any significant anxiety.  Denies any significant depression symptoms.  She reports sleep as good.  She reports appetite is fair.  Patient denies any suicidality, homicidality or perceptual disturbances.  Patient reports she has not had any significant anxiety attacks and she uses the Xanax sporadically only.  She reports work as busy and she is also pretty busy at home since her children are doing several different activities.  Patient however has been coping overall okay.  Denies side effects to sertraline and is compliant on them.  Patient denies any other concerns today.   Visit Diagnosis:    ICD-10-CM   1. Panic  disorder  F41.0     2. 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 MDD (major depressive disorder), recurrent, in full remission ([place])  F33.42       Past Psychiatric History: Reviewed past psychiatric history from progress note on 07/16/2020.  Past trials of medications- Wellbutrin Lexapro-suicidality Zoloft Celexa Prozac-headaches Klonopin Effexor  Past Medical History:  Past Medical History:  Diagnosis Date   Anemia    when pregnant only   GERD (gastroesophageal reflux disease)     Past Surgical History:  Procedure Laterality Date   CHOLECYSTECTOMY N/A 06/03/2015   Procedure: LAPAROSCOPIC CHOLECYSTECTOMY WITH INTRAOPERATIVE CHOLANGIOGRAM;  Surgeon: Robert Bellow, MD;  Location: [facility] ORS;  Service: General;  Laterality: N/A;   KNEE SURGERY Left 2011   arthroscopy   tubes in ears  baby    Family Psychiatric History: Reviewed family psychiatric history from progress note on 07/16/2020  Family History:  Family History  Problem Relation Age of Onset   Hyperthyroidism Mother    Depression Mother    Lung cancer Father    Depression Father    Drug abuse Father    Lung cancer Paternal Uncle    Lung cancer Paternal Grandmother    Cervical cancer Sister    Depression Sister    Hypertension Brother    Bipolar disorder Sister     Social History: Reviewed social history from progress note on 07/16/2020 Social History   Socioeconomic History   Marital status: Married  Spouse name: Not on file   Number of children: 3   Years of education: Not on file   Highest education level: Not on file  Occupational History   Occupation: pharmacy tech    Employer: [employer]  Tobacco Use   Smoking status: Former    Packs/day: 1.00    Years: 4.00    Pack years: 4.00    Types: Cigarettes    Quit date: 05/30/2011    Years since quitting: 10.1   Smokeless tobacco: Never  Vaping Use    Vaping Use: Never used  Substance and Sexual Activity   Alcohol use: Not Currently    Alcohol/week: 0.0 standard drinks   Drug use: Never   Sexual activity: Yes    Birth control/protection: None    Comment: Vasectomy in husband  Other Topics Concern   Not on file  Social History Narrative   Not on file   Social Determinants of Health   Financial Resource Strain: Not on file  Food Insecurity: Not on file  Transportation Needs: Not on file  Physical Activity: Not on file  Stress: Not on file  Social Connections: Not on file    Allergies:  Allergies  Allergen Reactions   Ceclor [Cefaclor] Hives   Septra [Sulfamethoxazole-Trimethoprim] Nausea And Vomiting   Tri-Sprintec [Norgestimate-Eth Estradiol] Other (See Comments)    Headaches, increased anxiety    Metabolic Disorder Labs: Lab Results  Component Value Date   HGBA1C 5.6 05/08/2020   No results found for: PROLACTIN Lab Results  Component Value Date   CHOL 181 05/08/2020   TRIG 64.0 05/08/2020   HDL 81.80 05/08/2020   CHOLHDL 2 05/08/2020   VLDL 12.8 05/08/2020   LDLCALC 86 05/08/2020   LDLCALC 100 (H) 03/29/2019   Lab Results  Component Value Date   TSH 0.92 05/08/2020   TSH 1.14 03/29/2019    Therapeutic Level Labs: No results found for: LITHIUM No results found for: VALPROATE No components found for:  CBMZ  Current Medications: Current Outpatient Medications  Medication Sig Dispense Refill   lisdexamfetamine (VYVANSE) 10 MG capsule 1 capsule by mouth daily 30 capsule 0   ALPRAZolam (XANAX) 0.25 MG tablet Take 1-2 tablets (0.25-0.5 mg total) by mouth daily as needed for anxiety. 26 tablet 0   azithromycin (ZITHROMAX) 250 MG tablet Take 2 tablets by mouth on day 1, then 1 tablet on days 2-5. (Patient not taking: Reported on 06/05/2021) 6 tablet 0   cetirizine (ZYRTEC) 10 MG tablet Take 10 mg by mouth daily.     ciprofloxacin-dexamethasone (CIPRODEX) OTIC suspension Place 4 drops into the left ear 2  (two) times daily for 10 days (Patient not taking: Reported on 06/05/2021) 7.5 mL 0   clindamycin (CLEOCIN-T) 1 % external solution Applyto the vulvar area twice a day as needed. (Patient not taking: Reported on 06/05/2021) 30 mL 1   COVID-19 At Home Antigen Test (CARESTART COVID-19 HOME TEST) KIT Use as directed 4 each 0   dexamethasone (DECADRON) 2 MG tablet Take 2 tablets by mouth daily for 3 days, then 1 tablet daily for 2 days. (Patient not taking: Reported on 06/05/2021) 8 tablet 0   doxycycline (VIBRAMYCIN) 100 MG capsule Take 1 capsule (100 mg total) by mouth 2 (two) times daily for 10 days (Patient not taking: Reported on 06/05/2021) 20 capsule 0   ferrous sulfate 325 (65 FE) MG EC tablet Take 1 tablet (325 mg total) by mouth daily. 30 tablet 1   folic acid (FOLVITE) 1  MG tablet Take 1 tablet (1 mg total) by mouth every 12 (twelve) hours for 5 days. (Patient not taking: Reported on 06/05/2021) 10 tablet 0   hydroxyurea (HYDREA) 500 MG capsule Take 1 capsule (500 mg total) by mouth every 12 (twelve) hours for 5 days. (Patient not taking: Reported on 06/05/2021) 10 capsule 0   lisdexamfetamine (VYVANSE) 20 MG capsule TAKE 1 CAPSULE BY MOUTH ONCE DAILY 30 capsule 0   lisdexamfetamine (VYVANSE) 20 MG capsule TAKE 1 CAPSULE BY MOUTH DAILY 30 capsule 0   lisdexamfetamine (VYVANSE) 20 MG capsule TAKE 1 CAPSULE BY MOUTH DAILY 30 capsule 0   lisdexamfetamine (VYVANSE) 20 MG capsule TAKE 1 CAPSULE BY MOUTH DAILY 30 capsule 0   lisdexamfetamine (VYVANSE) 20 MG capsule TAKE 1 CAPSULE BY MOUTH ONCE DAILY 30 capsule 0   lisdexamfetamine (VYVANSE) 20 MG capsule TAKE 1 CAPSULE BY MOUTH DAILY 30 capsule 0   lisdexamfetamine (VYVANSE) 20 MG capsule Take 1 capsule by mouth daily (Patient not taking: Reported on 08/06/2021) 30 capsule 0   meloxicam (MOBIC) 15 MG tablet Take by mouth.     meloxicam (MOBIC) 15 MG tablet 1 po qday for jaw pain 40 tablet 3   methylPREDNISolone (MEDROL DOSEPAK) 4 MG TBPK tablet Follow package  directions. (Patient not taking: Reported on 06/05/2021) 21 tablet 0   omeprazole (PRILOSEC) 20 MG capsule TAKE 1 CAPSULE (20 MG TOTAL) BY MOUTH DAILY. (Patient not taking: Reported on 06/05/2021) 90 capsule 0   omeprazole (PRILOSEC) 20 MG capsule Take 1 capsule (20 mg total) by mouth once daily (Patient not taking: Reported on 06/05/2021) 90 capsule 1   omeprazole (PRILOSEC) 40 MG capsule Take one pill  a day (Patient not taking: Reported on 06/05/2021) 90 capsule 12   ondansetron (ZOFRAN) 4 MG tablet Take 1 tablet (4 mg total) by mouth every 8 (eight) hours as needed for Nausea for up to 7 days (Patient not taking: Reported on 06/05/2021) 20 tablet 0   ondansetron (ZOFRAN-ODT) 4 MG disintegrating tablet Take 4 mg by mouth every 8 (eight) hours as needed.     sertraline (ZOLOFT) 25 MG tablet Take 1 tablet (25 mg total) by mouth daily with breakfast. 90 tablet 0   VYVANSE 20 MG capsule Take 20 mg by mouth at bedtime. (Patient not taking: Reported on 08/06/2021)     No current facility-administered medications for this visit.     Musculoskeletal: Strength & Muscle Tone:  UTA Gait & Station:  Seated Patient leans: N/A  Psychiatric Specialty Exam: Review of Systems  Psychiatric/Behavioral:  Negative for agitation, behavioral problems, confusion, decreased concentration, dysphoric mood, hallucinations, self-injury, sleep disturbance and suicidal ideas. The patient is not nervous/anxious and is not hyperactive.   All other systems reviewed and are negative.  There were no vitals taken for this visit.There is no height or weight on file to calculate BMI.  General Appearance: Casual  Eye Contact:  Fair  Speech:  Clear and Coherent  Volume:  Normal  Mood:  Euthymic  Affect:  Congruent  Thought Process:  Goal Directed and Descriptions of Associations: Intact  Orientation:  Full (Time, Place, and Person)  Thought Content: Logical   Suicidal Thoughts:  No  Homicidal Thoughts:  No  Memory:  Immediate;    Fair Recent;   Fair Remote;   Fair  Judgement:  Fair  Insight:  Fair  Psychomotor Activity:  Normal  Concentration:  Concentration: Fair and Attention Span: Fair  Recall:  [employer] of Knowledge: Fair  Language: Fair  Akathisia:  No  Handed:  Right  AIMS (if indicated): done  Assets:  Communication Skills Desire for Improvement Resilience Social Support Transportation Vocational/Educational  ADL's:  Intact  Cognition: WNL  Sleep:  Fair   Screenings: GAD-7    Flowsheet Row Video Visit from 06/05/2021 in [facility] Video Visit from 04/22/2021 in [facility] Video Visit from 03/12/2021 in [facility] Video Visit from 12/11/2020 in [facility] from 07/16/2020 in [facility]  Total GAD-7 Score 0 _0 PHQ2-9    Flowsheet Row Video Visit from 06/05/2021 in [facility] Video Visit from 04/22/2021 in [facility] Video Visit from 03/12/2021 in [facility] Video Visit from 12/11/2020 in [facility] Video Visit from 09/12/2020 in [facility]  PHQ-2 Total Score 0 0 1 0 0  PHQ-9 Total Score -- -- -- -- 0      Flowsheet Row Video Visit from 12/11/2020 in [facility] No Risk        Assessment and Plan: Michele Houston is a 35 year old Caucasian female, employed, lives in [place], has a history of panic disorder, ADHD, MDD in remission was evaluated by telemedicine today.  Patient is currently stable.  Plan as noted below.  Plan Panic disorder-stable Zoloft 25 mg p.o. daily with breakfast Xanax 0.25 mg p.o. daily as needed EAP counseling - Ms.Kerri Miller AIMS -0   ADHD-stable Currently on Vyvanse 10 mg p.o. daily-prescribed by [place]  attention specialist  Specific phobia-flying-improving Xanax 0.25 mg as needed prior to a flight CBT as needed  Follow-up in clinic in 4 months or sooner if needed.  This note was generated in part or whole with voice recognition software. Voice recognition is usually quite accurate but there are transcription errors that can and very often do occur. I apologize for any typographical errors that were not detected and corrected.     Ursula Alert, MD 08/07/2021, 8:46 AM

## 2021-08-07 ENCOUNTER — Other Ambulatory Visit: Payer: Self-pay

## 2021-08-21 ENCOUNTER — Other Ambulatory Visit: Payer: Self-pay

## 2021-09-01 ENCOUNTER — Other Ambulatory Visit: Payer: Self-pay

## 2021-09-01 MED ORDER — VYVANSE 20 MG PO CAPS
ORAL_CAPSULE | ORAL | 0 refills | Status: DC
  Filled 2021-09-01: qty 30, 30d supply, fill #0

## 2021-09-03 ENCOUNTER — Other Ambulatory Visit: Payer: Self-pay

## 2021-09-08 ENCOUNTER — Other Ambulatory Visit: Payer: Self-pay | Admitting: Psychiatry

## 2021-09-08 ENCOUNTER — Other Ambulatory Visit: Payer: Self-pay

## 2021-09-08 DIAGNOSIS — F410 Panic disorder [episodic paroxysmal anxiety] without agoraphobia: Secondary | ICD-10-CM

## 2021-09-08 MED FILL — Sertraline HCl Tab 25 MG: ORAL | 90 days supply | Qty: 90 | Fill #0 | Status: AC

## 2021-10-02 ENCOUNTER — Other Ambulatory Visit: Payer: Self-pay

## 2021-10-02 MED ORDER — VYVANSE 20 MG PO CAPS
ORAL_CAPSULE | ORAL | 0 refills | Status: DC
  Filled 2021-10-02: qty 30, 30d supply, fill #0

## 2021-10-07 ENCOUNTER — Other Ambulatory Visit: Payer: Self-pay

## 2021-10-07 MED ORDER — AZITHROMYCIN 250 MG PO TABS
ORAL_TABLET | ORAL | 0 refills | Status: DC
  Filled 2021-10-07: qty 6, 5d supply, fill #0

## 2021-10-11 ENCOUNTER — Telehealth: Payer: No Typology Code available for payment source | Admitting: Nurse Practitioner

## 2021-10-11 DIAGNOSIS — J329 Chronic sinusitis, unspecified: Secondary | ICD-10-CM | POA: Diagnosis not present

## 2021-10-11 MED ORDER — DOXYCYCLINE HYCLATE 100 MG PO TABS: 100.0000 mg | ORAL_TABLET | Freq: Two times a day (BID) | ORAL | 0 refills | Status: AC

## 2021-10-11 NOTE — Progress Notes (Signed)

## 2021-10-12 ENCOUNTER — Other Ambulatory Visit: Payer: Self-pay

## 2021-10-12 MED ORDER — AMOXICILLIN-POT CLAVULANATE 875-125 MG PO TABS
ORAL_TABLET | ORAL | 0 refills | Status: DC
  Filled 2021-10-12: qty 20, 10d supply, fill #0

## 2021-10-22 ENCOUNTER — Other Ambulatory Visit: Payer: Self-pay

## 2021-11-19 ENCOUNTER — Other Ambulatory Visit: Payer: Self-pay

## 2021-11-19 MED ORDER — VYVANSE 20 MG PO CAPS
ORAL_CAPSULE | ORAL | 0 refills | Status: DC
  Filled 2021-11-19: qty 30, 30d supply, fill #0

## 2021-12-03 ENCOUNTER — Encounter: Payer: Self-pay | Admitting: Psychiatry

## 2021-12-03 ENCOUNTER — Telehealth (INDEPENDENT_AMBULATORY_CARE_PROVIDER_SITE_OTHER): Payer: No Typology Code available for payment source | Admitting: Psychiatry

## 2021-12-03 ENCOUNTER — Other Ambulatory Visit: Payer: Self-pay

## 2021-12-03 DIAGNOSIS — F3342 Major depressive disorder, recurrent, in full remission: Secondary | ICD-10-CM

## 2021-12-03 DIAGNOSIS — F900 Attention-deficit hyperactivity disorder, predominantly inattentive type: Secondary | ICD-10-CM

## 2021-12-03 DIAGNOSIS — F410 Panic disorder [episodic paroxysmal anxiety] without agoraphobia: Secondary | ICD-10-CM | POA: Diagnosis not present

## 2021-12-03 DIAGNOSIS — F40298 Other specified phobia: Secondary | ICD-10-CM

## 2021-12-03 MED ORDER — SERTRALINE HCL 25 MG PO TABS
25.0000 mg | ORAL_TABLET | Freq: Every day | ORAL | 1 refills | Status: DC
Start: 2021-12-03 — End: 2022-03-25
  Filled 2021-12-03 (×2): qty 135, 90d supply, fill #0

## 2021-12-03 MED ORDER — ALPRAZOLAM 0.25 MG PO TABS
0.2500 mg | ORAL_TABLET | Freq: Every day | ORAL | 0 refills | Status: DC | PRN
  Filled 2021-12-03: qty 26, 13d supply, fill #0

## 2021-12-03 NOTE — Progress Notes (Signed)
Virtual Visit via Video Note  I connected with Michele Houston on 12/03/21 at  3:00 PM EST by a video enabled telemedicine application and verified that I am speaking with the correct person using two identifiers.  Location Provider Location : [facility] Patient Location : Work  Participants: Patient , Provider   I discussed the limitations of evaluation and management by telemedicine and the availability of in person appointments. The patient expressed understanding and agreed to proceed.    I discussed the assessment and treatment plan with the patient. The patient was provided an opportunity to ask questions and all were answered. The patient agreed with the plan and demonstrated an understanding of the instructions.   The patient was advised to call back or seek an in-person evaluation if the symptoms worsen or if the condition fails to improve as anticipated.                                               [facility] MD OP Progress Note  12/03/2021 3:01 PM Michele Houston  MRN:  [id number]  Chief Complaint:  Chief Complaint   Follow-up 36 year old Caucasian female who has a history of MDD in remission, panic disorder, ADHD was evaluated for medication management.    HPI: Michele Houston is a 36 year old Caucasian female, employed, lives in [place], has a history of MDD in remission, panic disorder, ADHD, gastroesophageal reflux disease was evaluated by telemedicine today.  Patient today reports she is currently overall doing well.  She however reports since it was games season for her children it has been extremely stressful for her.  She however has not had any panic attacks.  She is interested in increasing the dosage of sertraline however wonders whether it will give her sexual side effects again.  Patient rarely uses Xanax.  However that does help when she has significant panic attacks.  Reports sleep and appetite as fair.  Denies suicidality, homicidality or perceptual  disturbances.  Patient reports work as going well.  She reports she she is going to get a promotion and has been excited about that.  Patient denies any other concerns today.  Visit Diagnosis:    ICD-10-CM   1. 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 MDD (major depressive disorder), recurrent, in full remission ([place])  F33.42     4. Attention deficit hyperactivity disorder (ADHD), predominantly inattentive type  F90.0       Past Psychiatric History: Reviewed past psychiatric history from progress note on 07/16/2020.  Past trials of medications- Wellbutrin Lexapro-suicidality Zoloft Celexa Prozac-headaches Klonopin Effexor   Past Medical History:  Past Medical History:  Diagnosis Date   Anemia    when pregnant only   GERD (gastroesophageal reflux disease)     Past Surgical History:  Procedure Laterality Date   CHOLECYSTECTOMY N/A 06/03/2015   Procedure: LAPAROSCOPIC CHOLECYSTECTOMY WITH INTRAOPERATIVE CHOLANGIOGRAM;  Surgeon: Robert Bellow, MD;  Location: [facility] ORS;  Service: General;  Laterality: N/A;   KNEE SURGERY Left 2011   arthroscopy   tubes in ears  baby    Family Psychiatric History: Reviewed family psychiatric history from progress note on 07/16/2020.  Family History:  Family History  Problem Relation Age of Onset   Hyperthyroidism Mother  Depression Mother    Lung cancer Father    Depression Father    Drug abuse Father    Lung cancer Paternal Uncle    Lung cancer Paternal Grandmother    Cervical cancer Sister    Depression Sister    Hypertension Brother    Bipolar disorder Sister     Social History: Reviewed social history from progress note on 07/16/2020. Social History   Socioeconomic History   Marital status: Married    Spouse name: Not on file   Number of children: 3   Years of education: Not on file   Highest education level: Not on file   Occupational History   Occupation: pharmacy tech    Employer: [employer]  Tobacco Use   Smoking status: Former    Packs/day: 1.00    Years: 4.00    Pack years: 4.00    Types: Cigarettes    Quit date: 05/30/2011    Years since quitting: 10.5   Smokeless tobacco: Never  Vaping Use   Vaping Use: Never used  Substance and Sexual Activity   Alcohol use: Not Currently    Alcohol/week: 0.0 standard drinks   Drug use: Never   Sexual activity: Yes    Birth control/protection: None    Comment: Vasectomy in husband  Other Topics Concern   Not on file  Social History Narrative   Not on file   Social Determinants of Health   Financial Resource Strain: Not on file  Food Insecurity: Not on file  Transportation Needs: Not on file  Physical Activity: Not on file  Stress: Not on file  Social Connections: Not on file    Allergies:  Allergies  Allergen Reactions   Ceclor [Cefaclor] Hives   Septra [Sulfamethoxazole-Trimethoprim] Nausea And Vomiting   Tri-Sprintec [Norgestimate-Eth Estradiol] Other (See Comments)    Headaches, increased anxiety    Metabolic Disorder Labs: Lab Results  Component Value Date   HGBA1C 5.6 05/08/2020   No results found for: PROLACTIN Lab Results  Component Value Date   CHOL 181 05/08/2020   TRIG 64.0 05/08/2020   HDL 81.80 05/08/2020   CHOLHDL 2 05/08/2020   VLDL 12.8 05/08/2020   LDLCALC 86 05/08/2020   LDLCALC 100 (H) 03/29/2019   Lab Results  Component Value Date   TSH 0.92 05/08/2020   TSH 1.14 03/29/2019    Therapeutic Level Labs: No results found for: LITHIUM No results found for: VALPROATE No components found for:  CBMZ  Current Medications: Current Outpatient Medications  Medication Sig Dispense Refill   ALPRAZolam (XANAX) 0.25 MG tablet Take 1-2 tablets (0.25-0.5 mg total) by mouth daily as needed for anxiety. 26 tablet 0   amoxicillin-clavulanate (AUGMENTIN) 875-125 MG tablet Take 1 tablet (875 mg total) by mouth every 12  (twelve) hours for 10 days 20 tablet 0   azithromycin (ZITHROMAX) 250 MG tablet Take 2 tablets by mouth on day 1, then 1 tablet on days 2-5. (Patient not taking: Reported on 06/05/2021) 6 tablet 0   azithromycin (ZITHROMAX) 250 MG tablet Take 2 tablets (51m) by mouth on Day 1. Take 1 tablet (2580m by mouth on Days 2-5. 6 tablet 0   cetirizine (ZYRTEC) 10 MG tablet Take 10 mg by mouth daily.     ciprofloxacin-dexamethasone (CIPRODEX) OTIC suspension Place 4 drops into the left ear 2 (two) times daily for 10 days (Patient not taking: Reported on 06/05/2021) 7.5 mL 0   clindamycin (CLEOCIN-T) 1 % external solution Applyto the vulvar area twice a  day as needed. (Patient not taking: Reported on 06/05/2021) 30 mL 1   COVID-19 At Home Antigen Test (CARESTART COVID-19 HOME TEST) KIT Use as directed 4 each 0   dexamethasone (DECADRON) 2 MG tablet Take 2 tablets by mouth daily for 3 days, then 1 tablet daily for 2 days. (Patient not taking: Reported on 06/05/2021) 8 tablet 0   doxycycline (VIBRAMYCIN) 100 MG capsule Take 1 capsule (100 mg total) by mouth 2 (two) times daily for 10 days (Patient not taking: Reported on 06/05/2021) 20 capsule 0   ferrous sulfate 325 (65 FE) MG EC tablet Take 1 tablet (325 mg total) by mouth daily. 30 tablet 1   folic acid (FOLVITE) 1 MG tablet Take 1 tablet (1 mg total) by mouth every 12 (twelve) hours for 5 days. (Patient not taking: Reported on 06/05/2021) 10 tablet 0   hydroxyurea (HYDREA) 500 MG capsule Take 1 capsule (500 mg total) by mouth every 12 (twelve) hours for 5 days. (Patient not taking: Reported on 06/05/2021) 10 capsule 0   lisdexamfetamine (VYVANSE) 10 MG capsule 1 capsule by mouth daily 30 capsule 0   lisdexamfetamine (VYVANSE) 20 MG capsule TAKE 1 CAPSULE BY MOUTH ONCE DAILY 30 capsule 0   lisdexamfetamine (VYVANSE) 20 MG capsule TAKE 1 CAPSULE BY MOUTH DAILY 30 capsule 0   lisdexamfetamine (VYVANSE) 20 MG capsule TAKE 1 CAPSULE BY MOUTH DAILY 30 capsule 0    lisdexamfetamine (VYVANSE) 20 MG capsule TAKE 1 CAPSULE BY MOUTH DAILY 30 capsule 0   lisdexamfetamine (VYVANSE) 20 MG capsule TAKE 1 CAPSULE BY MOUTH ONCE DAILY 30 capsule 0   lisdexamfetamine (VYVANSE) 20 MG capsule TAKE 1 CAPSULE BY MOUTH DAILY 30 capsule 0   lisdexamfetamine (VYVANSE) 20 MG capsule Take 1 capsule by mouth daily (Patient not taking: Reported on 08/06/2021) 30 capsule 0   lisdexamfetamine (VYVANSE) 20 MG capsule Take 1 capsule by mouth daily 30 capsule 0   meloxicam (MOBIC) 15 MG tablet Take by mouth.     meloxicam (MOBIC) 15 MG tablet 1 po qday for jaw pain 40 tablet 3   methylPREDNISolone (MEDROL DOSEPAK) 4 MG TBPK tablet Follow package directions. (Patient not taking: Reported on 06/05/2021) 21 tablet 0   omeprazole (PRILOSEC) 40 MG capsule Take one pill  a day (Patient not taking: Reported on 06/05/2021) 90 capsule 12   ondansetron (ZOFRAN) 4 MG tablet Take 1 tablet (4 mg total) by mouth every 8 (eight) hours as needed for Nausea for up to 7 days (Patient not taking: Reported on 06/05/2021) 20 tablet 0   ondansetron (ZOFRAN-ODT) 4 MG disintegrating tablet Take 4 mg by mouth every 8 (eight) hours as needed.     sertraline (ZOLOFT) 25 MG tablet Take 1-1.5 tablets (25-37.5 mg total) by mouth daily with breakfast. 135 tablet 1   VYVANSE 20 MG capsule Take 20 mg by mouth at bedtime. (Patient not taking: Reported on 08/06/2021)     No current facility-administered medications for this visit.     Musculoskeletal: Strength & Muscle Tone:  UTA Gait & Station:  UTA Patient leans: N/A  Psychiatric Specialty Exam: Review of Systems  Psychiatric/Behavioral:  The patient is nervous/anxious.   All other systems reviewed and are negative.  There were no vitals taken for this visit.There is no height or weight on file to calculate BMI.  General Appearance: Casual  Eye Contact:  Fair  Speech:  Clear and Coherent  Volume:  Normal  Mood:  Anxious  Affect:  Congruent  Thought  Process:   Goal Directed and Descriptions of Associations: Intact  Orientation:  Full (Time, Place, and Person)  Thought Content: Logical   Suicidal Thoughts:  No  Homicidal Thoughts:  No  Memory:  Immediate;   Fair Recent;   Fair Remote;   Fair  Judgement:  Fair  Insight:  Fair  Psychomotor Activity:  Normal  Concentration:  Concentration: Fair and Attention Span: Fair  Recall:  [employer] of Knowledge: Fair  Language: Fair  Akathisia:  No  Handed:  Right  AIMS (if indicated): not done  Assets:  Communication Skills Desire for [place] Talents/Skills Transportation  ADL's:  Intact  Cognition: WNL  Sleep:  Fair   Screenings: GAD-7    Flowsheet Row Video Visit from 12/03/2021 in [place] Video Visit from 06/05/2021 in [facility] Video Visit from 04/22/2021 in [facility] Video Visit from 03/12/2021 in [place] Video Visit from 12/11/2020 in [place]  Total GAD-7 Score 1 0 '1 1 3      ' PHQ2-9    Flowsheet Row Video Visit from 12/03/2021 in [place] Video Visit from 06/05/2021 in [facility] Video Visit from 04/22/2021 in [place] Video Visit from 03/12/2021 in [place] Video Visit from 12/11/2020 in [place]  PHQ-2 Total Score 0 0 0 1 0      Flowsheet Row Video Visit from 12/11/2020 in [facility] [place] No Risk        Assessment and Plan: Michele Houston is a 36 year old Caucasian female, employed, lives in [place], has a history of panic disorder, ADHD, MDD in remission was evaluated by telemedicine today.  Patient with mild anxiety symptoms is interested in restarting psychotherapy sessions as well as dosage  increase of sertraline.  Plan Panic disorder-stable Since patient does have episodic anxiety we will consider increasing Zoloft to 37.5 mg p.o. daily.  However discussed with patient she could reduce it back to Zoloft 25 mg p.o. daily with breakfast if she notices sexual side effects. Xanax 0.25 mg p.o. daily as needed Will refer for CBT with our therapist.  I have sent communication to front desk.   Specific phobia-flying-stable Xanax 0.25 mg as needed prior to her flight  MDD in remission Will monitor closely  ADHD-stable Vyvanse 10 mg p.o. daily-currently prescribed by [place] attention specialist.  Follow-up in clinic in 4 months or sooner if needed.  This note was generated in part or whole with voice recognition software. Voice recognition is usually quite accurate but there are transcription errors that can and very often do occur. I apologize for any typographical errors that were not detected and corrected.      Ursula Alert, MD 12/03/2021, 3:01 PM

## 2021-12-04 ENCOUNTER — Other Ambulatory Visit: Payer: Self-pay

## 2021-12-23 ENCOUNTER — Other Ambulatory Visit: Payer: Self-pay

## 2021-12-23 MED ORDER — VYVANSE 20 MG PO CAPS
ORAL_CAPSULE | ORAL | 0 refills | Status: DC
  Filled 2021-12-23: qty 30, 30d supply, fill #0

## 2021-12-29 ENCOUNTER — Ambulatory Visit (INDEPENDENT_AMBULATORY_CARE_PROVIDER_SITE_OTHER): Payer: No Typology Code available for payment source | Admitting: Licensed Clinical Social Worker

## 2021-12-29 DIAGNOSIS — F40298 Other specified phobia: Secondary | ICD-10-CM

## 2021-12-29 DIAGNOSIS — F410 Panic disorder [episodic paroxysmal anxiety] without agoraphobia: Secondary | ICD-10-CM | POA: Diagnosis not present

## 2021-12-29 DIAGNOSIS — F900 Attention-deficit hyperactivity disorder, predominantly inattentive type: Secondary | ICD-10-CM | POA: Diagnosis not present

## 2021-12-29 DIAGNOSIS — F3342 Major depressive disorder, recurrent, in full remission: Secondary | ICD-10-CM
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
## 2022-02-16 ENCOUNTER — Other Ambulatory Visit: Payer: Self-pay

## 2022-03-03 ENCOUNTER — Other Ambulatory Visit: Payer: Self-pay

## 2022-03-03 ENCOUNTER — Telehealth: Payer: No Typology Code available for payment source | Admitting: Physician Assistant

## 2022-03-03 DIAGNOSIS — R3989 Other symptoms and signs involving the genitourinary system: Secondary | ICD-10-CM | POA: Diagnosis not present

## 2022-03-03 MED ORDER — NITROFURANTOIN MONOHYD MACRO 100 MG PO CAPS
100.0000 mg | ORAL_CAPSULE | Freq: Two times a day (BID) | ORAL | 0 refills | Status: DC
  Filled 2022-03-03: qty 10, 5d supply, fill #0

## 2022-03-03 NOTE — Progress Notes (Signed)

## 2022-03-23 ENCOUNTER — Other Ambulatory Visit: Payer: Self-pay

## 2022-03-23 MED ORDER — MELOXICAM 7.5 MG PO TABS
ORAL_TABLET | ORAL | 0 refills | Status: DC
  Filled 2022-03-23: qty 30, 30d supply, fill #0

## 2022-03-25 ENCOUNTER — Telehealth (INDEPENDENT_AMBULATORY_CARE_PROVIDER_SITE_OTHER): Payer: No Typology Code available for payment source | Admitting: Psychiatry

## 2022-03-25 ENCOUNTER — Other Ambulatory Visit: Payer: Self-pay

## 2022-03-25 ENCOUNTER — Encounter: Payer: Self-pay | Admitting: Psychiatry

## 2022-03-25 DIAGNOSIS — F900 Attention-deficit hyperactivity disorder, predominantly inattentive type: Secondary | ICD-10-CM

## 2022-03-25 DIAGNOSIS — F40298 Other specified phobia: Secondary | ICD-10-CM

## 2022-03-25 DIAGNOSIS — F410 Panic disorder [episodic paroxysmal anxiety] without agoraphobia: Secondary | ICD-10-CM | POA: Diagnosis not present

## 2022-03-25 DIAGNOSIS — F3342 Major depressive disorder, recurrent, in full remission: Secondary | ICD-10-CM | POA: Diagnosis not present

## 2022-03-25 MED ORDER — SERTRALINE HCL 25 MG PO TABS
25.0000 mg | ORAL_TABLET | Freq: Every day | ORAL | 1 refills | Status: DC
  Filled 2022-03-25 – 2022-07-06 (×2): qty 90, 90d supply, fill #0

## 2022-03-25 NOTE — Progress Notes (Signed)
Virtual Visit via Video Note  I connected with Michele Houston on 03/25/22 at  2:40 PM EDT by a video enabled telemedicine application and verified that I am speaking with the correct person using two identifiers.  Location Provider Location : [facility] Patient Location : Work  Participants: Patient , Provider   I discussed the limitations of evaluation and management by telemedicine and the availability of in person appointments. The patient expressed understanding and agreed to proceed.   I discussed the assessment and treatment plan with the patient. The patient was provided an opportunity to ask questions and all were answered. The patient agreed with the plan and demonstrated an understanding of the instructions.   The patient was advised to call back or seek an in-person evaluation if the symptoms worsen or if the condition fails to improve as anticipated.   BH MD OP Progress Note  03/25/2022 9:48 PM Michele Houston  MRN:  [id number]  Chief Complaint:  Chief Complaint  Patient presents with   Follow-up: 36 year old Caucasian female who has a history of MDD in remission, panic disorder, ADHD was evaluated for medication management.   HPI: Michele Houston is a 36 year old Caucasian female, employed, lives in [place], has a history of MDD in remission, panic disorder, gastroesophageal reflux disease, ADHD was evaluated by telemedicine today.  Patient today reports she had multiple psychosocial stressors recently when she had job-related stressors, as well as health problems-foot pain and a UTI.  Patient however reports she is currently feeling much better.  She reports she was able to get a promotion at work.  Excited about that.  Patient reports she stopped taking her Vyvanse since she felt it was making her more anxious.  She has been coping with her ADHD symptoms okay, it does not affect her functioning at this time.  Overall anxiety is under control.  Currently compliant on  sertraline.  Denies side effects.  Patient denies any suicidality, homicidality or perceptual disturbances.  Denies any other concerns today.  Visit Diagnosis:    ICD-10-CM   1. 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 MDD (major depressive disorder), recurrent, in full remission (HCC)  F33.42     4. Attention deficit hyperactivity disorder (ADHD), predominantly inattentive type  F90.0       Past Psychiatric History: Reviewed past psychiatric history from progress note on 07/16/2020.  Past trials of medications - Wellbutrin Lexapro-suicidality Zoloft Celexa Prozac-headaches Klonopin Effexor.  Past Medical History:  Past Medical History:  Diagnosis Date   Anemia    when pregnant only   GERD (gastroesophageal reflux disease)     Past Surgical History:  Procedure Laterality Date   CHOLECYSTECTOMY N/A 06/03/2015   Procedure: LAPAROSCOPIC CHOLECYSTECTOMY WITH INTRAOPERATIVE CHOLANGIOGRAM;  Surgeon: Earline Mayotte, MD;  Location: [facility] ORS;  Service: General;  Laterality: N/A;   KNEE SURGERY Left 2011   arthroscopy   tubes in ears  baby    Family Psychiatric History: Reviewed family psychiatric history from progress note on 07/16/2020.  Family History:  Family History  Problem Relation Age of Onset   Hyperthyroidism Mother    Depression Mother    Lung cancer Father    Depression Father    Drug abuse Father    Lung cancer Paternal Uncle    Lung cancer Paternal Grandmother    Cervical cancer Sister    Depression Sister  Hypertension Brother    Bipolar disorder Sister     Social History: Reviewed social history from progress note on 07/16/2020. Social History   Socioeconomic History   Marital status: Married    Spouse name: Not on file   Number of children: 3   Years of education: Not on file   Highest education level: Not on file  Occupational History   Occupation: pharmacy tech    Employer:  [employer]  Tobacco Use   Smoking status: Former    Packs/day: 1.00    Years: 4.00    Pack years: 4.00    Types: Cigarettes    Quit date: 05/30/2011    Years since quitting: 10.8   Smokeless tobacco: Never  Vaping Use   Vaping Use: Never used  Substance and Sexual Activity   Alcohol use: Not Currently    Alcohol/week: 0.0 standard drinks   Drug use: Never   Sexual activity: Yes    Birth control/protection: None    Comment: Vasectomy in husband  Other Topics Concern   Not on file  Social History Narrative   Not on file   Social Determinants of Health   Financial Resource Strain: Not on file  Food Insecurity: Not on file  Transportation Needs: Not on file  Physical Activity: Not on file  Stress: Not on file  Social Connections: Not on file    Allergies:  Allergies  Allergen Reactions   Ceclor [Cefaclor] Hives   Septra [Sulfamethoxazole-Trimethoprim] Nausea And Vomiting   Tri-Sprintec [Norgestimate-Eth Estradiol] Other (See Comments)    Headaches, increased anxiety    Metabolic Disorder Labs: Lab Results  Component Value Date   HGBA1C 5.6 05/08/2020   No results found for: PROLACTIN Lab Results  Component Value Date   CHOL 181 05/08/2020   TRIG 64.0 05/08/2020   HDL 81.80 05/08/2020   CHOLHDL 2 05/08/2020   VLDL 12.8 05/08/2020   LDLCALC 86 05/08/2020   LDLCALC 100 (H) 03/29/2019   Lab Results  Component Value Date   TSH 0.92 05/08/2020   TSH 1.14 03/29/2019    Therapeutic Level Labs: No results found for: LITHIUM No results found for: VALPROATE No components found for:  CBMZ  Current Medications: Current Outpatient Medications  Medication Sig Dispense Refill   ALPRAZolam (XANAX) 0.25 MG tablet Take 1-2 tablets (0.25-0.5 mg total) by mouth daily as needed for anxiety. 26 tablet 0   cetirizine (ZYRTEC) 10 MG tablet Take 10 mg by mouth daily.     ferrous sulfate 325 (65 FE) MG EC tablet Take 1 tablet (325 mg total) by mouth daily. 30 tablet 1    meloxicam (MOBIC) 7.5 MG tablet Take 1 tablet (7.5 mg total) by mouth once daily for 30 days 30 tablet 0   nitrofurantoin, macrocrystal-monohydrate, (MACROBID) 100 MG capsule Take 1 capsule (100 mg total) by mouth 2 (two) times daily. 10 capsule 0   lisdexamfetamine (VYVANSE) 20 MG capsule 1 capsule by mouth daily (Patient not taking: Reported on 03/25/2022) 30 capsule 0   sertraline (ZOLOFT) 25 MG tablet Take 1 tablet (25 mg total) by mouth daily with breakfast. 90 tablet 1   No current facility-administered medications for this visit.     Musculoskeletal: Strength & Muscle Tone:  UTA Gait & Station: normal Patient leans: N/A  Psychiatric Specialty Exam: Review of Systems  Psychiatric/Behavioral:  The patient is nervous/anxious.   All other systems reviewed and are negative.  There were no vitals taken for this visit.There is no height or  weight on file to calculate BMI.  General Appearance: Casual  Eye Contact:  Fair  Speech:  Clear and Coherent  Volume:  Normal  Mood:  Anxious coping well  Affect:  Congruent  Thought Process:  Goal Directed and Descriptions of Associations: Intact  Orientation:  Full (Time, Place, and Person)  Thought Content: Logical   Suicidal Thoughts:  No  Homicidal Thoughts:  No  Memory:  Immediate;   Fair Recent;   Fair Remote;   Fair  Judgement:  Fair  Insight:  Fair  Psychomotor Activity:  Normal  Concentration:  Concentration: Fair and Attention Span: Fair  Recall:  [employer]  Fund of Knowledge: Fair  Language: Fair  Akathisia:  No  Handed:  Right  AIMS (if indicated): [place]  Assets:  Communication Skills Desire for Improvement Housing Intimacy Social Support Talents/Skills  ADL's:  Intact  Cognition: WNL  Sleep:  Fair   Screenings: AIMS    Flowsheet Row [facility]  AIMS Total Score 0      GAD-7    Flowsheet Row Video Visit [facility]20[facility] Video Visit from [facility]2021 in [facility] Video Visit from 06/05/2021 [facility] Video Visit from [facility]/22[facility]2021 [facility] Video Visit from 03/12/2021 in [facility]  Total GAD-7 Score 0 1 0 1 1      PHQ2-9    Flowsheet Row Video Visit from 03/25/2022 in [facility] Counselor from 12/29/2021 in [facility] [place] Video Visit from 12[facility]03/2021 in [facility] Video Visit from 06/05/2021 [facility] Video Visit from 04/22/2021 [facility]  PHQ-2 Total Score 0 0 0 0 0      Flowsheet Row Video Visit from 03/25/2022 [facility] Counselor from 12/29/2021 in [facility] [place] Video Visit from 12/11/2020 in [facility]  C-SSRS RISK CATEGORY No Risk No Risk No Risk        Assessment and Plan: Michele Houston is a 36 year old Caucasian female, employed, lives in [place], has a history of panic disorder, ADHD, MDD in remission was evaluated by telemedicine today.  Patient is currently stable.  Plan Panic disorder-stable Zoloft 25 mg p.o. daily.  She did not increase it to 37.5 mg as discussed last visit. Xanax 0.25 mg p.o. daily as needed Continue CBT with therapist as needed.  Specific phobia-flying-stable Xanax 0.25 mg as needed prior to her flight.  MDD in remission Will monitor closely  ADHD-stable Currently not taking Vyvanse.   Follow-up in clinic in 4 months or sooner if needed.  Collaboration of Care: Collaboration of Care: Referral or follow-up with counselor/therapist AEB encouraged to reach out to therapist for an appointment.  Patient/Guardian was advised Release of Information must be obtained prior to any record  release in order to collaborate their care with an outside provider. Patient/Guardian was advised if they have not already Houston so to contact the registration department to sign all necessary forms in order for us to release information regarding their care.   Consent: Patient/Guardian gives verbal consent for treatment and assignment of benefits for services provided during this visit. Patient/Guardian expressed understanding and agreed to proceed.   This note was generated in part or whole with voice recognition software. Voice recognition is usually quite accurate but there are transcription errors that can  and very often do occur. I apologize for any typographical errors that were not detected and corrected.      Jomarie Longs, MD 03/25/2022, 9:48 PM

## 2022-03-30 ENCOUNTER — Ambulatory Visit (HOSPITAL_COMMUNITY): Payer: No Typology Code available for payment source | Admitting: Licensed Clinical Social Worker

## 2022-03-30 ENCOUNTER — Other Ambulatory Visit: Payer: Self-pay

## 2022-03-30 MED ORDER — DOXYCYCLINE HYCLATE 100 MG PO CAPS
ORAL_CAPSULE | ORAL | 0 refills | Status: DC
  Filled 2022-03-30: qty 20, 10d supply, fill #0

## 2022-03-30 MED ORDER — WEGOVY 0.25 MG/0.5ML ~~LOC~~ SOAJ
SUBCUTANEOUS | 1 refills | Status: DC
  Filled 2022-05-18: qty 2, 28d supply, fill #0
  Filled 2022-06-09: qty 2, 28d supply, fill #1

## 2022-03-30 MED ORDER — BUPROPION HCL ER (XL) 150 MG PO TB24
ORAL_TABLET | ORAL | 11 refills | Status: DC
  Filled 2022-03-30: qty 30, 30d supply, fill #0

## 2022-03-30 MED ORDER — SCOPOLAMINE 1 MG/3DAYS TD PT72
MEDICATED_PATCH | TRANSDERMAL | 0 refills | Status: DC
  Filled 2022-03-30: qty 10, 30d supply, fill #0

## 2022-03-31 ENCOUNTER — Other Ambulatory Visit: Payer: Self-pay

## 2022-04-08 ENCOUNTER — Other Ambulatory Visit: Payer: Self-pay

## 2022-04-16 ENCOUNTER — Other Ambulatory Visit: Payer: Self-pay

## 2022-05-05 ENCOUNTER — Telehealth: Payer: No Typology Code available for payment source | Admitting: Nurse Practitioner

## 2022-05-05 ENCOUNTER — Other Ambulatory Visit: Payer: Self-pay

## 2022-05-05 DIAGNOSIS — R21 Rash and other nonspecific skin eruption: Secondary | ICD-10-CM | POA: Diagnosis not present

## 2022-05-05 MED ORDER — PREDNISONE 10 MG PO TABS
ORAL_TABLET | ORAL | 0 refills | Status: DC
  Filled 2022-05-05: qty 37, 14d supply, fill #0

## 2022-05-05 NOTE — Progress Notes (Signed)
E Visit for Rash  We are sorry that you are not feeling well. Here is how we plan to help!  I am prescribing a two week course of steroids (37 tablets of 10 mg prednisone).  Days 1-4 take 4 tablets (40 mg) daily  Days 5-8 take 3 tablets (30 mg) daily, Days 9-11 take 2 tablets (20 mg) daily, Days 12-14 take 1 tablet (10 mg) daily.    HOME CARE:  Take cool showers and avoid direct sunlight. Apply cool compress or wet dressings. Take a bath in an oatmeal bath.  Sprinkle content of one Aveeno packet under running faucet with comfortably warm water.  Bathe for 15-20 minutes, 1-2 times daily.  Pat dry with a towel. Do not rub the rash. Use hydrocortisone cream. Take an antihistamine like Benadryl for widespread rashes that itch.  The adult dose of Benadryl is 25-50 mg by mouth 4 times daily. Caution:  This type of medication may cause sleepiness.  Do not drink alcohol, drive, or operate dangerous machinery while taking antihistamines.  Do not take these medications if you have prostate enlargement.  Read package instructions thoroughly on all medications that you take.  GET HELP RIGHT AWAY IF:  Symptoms don't go away after treatment. Severe itching that persists. If you rash spreads or swells. If you rash begins to smell. If it blisters and opens or develops a yellow-brown crust. You develop a fever. You have a sore throat. You become short of breath.  MAKE SURE YOU:  Understand these instructions. Will watch your condition. Will get help right away if you are not doing well or get worse.  Thank you for choosing an e-visit.  Your e-visit answers were reviewed by a board certified advanced clinical practitioner to complete your personal care plan. Depending upon the condition, your plan could have included both over the counter or prescription medications.  Please review your pharmacy choice. Make sure the pharmacy is open so you can pick up prescription now. If there is a problem, you  may contact your provider through [employer] and have the prescription routed to another pharmacy.  Your safety is important to [place]. If you have drug allergies check your prescription carefully.   For the next 24 hours you can use MyChart to ask questions about today's visit, request a non-urgent call back, or ask for a work or school excuse. You will get an email in the next two days asking about your experience. I hope that your e-visit has been valuable and will speed your recovery.   I spent approximately 7 minutes reviewing the patient's history, current symptoms and coordinating their plan of care today.    Meds ordered this encounter  Medications   predniSONE (DELTASONE) 10 MG tablet    Sig: Take 4 tablets (40mg ) on days 1-4, then 3 tablets (30mg ) on days 5-8, then 2 tablets (20mg ) on days 9-11, then 1 tablet daily for days 12-14. Take with food.    Dispense:  37 tablet    Refill:  0

## 2022-05-06 ENCOUNTER — Other Ambulatory Visit: Payer: Self-pay

## 2022-05-17 ENCOUNTER — Other Ambulatory Visit: Payer: Self-pay

## 2022-05-17 MED ORDER — OMEPRAZOLE 20 MG PO CPDR
DELAYED_RELEASE_CAPSULE | ORAL | 1 refills | Status: DC
  Filled 2022-05-17 – 2022-08-11 (×2): qty 90, 90d supply, fill #0

## 2022-05-18 ENCOUNTER — Other Ambulatory Visit: Payer: Self-pay

## 2022-06-09 ENCOUNTER — Other Ambulatory Visit: Payer: Self-pay

## 2022-06-15 ENCOUNTER — Other Ambulatory Visit: Payer: Self-pay

## 2022-06-15 MED ORDER — NAPROXEN 500 MG PO TABS
ORAL_TABLET | ORAL | 0 refills | Status: DC
  Filled 2022-06-15: qty 20, 10d supply, fill #0

## 2022-06-15 MED ORDER — CYCLOBENZAPRINE HCL 10 MG PO TABS
ORAL_TABLET | ORAL | 0 refills | Status: DC
  Filled 2022-06-15: qty 20, 10d supply, fill #0

## 2022-07-06 ENCOUNTER — Other Ambulatory Visit (HOSPITAL_COMMUNITY): Payer: Self-pay

## 2022-07-07 ENCOUNTER — Other Ambulatory Visit (HOSPITAL_COMMUNITY): Payer: Self-pay

## 2022-07-07 ENCOUNTER — Other Ambulatory Visit: Payer: Self-pay

## 2022-07-07 MED ORDER — MELOXICAM 7.5 MG PO TABS
ORAL_TABLET | ORAL | 1 refills | Status: DC
  Filled 2022-07-07 – 2022-07-13 (×4): qty 30, 30d supply, fill #0

## 2022-07-08 ENCOUNTER — Other Ambulatory Visit (HOSPITAL_COMMUNITY): Payer: Self-pay

## 2022-07-08 ENCOUNTER — Encounter: Payer: Self-pay | Admitting: Psychiatry

## 2022-07-08 ENCOUNTER — Telehealth (INDEPENDENT_AMBULATORY_CARE_PROVIDER_SITE_OTHER): Payer: No Typology Code available for payment source | Admitting: Psychiatry

## 2022-07-08 ENCOUNTER — Other Ambulatory Visit: Payer: Self-pay

## 2022-07-08 DIAGNOSIS — F3342 Major depressive disorder, recurrent, in full remission: Secondary | ICD-10-CM | POA: Diagnosis not present

## 2022-07-08 DIAGNOSIS — F410 Panic disorder [episodic paroxysmal anxiety] without agoraphobia: Secondary | ICD-10-CM

## 2022-07-08 DIAGNOSIS — F900 Attention-deficit hyperactivity disorder, predominantly inattentive type: Secondary | ICD-10-CM

## 2022-07-08 DIAGNOSIS — F40298 Other specified phobia: Secondary | ICD-10-CM | POA: Diagnosis not present

## 2022-07-08 MED ORDER — SERTRALINE HCL 25 MG PO TABS
25.0000 mg | ORAL_TABLET | Freq: Every day | ORAL | 1 refills | Status: DC
  Filled 2022-07-12: qty 90, 90d supply, fill #0
  Filled 2022-10-06: qty 90, 90d supply, fill #1

## 2022-07-08 MED ORDER — ALPRAZOLAM 0.25 MG PO TABS
0.2500 mg | ORAL_TABLET | Freq: Every day | ORAL | 0 refills | Status: AC | PRN
  Filled 2022-07-08: qty 21, 10d supply, fill #0

## 2022-07-08 NOTE — Progress Notes (Signed)
Virtual Visit via Video Note  I connected with Michele Houston on 07/08/22 at  2:00 PM EDT by a video enabled telemedicine application and verified that I am speaking with the correct person using two identifiers.  Location Provider Location : [facility] Patient Location : Car  Participants: Patient , Provider    I discussed the limitations of evaluation and management by telemedicine and the availability of in person appointments. The patient expressed understanding and agreed to proceed.   I discussed the assessment and treatment plan with the patient. The patient was provided an opportunity to ask questions and all were answered. The patient agreed with the plan and demonstrated an understanding of the instructions.   The patient was advised to call back or seek an in-person evaluation if the symptoms worsen or if the condition fails to improve as anticipated.  [facility] MD OP Progress Note  07/08/2022 5:22 PM Michele Houston  MRN:  [id number]  Chief Complaint:  Chief Complaint  Patient presents with   Follow-up: 36 year old Caucasian female who has a history of depression, panic attacks, ADHD was evaluated for medication management.   HPI: Michele Houston is a 36 year old Caucasian female, employed, lives in [place], has a history of MDD in remission, panic attacks, gastroesophageal reflux disease, ADHD was evaluated by telemedicine today.  Patient today reports she is currently doing fairly well.  Reports she is planning to fly to [facility] soon.  She hence would like a supply of the Xanax since she has of fear of flying.  Reports otherwise her anxiety has been under control.  Denies any sadness, hopelessness.  Reports she is compliant on the Zoloft.  Denies side effects.  Denies any suicidality, homicidality or perceptual disturbances.  Reports she does have some work-related stressors, although coping okay.  Denies any other concerns today.  Visit Diagnosis:    ICD-10-CM   1. 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 MDD (major depressive disorder), recurrent, in full remission ([place])  F33.42     4. Attention deficit hyperactivity disorder (ADHD), predominantly inattentive type  Michele Houston       Past Psychiatric History: Reviewed past psychiatric history from progress note on 07/16/2020.  Past trials of medications-Wellbutrin, Lexapro-suicidality, Zoloft, Celexa, Prozac-headaches, Klonopin, Effexor.  Past Medical History:  Past Medical History:  Diagnosis Date   Anemia    when pregnant only   GERD (gastroesophageal reflux disease)     Past Surgical History:  Procedure Laterality Date   CHOLECYSTECTOMY N/A 06/03/2015   Procedure: LAPAROSCOPIC CHOLECYSTECTOMY WITH INTRAOPERATIVE CHOLANGIOGRAM;  Surgeon: Robert Bellow, MD;  Location: [facility] ORS;  Service: General;  Laterality: N/A;   KNEE SURGERY Left 2011   arthroscopy   tubes in ears  baby    Family Psychiatric History: Reviewed family psychiatric history from progress note on 07/16/2020.  Family History:  Family History  Problem Relation Age of Onset   Hyperthyroidism Mother    Depression Mother    Lung cancer Father    Depression Father    Drug abuse Father    Lung cancer Paternal Uncle    Lung cancer Paternal Grandmother    Cervical cancer Sister    Depression Sister    Hypertension Brother    Bipolar disorder Sister     Social History: Reviewed social history from progress note on 07/16/2020. Social History   Socioeconomic History  Marital status: Married    Spouse name: Not on file   Number of children: 3   Years of education: Not on file   Highest education level: Not on file  Occupational History   Occupation: pharmacy tech    Employer: [employer]  Tobacco Use   Smoking status: Former    Packs/day: 1.00    Years: 4.00    Total pack years: 4.00    Types: Cigarettes    Quit date: 05/30/2011     Years since quitting: 11.1   Smokeless tobacco: Never  Vaping Use   Vaping Use: Never used  Substance and Sexual Activity   Alcohol use: Not Currently    Alcohol/week: 0.0 standard drinks of alcohol   Drug use: Never   Sexual activity: Yes    Birth control/protection: None    Comment: Vasectomy in husband  Other Topics Concern   Not on file  Social History Narrative   Not on file   Social Determinants of Health   Financial Resource Strain: Not on file  Food Insecurity: Not on file  Transportation Needs: Not on file  Physical Activity: Not on file  Stress: Not on file  Social Connections: Not on file    Allergies:  Allergies  Allergen Reactions   Ceclor [Cefaclor] Hives   Septra [Sulfamethoxazole-Trimethoprim] Nausea And Vomiting   Tri-Sprintec [Norgestimate-Eth Estradiol] Other (See Comments)    Headaches, increased anxiety    Metabolic Disorder Labs: Lab Results  Component Value Date   HGBA1C 5.6 05/08/2020   No results found for: "PROLACTIN" Lab Results  Component Value Date   CHOL 181 05/08/2020   TRIG 64.0 05/08/2020   HDL 81.80 05/08/2020   CHOLHDL 2 05/08/2020   VLDL 12.8 05/08/2020   LDLCALC 86 05/08/2020   LDLCALC 100 (H) 03/29/2019   Lab Results  Component Value Date   TSH 0.92 05/08/2020   TSH 1.14 03/29/2019    Therapeutic Level Labs: No results found for: "LITHIUM" No results found for: "VALPROATE" No results found for: "CBMZ"  Current Medications: Current Outpatient Medications  Medication Sig Dispense Refill   cetirizine (ZYRTEC) 10 MG tablet Take 10 mg by mouth daily.     cyclobenzaprine (FLEXERIL) 10 MG tablet Take 1 tablet (10 mg total) by mouth 2 (two) times daily as needed for Muscle spasms for up to 10 days 20 tablet 0   ferrous sulfate 325 (65 FE) MG EC tablet Take 1 tablet (325 mg total) by mouth daily. 30 tablet 1   meloxicam (MOBIC) 7.5 MG tablet Take by mouth.     omeprazole (PRILOSEC) 20 MG capsule Take 1 capsule (20  mg total) by mouth once daily 90 capsule 1   Semaglutide-Weight Management (WEGOVY) 0.25 MG/0.5ML SOAJ Inject 0.5 mLs (0.25 mg total) subcutaneously once a week 2 mL 1   ALPRAZolam (XANAX) 0.25 MG tablet Take 1-2 tablets (0.25-0.5 mg total) by mouth daily as needed for anxiety. 21 tablet 0   naproxen (NAPROSYN) 500 MG tablet Take 1 tablet (500 mg total) by mouth 2 (two) times daily with meals for 10 days (Patient not taking: Reported on 07/08/2022) 20 tablet 0   predniSONE (DELTASONE) 10 MG tablet Take 4 tablets (40mg ) on days 1-4, then 3 tablets (30mg ) on days 5-8, then 2 tablets (20mg ) on days 9-11, then 1 tablet daily for days 12-14. Take with food. (Patient not taking: Reported on 07/08/2022) 37 tablet 0   scopolamine (TRANSDERM-SCOP) 1 MG/3DAYS Place 1 patch (1.5 mg total) onto the  skin every third day for 30 days (Patient not taking: Reported on 07/08/2022) 10 patch 0   sertraline (ZOLOFT) 25 MG tablet Take 1 tablet (25 mg total) by mouth daily with breakfast. 90 tablet 1   No current facility-administered medications for this visit.     Musculoskeletal: Strength & Muscle Tone:  UTA Gait & Station:  Seated Patient leans: N/A  Psychiatric Specialty Exam: Review of Systems  Psychiatric/Behavioral: Negative.    All other systems reviewed and are negative.   There were no vitals taken for this visit.There is no height or weight on file to calculate BMI.  General Appearance: Casual  Eye Contact:  Fair  Speech:  Clear and Coherent  Volume:  Normal  Mood:  Euthymic  Affect:  Congruent  Thought Process:  Goal Directed and Descriptions of Associations: Intact  Orientation:  Full (Time, Place, and Person)  Thought Content: Logical   Suicidal Thoughts:  No  Homicidal Thoughts:  No  Memory:  Immediate;   Fair Recent;   Fair Remote;   Fair  Judgement:  Fair  Insight:  Fair  Psychomotor Activity:  Normal  Concentration:  Concentration: Fair and Attention Span: Fair  Recall:  Michele Houston of  Knowledge: Fair  Language: Fair  Akathisia:  No  Handed:  Right  AIMS (if indicated): not done  Assets:  Communication Skills Desire for Improvement Housing Social Support  ADL's:  Intact  Cognition: WNL  Sleep:  Fair   Screenings: AIMS    Flowsheet Row Video Visit [facility] 03/25/2022 in [facility]  AIMS Total Score 0      GAD-7    Flowsheet Row Video Visit from 03/25/2022 in [facility] Video Visit from 12/03/2021 in [facility] Video Visit from 06/05/2021 in [facility] Video Visit from 04/22/2021 in [facility] Video Visit from 03/12/2021 in [facility]  Total GAD-7 Score 0 1 0 1 1      PHQ2-9    Flowsheet Row Video Visit from 03/25/2022 in [facility] Counselor from 12/29/2021 in [facility] [place] Video Visit from 12/03/2021 in [facility] Video Visit from 06/05/2021 in [facility] Video Visit from 04/22/2021 in [facility]  PHQ-2 Total Score 0 0 0 0 0      Flowsheet Row Video Visit from 03/25/2022 in [facility] Counselor from 12/29/2021 in [facility] [place] Video Visit from 12/11/2020 in [facility]  C-SSRS RISK CATEGORY No Risk No Risk No Risk        Assessment and Plan: Michele Houston is a 36 year old Caucasian female, employed, lives in [place], has a history of panic disorder, ADHD, MDD in remission was evaluated by telemedicine today.  Patient is currently stable.  Plan Panic disorder-stable Zoloft 25 mg p.o. daily Xanax 0.25 mg p.o. daily as needed Reviewed [employer] PMP aware. Continue CBT as needed.  Specific phobia-flying-stable Xanax 0.25 mg as needed prior to her  flight.  MDD in remission Will monitor closely  ADHD-stable Patient is currently not on medications for the same.  Follow-up in clinic in 4 to 5 months or sooner if needed.    Consent: Patient/Guardian gives verbal consent for treatment and assignment of benefits for services provided during this visit. Patient/Guardian expressed understanding and agreed to proceed.   This note was generated in part or whole with voice  recognition software. Voice recognition is usually quite accurate but there are transcription errors that can and very often do occur. I apologize for any typographical errors that were not detected and corrected.      Jomarie Longs, MD 07/09/2022, 8:14 AM

## 2022-07-12 ENCOUNTER — Other Ambulatory Visit (HOSPITAL_COMMUNITY): Payer: Self-pay

## 2022-07-13 ENCOUNTER — Other Ambulatory Visit: Payer: Self-pay

## 2022-07-13 ENCOUNTER — Other Ambulatory Visit (HOSPITAL_COMMUNITY): Payer: Self-pay

## 2022-07-13 MED ORDER — WEGOVY 0.25 MG/0.5ML ~~LOC~~ SOAJ
0.2500 mg | SUBCUTANEOUS | 1 refills | Status: DC
  Filled 2022-07-13 – 2022-12-07 (×4): qty 2, 28d supply, fill #0

## 2022-08-09 ENCOUNTER — Other Ambulatory Visit (HOSPITAL_COMMUNITY): Payer: Self-pay

## 2022-08-11 ENCOUNTER — Other Ambulatory Visit (HOSPITAL_COMMUNITY): Payer: Self-pay

## 2022-08-18 ENCOUNTER — Other Ambulatory Visit (HOSPITAL_COMMUNITY): Payer: Self-pay

## 2022-08-31 ENCOUNTER — Ambulatory Visit (INDEPENDENT_AMBULATORY_CARE_PROVIDER_SITE_OTHER): Payer: No Typology Code available for payment source | Admitting: Licensed Clinical Social Worker

## 2022-08-31 ENCOUNTER — Encounter (HOSPITAL_COMMUNITY): Payer: Self-pay

## 2022-08-31 DIAGNOSIS — F900 Attention-deficit hyperactivity disorder, predominantly inattentive type: Secondary | ICD-10-CM

## 2022-08-31 DIAGNOSIS — F40298 Other specified phobia: Secondary | ICD-10-CM | POA: Diagnosis not present

## 2022-08-31 DIAGNOSIS — F3342 Major depressive disorder, recurrent, in full remission: Secondary | ICD-10-CM

## 2022-08-31 DIAGNOSIS — F410 Panic disorder [episodic paroxysmal anxiety] without agoraphobia: Secondary | ICD-10-CM | POA: Diagnosis not present

## 2022-08-31 NOTE — Plan of Care (Signed)
  Problem: Anxiety Disorder CCP Problem  1  Goal: LTG: Patient will score less than 5 on the Generalized Anxiety Disorder 7 Scale (GAD-7) Outcome: Not Progressing Goal: STG: Patient will practice problem solving skills 3 times per week for the next 4 weeks Outcome: Not Progressing

## 2022-08-31 NOTE — Progress Notes (Signed)
THERAPIST PROGRESS NOTE  Session Time: 4:00 PM to 4:50 PM  Participation Level: Active  Behavioral Response: CasualAlertAnxious  Type of Therapy: Individual Therapy  Treatment Goals addressed: Anxiety, managing family stressors, coping  ProgressTowards Goals: Initial  Interventions: Solution Focused, Strength-based, Supportive, Family Systems, Reframing, and Other: coping  Summary: Michele Houston is a 36 y.o. female who presents with everything good to recently issues with family. She needs help with dealing with family.  There is her Mom and biological dad has passed. Patient is the only child that they had. Mom remarried and had three children by her stepdad. Has two brothers and sister that grew up in the same house. Issue that having this minute is that they have a lot of family history of trauma. Which sometimes doesn't allow them to get along. Really big in church right now. Have been going to church for past 13 years. They have switched churches twice. This last church started she and her husband only 1 going to church with kids for past two months ago. Mom married a pastor last October went with him to be at his church. It has been a year that got new pastor. Brother and this pastor didn't get along. Brother a Human resources officer. New pastor, [name], and brother, [name], had a conversation where they decided don't like each other. Brother ultimately decided to leave church and youth group. Two of the kids are in youth group. Everything was fine. Brother was going to another church everything fine. At church and love her church. Husband felt like should take over youth group. Kids Pricilla Holm, [name] need a good youth group. Husband asked her not to tell anybody just pray about it and reflect on it. Patient didn't tell anybody. Thought at the time that she should tell family but her husband is the kind of person he needs silence when he does something serious doesn't need to hear another people's  opinions. They are naturally discouragers hear put it down. Especially brother [name]. A call went over the church that they were taking over Youth Group and taking kids bowling. Mom and brother got the call as brother had been youth group leader. They were mad. Mad they didn't tell them, that took youth group and betrayed brother. First time saw mom after saw look on face has a look I hate you. Wondered what was wrong with her. Every single time saw looked like that didn't speak mean and hateful. Even if spoke was rude. Then brother  came to baseball and he acted the same way. Older son noticed. Patient did not acknowledge their behavior did not want to give them the satisfaction that bothering her. Thought Facebook post not what mad at. Brother said hurt took over youth group. Felt betrayed that pastor [name] slammed his name wrong for talking the Youth Group. Cried all day at work. Wrote them a letter. Read to therapist and therapist said well said. Haven't heard back from them. Important for kids to have youth group, he left church didn't know anything that [place] said. Bothers her that his girls love her they are 8 and 3 and they don't know what is going on. Never got along with Mom. Lost sleep and finally told her yesterday not going to Thanksgiving, if things opposite patient would never do something like this. Over the years things did to her never caused argument in family. What John doing is wrong Let's move past patient would have be the one to say it,  don't think would be the same, do think can get going at some point and not bring it up. Sister is therapist has a lot of issues with family, family would write her off, patient close with her doesn't do that with her, only one in family does. Feels like will be like that for her with family.  In simple terms boils down to they are being hurtful just because she is taking over a youth group. Patient has communicated to Mom different from them not treat them like  they treat her. Can't change it but they have to answer for that someday. If they can live with it they can have it. Husband can't stand to see them treat her like this over and over. Patient says thinks going to stop allowing it and standing her ground and not allow her to treat her this way. Won't treat them badly but won't allow them to treat her badly. Setting boundaries family does a lot together. Boundary one vacation a year, one holiday get together. Best time carving pumpkins on their own. Most fun they have had. Included kids in decision. Start new family traditions and let extended family do their thing. Spend time they need to spend but not over do it. Brother has been mean to kids. Not worry about being mean to kids. Things they do patient doesn't say anything things should say don't want to cause argument don't care if say something to her at the same time.    Therapist has not seen patient since last February so reviewed any significant events any changes in mood and functioning.  Completed treatment plan in session patient gave consent to complete through verbal consent.  Patient's focus is on family stressors reviewed in more detail things that have happened that led to the stressors.  Therapist perspective was to agree with patient that her taking overuse group was not a reason to have family treat her negatively.  It actually was a positive thing to fill in the role brother had left.  She did not know anything about interactions with pastor and her brother, and brother left the church leaving an opening.  Reviewed dynamics of family have been this way over the years where patient has not said anything when they have been hurtful.  Noted setting some boundaries as a positive thing for patient perhaps will cause them to reflect but may be not about their part in this situation.  Was offered another possibility of just letting this go realizing the value of family more important than holding onto  grudges but unclear at this point how this will evolve.  Patient does note doing things with her family is actually a positive outcome that enjoying also does not have to have kids be subjected to brother who is not been nice.  Therapist provided support and space for patient to talk about thoughts and feelings in session  Suicidal/Homicidal: No  Plan: Return again in 1 month.2.  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 Shea Evans last note  Patient/Guardian was advised Release of Information must be obtained prior to any record release in order to collaborate their care with an outside provider. Patient/Guardian was advised if they have not already done so to contact the registration department to sign all necessary forms in order for  [place] to release information regarding their care.   Consent: Patient/Guardian gives verbal consent for treatment and assignment of benefits for services provided during this visit. Patient/Guardian expressed understanding and agreed to proceed.   Coolidge Breeze, LCSW 08/31/2022

## 2022-08-31 NOTE — Plan of Care (Signed)
Patient participated in completing treatment plan ?

## 2022-09-14 ENCOUNTER — Telehealth: Payer: No Typology Code available for payment source | Admitting: Family Medicine

## 2022-09-14 ENCOUNTER — Telehealth: Payer: No Typology Code available for payment source | Admitting: Physician Assistant

## 2022-09-14 DIAGNOSIS — B9689 Other specified bacterial agents as the cause of diseases classified elsewhere: Secondary | ICD-10-CM | POA: Diagnosis not present

## 2022-09-14 DIAGNOSIS — J019 Acute sinusitis, unspecified: Secondary | ICD-10-CM

## 2022-09-14 DIAGNOSIS — U071 COVID-19: Secondary | ICD-10-CM

## 2022-09-14 MED ORDER — DOXYCYCLINE HYCLATE 100 MG PO TABS: 100.0000 mg | ORAL_TABLET | Freq: Two times a day (BID) | ORAL | 0 refills | Status: DC

## 2022-09-14 NOTE — Progress Notes (Signed)
Virtual Visit Consent   Michele Houston, you are scheduled for a virtual visit with a [facility] provider today. Just as with appointments in the office, your consent must be obtained to participate. Your consent will be active for this visit and any virtual visit you may have with one of our providers in the next 365 days. If you have a MyChart account, a copy of this consent can be sent to you electronically.  As this is a virtual visit, video technology does not allow for your provider to perform a traditional examination. This may limit your provider's ability to fully assess your condition. If your provider identifies any concerns that need to be evaluated in person or the need to arrange testing (such as labs, EKG, etc.), we will make arrangements to do so. Although advances in technology are sophisticated, we cannot ensure that it will always work on either your end or our end. If the connection with a video visit is poor, the visit may have to be switched to a telephone visit. With either a video or telephone visit, we are not always able to ensure that we have a secure connection.  By engaging in this virtual visit, you consent to the provision of healthcare and authorize for your insurance to be billed (if applicable) for the services provided during this visit. Depending on your insurance coverage, you may receive a charge related to this service.  I need to obtain your verbal consent now. Are you willing to proceed with your visit today? Michele Houston has provided verbal consent on 09/14/2022 for a virtual visit (video or telephone). Michele Houston, [place]  Date: 09/14/2022 8:53 AM  Virtual Visit via Video Note   I, Michele Houston, connected with  Michele Houston  ([id number], 08/26/1986) on 09/14/22 at  9:00 AM EST by a video-enabled telemedicine application and verified that I am speaking with the correct person using two identifiers.  Location: Patient: Virtual Visit  Location Patient: Home Provider: Virtual Visit Location Provider: Home Office   I discussed the limitations of evaluation and management by telemedicine and the availability of in person appointments. The patient expressed understanding and agreed to proceed.    History of Present Illness: Michele Houston is a 36 y.o. who identifies as a female who was assigned female at birth, and is being seen today for concern of sinus infection following COVID-19. Symptom onset was 6-7 days ago, testing positive for COVID-19 having cough, congestion, headache and fatigue. Most symptoms improving/resolving but over past 48 hours with change in nasal drainage to thick and yellow with maxillary sinus pain, sinus headache and upper teeth pain. Denies chest pain, SOB. Denies fever.  HPI: HPI  Problems:  Patient Active Problem List   Diagnosis Date Noted   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   Panic disorder 09/22/2020   URI with cough and congestion 09/12/2020   Acute non-recurrent sinusitis 09/12/2020   GAD (generalized anxiety disorder) 07/16/2020   Attention deficit hyperactivity disorder (ADHD), predominantly inattentive type 07/16/2020   MDD (major depressive disorder), recurrent, in full remission (HCC) 07/16/2020   BMI 29.0-29.9,adult 05/08/2020   Heart palpitations 05/08/2020   Need for hepatitis C screening test 05/08/2020   Migraine 12/29/2018   IUD (intrauterine device) in place 12/06/2018   Anxiety and depression 12/06/2018   Anxiety 12/06/2018   Hemorrhoids 12/08/2015   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   Recurrent major depressive disorder (HCC) 08/11/2015    Allergies:  Allergies  Allergen Reactions  Ceclor [Cefaclor] Hives   Septra [Sulfamethoxazole-Trimethoprim] Nausea And Vomiting   Tri-Sprintec [Norgestimate-Eth Estradiol] Other (See Comments)    Headaches, increased anxiety   Medications:  Current Outpatient Medications:    ALPRAZolam (XANAX) 0.25 MG tablet, Take 1-2 tablets (0.25-0.5  mg total) by mouth daily as needed for anxiety., Disp: 21 tablet, Rfl: 0   cetirizine (ZYRTEC) 10 MG tablet, Take 10 mg by mouth daily., Disp: , Rfl:    cyclobenzaprine (FLEXERIL) 10 MG tablet, Take 1 tablet (10 mg total) by mouth 2 (two) times daily as needed for Muscle spasms for up to 10 days, Disp: 20 tablet, Rfl: 0   ferrous sulfate 325 (65 FE) MG EC tablet, Take 1 tablet (325 mg total) by mouth daily., Disp: 30 tablet, Rfl: 1   meloxicam (MOBIC) 7.5 MG tablet, Take 1 tablet (7.5 mg total) by mouth once daily for 60 days (Patient not taking: Reported on 07/08/2022), Disp: 30 tablet, Rfl: 1   naproxen (NAPROSYN) 500 MG tablet, Take 1 tablet (500 mg total) by mouth 2 (two) times daily with meals for 10 days (Patient not taking: Reported on 07/08/2022), Disp: 20 tablet, Rfl: 0   omeprazole (PRILOSEC) 20 MG capsule, Take 1 capsule (20 mg total) by mouth once daily, Disp: 90 capsule, Rfl: 1   predniSONE (DELTASONE) 10 MG tablet, Take 4 tablets (40mg ) on days 1-4, then 3 tablets (30mg ) on days 5-8, then 2 tablets (20mg ) on days 9-11, then 1 tablet daily for days 12-14. Take with food. (Patient not taking: Reported on 07/08/2022), Disp: 37 tablet, Rfl: 0   scopolamine (TRANSDERM-SCOP) 1 MG/3DAYS, Place 1 patch (1.5 mg total) onto the skin every third day for 30 days (Patient not taking: Reported on 07/08/2022), Disp: 10 patch, Rfl: 0   Semaglutide-Weight Management (WEGOVY) 0.25 MG/0.5ML SOAJ, Inject 0.25 mg into the skin once a week., Disp: 2 mL, Rfl: 1   sertraline (ZOLOFT) 25 MG tablet, Take 1 tablet (25 mg total) by mouth daily with breakfast., Disp: 90 tablet, Rfl: 1  Observations/Objective: Patient is well-developed, well-nourished in no acute distress.  Resting comfortably at home.  Head is normocephalic, atraumatic.  No labored breathing. Speech is clear and coherent with logical content.  Patient is alert and oriented at baseline.   Assessment and Plan: There are no diagnoses linked to this  encounter. Secondary sinusitis after COVID. Concern for bacterial etiology. Rx doxycycline.  Increase fluids.  Rest.  Saline nasal spray.  Probiotic.  Mucinex as directed.  Humidifier in bedroom. OTC meds reviewed.  Call or return to clinic if symptoms are not improving.   Follow Up Instructions: I discussed the assessment and treatment plan with the patient. The patient was provided an opportunity to ask questions and all were answered. The patient agreed with the plan and demonstrated an understanding of the instructions.  A copy of instructions were sent to the patient via MyChart unless otherwise noted below.   The patient was advised to call back or seek an in-person evaluation if the symptoms worsen or if the condition fails to improve as anticipated.  Time:  I spent 10 minutes with the patient via telehealth technology discussing the above problems/concerns.    , PA-C

## 2022-09-14 NOTE — Patient Instructions (Signed)
Michele Houston, thank you for joining [name], PA-C for today's virtual visit.  While this provider is not your primary care provider (PCP), if your PCP is located in our provider database this encounter information will be shared with them immediately following your visit.   A [facility] MyChart account gives you access to today's visit and all your visits, tests, and labs performed at [facility] " click here if you don't have a [facility] MyChart account or go to [email or web address]  Consent: (Patient) Michele Houston provided verbal consent for this virtual visit at the beginning of the encounter.  Current Medications:  Current Outpatient Medications:    ALPRAZolam (XANAX) 0.25 MG tablet, Take 1-2 tablets (0.25-0.5 mg total) by mouth daily as needed for anxiety., Disp: 21 tablet, Rfl: 0   cetirizine (ZYRTEC) 10 MG tablet, Take 10 mg by mouth daily., Disp: , Rfl:    cyclobenzaprine (FLEXERIL) 10 MG tablet, Take 1 tablet (10 mg total) by mouth 2 (two) times daily as needed for Muscle spasms for up to 10 days, Disp: 20 tablet, Rfl: 0   ferrous sulfate 325 (65 FE) MG EC tablet, Take 1 tablet (325 mg total) by mouth daily., Disp: 30 tablet, Rfl: 1   omeprazole (PRILOSEC) 20 MG capsule, Take 1 capsule (20 mg total) by mouth once daily, Disp: 90 capsule, Rfl: 1   scopolamine (TRANSDERM-SCOP) 1 MG/3DAYS, Place 1 patch (1.5 mg total) onto the skin every third day for 30 days (Patient not taking: Reported on 07/08/2022), Disp: 10 patch, Rfl: 0   Semaglutide-Weight Management (WEGOVY) 0.25 MG/0.5ML SOAJ, Inject 0.25 mg into the skin once a week., Disp: 2 mL, Rfl: 1   sertraline (ZOLOFT) 25 MG tablet, Take 1 tablet (25 mg total) by mouth daily with breakfast., Disp: 90 tablet, Rfl: 1   Medications ordered in this encounter:  No orders of the defined types were placed in this encounter.    *If you need refills on other medications prior to your next appointment, please  contact your pharmacy*  Follow-Up: Call back or seek an in-person evaluation if the symptoms worsen or if the condition fails to improve as anticipated.  [facility] [phone]  Other Instructions Please take antibiotic as directed.  Increase fluid intake.  Use Saline nasal spray.  Take a daily multivitamin. Start Vitamin D3 1000 units daily and Vitamin C 1000 mg daily. Use Mucinex to thin congestion.  Place a humidifier in the bedroom.  Please call or return clinic if symptoms are not improving.  Sinusitis Sinusitis is redness, soreness, and swelling (inflammation) of the paranasal sinuses. Paranasal sinuses are air pockets within the bones of your face (beneath the eyes, the middle of the forehead, or above the eyes). In healthy paranasal sinuses, mucus is able to drain out, and air is able to circulate through them by way of your nose. However, when your paranasal sinuses are inflamed, mucus and air can become trapped. This can allow bacteria and other germs to grow and cause infection. Sinusitis can develop quickly and last only a short time (acute) or continue over a long period (chronic). Sinusitis that lasts for more than 12 weeks is considered chronic.  CAUSES  Causes of sinusitis include: Allergies. Structural abnormalities, such as displacement of the cartilage that separates your nostrils (deviated septum), which can decrease the air flow through your nose and sinuses and affect sinus drainage. Functional abnormalities, such as when the small hairs (cilia) that line your  sinuses and help remove mucus do not work properly or are not present. SYMPTOMS  Symptoms of acute and chronic sinusitis are the same. The primary symptoms are pain and pressure around the affected sinuses. Other symptoms include: Upper toothache. Earache. Headache. Bad breath. Decreased sense of smell and taste. A cough, which worsens when you are lying flat. Fatigue. Fever. Thick drainage  from your nose, which often is green and may contain pus (purulent). Swelling and warmth over the affected sinuses. DIAGNOSIS  Your caregiver will perform a physical exam. During the exam, your caregiver may: Look in your nose for signs of abnormal growths in your nostrils (nasal polyps). Tap over the affected sinus to check for signs of infection. View the inside of your sinuses (endoscopy) with a special imaging device with a light attached (endoscope), which is inserted into your sinuses. If your caregiver suspects that you have chronic sinusitis, one or more of the following tests may be recommended: Allergy tests. Nasal culture A sample of mucus is taken from your nose and sent to a lab and screened for bacteria. Nasal cytology A sample of mucus is taken from your nose and examined by your caregiver to determine if your sinusitis is related to an allergy. TREATMENT  Most cases of acute sinusitis are related to a viral infection and will resolve on their own within 10 days. Sometimes medicines are prescribed to help relieve symptoms (pain medicine, decongestants, nasal steroid sprays, or saline sprays).  However, for sinusitis related to a bacterial infection, your caregiver will prescribe antibiotic medicines. These are medicines that will help kill the bacteria causing the infection.  Rarely, sinusitis is caused by a fungal infection. In theses cases, your caregiver will prescribe antifungal medicine. For some cases of chronic sinusitis, surgery is needed. Generally, these are cases in which sinusitis recurs more than 3 times per year, despite other treatments. HOME CARE INSTRUCTIONS  Drink plenty of water. Water helps thin the mucus so your sinuses can drain more easily. Use a humidifier. Inhale steam 3 to 4 times a day (for example, sit in the bathroom with the shower running). Apply a warm, moist washcloth to your face 3 to 4 times a day, or as directed by your caregiver. Use saline  nasal sprays to help moisten and clean your sinuses. Take over-the-counter or prescription medicines for pain, discomfort, or fever only as directed by your caregiver. SEEK IMMEDIATE MEDICAL CARE IF: You have increasing pain or severe headaches. You have nausea, vomiting, or drowsiness. You have swelling around your face. You have vision problems. You have a stiff neck. You have difficulty breathing. MAKE SURE YOU:  Understand these instructions. Will watch your condition. Will get help right away if you are not doing well or get worse. Document Released: 10/18/2005 Document Revised: 01/10/2012 Document Reviewed: 11/02/2011 [facility] Patient Information 2014 [place], [place].    If you have been instructed to have an in-person evaluation today at a local Urgent Care facility, please use the link below. It will take you to a list of all of our available [facility], including address, phone number and hours of operation. Please do not delay care.  [facility]  If you or a family member do not have a primary care provider, use the link below to schedule a visit and establish care. When you choose a [facility] primary care physician or advanced practice provider, you gain a long-term partner in health. Find a Primary Care Provider  Learn  more about [facility]'s in-office and virtual care options: [facility]

## 2022-09-14 NOTE — Progress Notes (Signed)
Thank you for the details you included in the comment boxes. Those details are very helpful in determining the best course of treatment for you and help us to provide the best care.Because COVID +, we recommend that you convert this visit to a video visit in order for the provider to better assess what is going on.  The provider will be able to give you a more accurate diagnosis and treatment plan if we can more freely discuss your symptoms and with the addition of a virtual examination.    If you convert to a video visit, we will bill your insurance (similar to an office visit) and you will not be charged for this e-Visit. You will be able to stay at home and speak with the first available [facility] advanced practice provider. The link to do a video visit is in the drop down Menu tab of your Welcome screen in MyChart.   

## 2022-10-06 ENCOUNTER — Other Ambulatory Visit: Payer: Self-pay

## 2022-10-20 ENCOUNTER — Other Ambulatory Visit: Payer: Self-pay

## 2022-10-27 ENCOUNTER — Ambulatory Visit: Admit: 2022-10-27 | Discharge status: Absent without leave | Payer: No Typology Code available for payment source

## 2022-10-29 ENCOUNTER — Other Ambulatory Visit (HOSPITAL_COMMUNITY): Payer: Self-pay

## 2022-11-15 ENCOUNTER — Other Ambulatory Visit (HOSPITAL_COMMUNITY): Payer: Self-pay

## 2022-11-16 ENCOUNTER — Other Ambulatory Visit: Payer: Self-pay

## 2022-11-16 DIAGNOSIS — M25531 Pain in right wrist: Secondary | ICD-10-CM | POA: Diagnosis not present

## 2022-11-16 DIAGNOSIS — R200 Anesthesia of skin: Secondary | ICD-10-CM | POA: Diagnosis not present

## 2022-11-16 DIAGNOSIS — R202 Paresthesia of skin: Secondary | ICD-10-CM | POA: Diagnosis not present

## 2022-11-16 MED ORDER — PREDNISONE 10 MG PO TABS
ORAL_TABLET | ORAL | 0 refills | Status: DC
  Filled 2022-11-16: qty 20, 8d supply, fill #0

## 2022-11-23 ENCOUNTER — Other Ambulatory Visit: Payer: Self-pay

## 2022-11-23 ENCOUNTER — Other Ambulatory Visit: Payer: Self-pay | Admitting: Psychiatry

## 2022-11-23 DIAGNOSIS — F40298 Other specified phobia: Secondary | ICD-10-CM

## 2022-11-23 MED ORDER — ALPRAZOLAM 0.25 MG PO TABS
0.2500 mg | ORAL_TABLET | Freq: Every day | ORAL | 0 refills | Status: DC | PRN
  Filled 2022-11-23: qty 12, 6d supply, fill #0

## 2022-11-24 ENCOUNTER — Other Ambulatory Visit: Payer: Self-pay

## 2022-12-07 ENCOUNTER — Other Ambulatory Visit (HOSPITAL_COMMUNITY): Payer: Self-pay

## 2022-12-07 ENCOUNTER — Other Ambulatory Visit: Payer: Self-pay

## 2022-12-08 ENCOUNTER — Encounter: Payer: Self-pay | Admitting: Psychiatry

## 2022-12-08 ENCOUNTER — Ambulatory Visit (INDEPENDENT_AMBULATORY_CARE_PROVIDER_SITE_OTHER): Payer: 59 | Admitting: Psychiatry

## 2022-12-08 VITALS — BP 115/80 | HR 80 | Temp 98.5°F | Ht 67.0 in | Wt 211.0 lb

## 2022-12-08 DIAGNOSIS — F410 Panic disorder [episodic paroxysmal anxiety] without agoraphobia: Secondary | ICD-10-CM

## 2022-12-08 DIAGNOSIS — F3342 Major depressive disorder, recurrent, in full remission: Secondary | ICD-10-CM

## 2022-12-08 DIAGNOSIS — F900 Attention-deficit hyperactivity disorder, predominantly inattentive type: Secondary | ICD-10-CM

## 2022-12-08 DIAGNOSIS — F40298 Other specified phobia: Secondary | ICD-10-CM | POA: Diagnosis not present

## 2022-12-08 NOTE — Progress Notes (Unsigned)
[place] MD OP Progress Note  12/08/2022 5:09 PM [name]  MRN:  [id number]  Chief Complaint:  Chief Complaint  Patient presents with   Follow-up   Medication Refill   Anxiety   HPI: Michele HELLENBRAND is a 37 year old, caucasian female, employed, lives in [place], has a history of MDD in remission, panic attacks, gastroesophageal reflux disease, ADHD was evaluated in office today.  Patient today reports overall she has been doing fairly well on the current medication regimen.  She does report situational stressors, job related stressors, however has been coping okay with that.  She also reports that her grandfather is currently admitted to the hospital.  She reports she is very close to her grandpa and hence it has been difficult.  He however is currently improving and likely will be discharged tomorrow and that is a relief.  Patient does struggle with carpal tunnel syndrome pain, has a splint that she wear on her right wrist.  Has upcoming appointment with neurology for nerve conduction tests.  She reports since she started wearing the splint, pain has been more manageable and she is able to sleep through the night.  Otherwise it was affecting her sleep.  Patient is currently compliant on the Zoloft.  Denies side effects.  She does struggle with her weight, used to be on Wegovy previously.  She reports she is having a hard time managing her diet as well as exercising.  Patient denies any suicidality, homicidality or perceptual disturbances.  Patient denies any other concerns today.  Visit Diagnosis:    ICD-10-CM   1. 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 MDD (major depressive disorder), recurrent, in full remission ([place])  F33.42     4. Attention deficit hyperactivity disorder (ADHD), predominantly inattentive type  F90.0       Past Psychiatric History: Reviewed past psychiatric history from progress note on 07/16/2020.  Past trials of  medications-Wellbutrin, Lexapro-suicidality, Zoloft, Celexa, Prozac-headaches, Klonopin, Effexor.  Past Medical History:  Past Medical History:  Diagnosis Date   Anemia    when pregnant only   GERD (gastroesophageal reflux disease)     Past Surgical History:  Procedure Laterality Date   CHOLECYSTECTOMY N/A 06/03/2015   Procedure: LAPAROSCOPIC CHOLECYSTECTOMY WITH INTRAOPERATIVE CHOLANGIOGRAM;  Surgeon: Robert Bellow, MD;  Location: [facility] ORS;  Service: General;  Laterality: N/A;   KNEE SURGERY Left 2011   arthroscopy   tubes in ears  baby    Family Psychiatric History: Reviewed family psychiatric history from progress note on 07/16/2020.  Family History:  Family History  Problem Relation Age of Onset   Hyperthyroidism Mother    Depression Mother    Lung cancer Father    Depression Father    Drug abuse Father    Lung cancer Paternal Uncle    Lung cancer Paternal Grandmother    Cervical cancer Sister    Depression Sister    Hypertension Brother    Bipolar disorder Sister     Social History: Reviewed social history from progress note on 07/16/2020. Social History   Socioeconomic History   Marital status: Married    Spouse name: Not on file   Number of children: 3   Years of education: Not on file   Highest education level: Not on file  Occupational History   Occupation: pharmacy tech    Employer: [employer]  Tobacco Use   Smoking status: Former  Packs/day: 1.00    Years: 4.00    Total pack years: 4.00    Types: Cigarettes    Quit date: 05/30/2011    Years since quitting: 11.5   Smokeless tobacco: Never  Vaping Use   Vaping Use: Never used  Substance and Sexual Activity   Alcohol use: Not Currently    Alcohol/week: 0.0 standard drinks of alcohol   Drug use: Never   Sexual activity: Yes    Birth control/protection: None    Comment: Vasectomy in husband  Other Topics Concern   Not on file  Social History Narrative   Not on file   Social Determinants  of Health   Financial Resource Strain: Not on file  Food Insecurity: Not on file  Transportation Needs: Not on file  Physical Activity: Not on file  Stress: Not on file  Social Connections: Not on file    Allergies:  Allergies  Allergen Reactions   Ceclor [Cefaclor] Hives   Septra [Sulfamethoxazole-Trimethoprim] Nausea And Vomiting   Tri-Sprintec [Norgestimate-Eth Estradiol] Other (See Comments)    Headaches, increased anxiety    Metabolic Disorder Labs: Lab Results  Component Value Date   HGBA1C 5.6 05/08/2020   No results found for: "PROLACTIN" Lab Results  Component Value Date   CHOL 181 05/08/2020   TRIG 64.0 05/08/2020   HDL 81.80 05/08/2020   CHOLHDL 2 05/08/2020   VLDL 12.8 05/08/2020   LDLCALC 86 05/08/2020   LDLCALC 100 (H) 03/29/2019   Lab Results  Component Value Date   TSH 0.92 05/08/2020   TSH 1.14 03/29/2019    Therapeutic Level Labs: No results found for: "LITHIUM" No results found for: "VALPROATE" No results found for: "CBMZ"  Current Medications: Current Outpatient Medications  Medication Sig Dispense Refill   ALPRAZolam (XANAX) 0.25 MG tablet Take 1-2 tablets (0.25-0.5 mg total) by mouth daily as needed for anxiety. 12 tablet 0   cetirizine (ZYRTEC) 10 MG tablet Take 10 mg by mouth daily.     ferrous sulfate 325 (65 FE) MG EC tablet Take 1 tablet (325 mg total) by mouth daily. 30 tablet 1   omeprazole (PRILOSEC) 20 MG capsule Take 1 capsule (20 mg total) by mouth once daily 90 capsule 1   sertraline (ZOLOFT) 25 MG tablet Take 1 tablet (25 mg total) by mouth daily with breakfast. 90 tablet 1   scopolamine (TRANSDERM-SCOP) 1 MG/3DAYS Place 1 patch (1.5 mg total) onto the skin every third day for 30 days (Patient not taking: Reported on 07/08/2022) 10 patch 0   Semaglutide-Weight Management (WEGOVY) 0.25 MG/0.5ML SOAJ Inject 0.25 mg into the skin once a week. (Patient not taking: Reported on 12/08/2022) 2 mL 1   No current facility-administered  medications for this visit.     Musculoskeletal: Strength & Muscle Tone: within normal limits Gait & Station: normal Patient leans: N/A  Psychiatric Specialty Exam: Review of Systems  Musculoskeletal:        Rt, sided wrist splint - wrist pain   Psychiatric/Behavioral:  The patient is nervous/anxious.   All other systems reviewed and are negative.   Blood pressure 115/80, pulse 80, temperature 98.5 F (36.9 C), temperature source Oral, height 5\' 7"  (1.702 m), weight 211 lb (95.7 kg), SpO2 98 %.Body mass index is 33.05 kg/m.  General Appearance: Casual  Eye Contact:  Fair  Speech:  Clear and Coherent  Volume:  Normal  Mood:  Anxious  Affect:  Appropriate  Thought Process:  Goal Directed and Descriptions of Associations: Intact  Orientation:  Full (Time, Place, and Person)  Thought Content: Logical   Suicidal Thoughts:  No  Homicidal Thoughts:  No  Memory:  Immediate;   Fair Recent;   Fair Remote;   Fair  Judgement:  Fair  Insight:  Fair  Psychomotor Activity:  Normal  Concentration:  Concentration: Fair and Attention Span: Fair  Recall:  [employer] of Knowledge: Fair  Language: Fair  Akathisia:  No  Handed:  Right  AIMS (if indicated): not done  Assets:  Communication Skills Desire for [place] Talents/Skills Transportation  ADL's:  Intact  Cognition: WNL  Sleep:  Fair   Screenings: AIMS    Flowsheet Row Video Visit from 03/25/2022 in [place] Total Score 0      [place] Office Visit from 12/08/2022 in [place] Video Visit from 03/25/2022 in [place] Video Visit from 12/03/2021 in [place] Video Visit from 06/05/2021 in [place] Video Visit from 04/22/2021 in [facility]  Total GAD-7 Score 0 0 1 0 1      PHQ2-9    [place] Office Visit from 12/08/2022 in [place] Video Visit from 03/25/2022 in [place] from 12/29/2021 in [place] at [facility] Video Visit from 12/03/2021 in [place] Video Visit from 06/05/2021 in [place]  PHQ-2 Total Score 0 0 0 0 0  PHQ-9 Total Score 6 -- -- -- --      [place] Office Visit from 12/08/2022 in [place] Video Visit from 03/25/2022 in [place] Counselor from 12/29/2021 in [place] at [place] No Risk No Risk No Risk        Assessment and Plan: KENYOTTA DORFMAN is a 37 year old Caucasian female, employed, lives in [place], has a history of panic disorder, ADHD, MDD in remission was evaluated in office today.  Patient is currently stable.  Plan Panic disorder-stable Zoloft 25 mg p.o. daily Xanax 0.25 mg p.o. daily as needed Reviewed [place] PMP AWARxE  Specific phobia-flying-stable Xanax 0.25 mg as needed prior to her flight.  MDD in remission Will monitor closely  ADHD-stable Currently not on medications.  Discussed lifestyle modification, weight management, patient also provided information for [place] weight loss clinic.  Patient to discuss with primary care provider for referral.  Patient to follow-up with neurology for and her other providers for her carpal tunnel syndrome.  Follow-up in clinic in 4 to 5 months or sooner if needed.  This note was generated in part or whole with voice recognition software. Voice recognition is usually quite accurate but there are transcription errors that can and very often do  occur. I apologize for any typographical errors that were not detected and corrected.     Ursula Alert, MD 12/09/2022, 11:49 AM

## 2022-12-09 ENCOUNTER — Other Ambulatory Visit: Payer: Self-pay

## 2022-12-10 ENCOUNTER — Other Ambulatory Visit: Payer: Self-pay

## 2022-12-14 ENCOUNTER — Other Ambulatory Visit: Payer: Self-pay

## 2022-12-17 ENCOUNTER — Other Ambulatory Visit: Payer: Self-pay

## 2022-12-23 ENCOUNTER — Other Ambulatory Visit (HOSPITAL_COMMUNITY): Payer: Self-pay

## 2023-01-07 ENCOUNTER — Other Ambulatory Visit: Payer: Self-pay | Admitting: Psychiatry

## 2023-01-07 ENCOUNTER — Other Ambulatory Visit: Payer: Self-pay

## 2023-01-07 DIAGNOSIS — F410 Panic disorder [episodic paroxysmal anxiety] without agoraphobia: Secondary | ICD-10-CM

## 2023-01-07 MED ORDER — SERTRALINE HCL 25 MG PO TABS
25.0000 mg | ORAL_TABLET | Freq: Every day | ORAL | 1 refills | Status: DC
  Filled 2023-01-07: qty 90, 90d supply, fill #0
  Filled 2023-04-06: qty 90, 90d supply, fill #1

## 2023-02-24 ENCOUNTER — Other Ambulatory Visit: Payer: Self-pay | Admitting: Psychiatry

## 2023-02-24 DIAGNOSIS — F40298 Other specified phobia: Secondary | ICD-10-CM

## 2023-02-25 ENCOUNTER — Other Ambulatory Visit: Payer: Self-pay

## 2023-02-25 ENCOUNTER — Other Ambulatory Visit: Payer: Self-pay | Admitting: Psychiatry

## 2023-02-25 DIAGNOSIS — F40298 Other specified phobia: Secondary | ICD-10-CM

## 2023-02-25 DIAGNOSIS — G5601 Carpal tunnel syndrome, right upper limb: Secondary | ICD-10-CM | POA: Diagnosis not present

## 2023-02-25 MED FILL — Alprazolam Tab 0.25 MG: ORAL | 6 days supply | Qty: 12 | Fill #0 | Status: AC

## 2023-02-27 ENCOUNTER — Other Ambulatory Visit: Payer: Self-pay

## 2023-04-06 ENCOUNTER — Other Ambulatory Visit: Payer: Self-pay

## 2023-04-06 DIAGNOSIS — M25531 Pain in right wrist: Secondary | ICD-10-CM | POA: Diagnosis not present

## 2023-04-06 DIAGNOSIS — R200 Anesthesia of skin: Secondary | ICD-10-CM | POA: Diagnosis not present

## 2023-04-06 DIAGNOSIS — R202 Paresthesia of skin: Secondary | ICD-10-CM | POA: Diagnosis not present

## 2023-04-15 ENCOUNTER — Other Ambulatory Visit: Payer: Self-pay

## 2023-04-15 MED ORDER — METHYLPREDNISOLONE 4 MG PO TBPK
ORAL_TABLET | ORAL | 0 refills | Status: DC
  Filled 2023-04-15: qty 21, 6d supply, fill #0

## 2023-05-10 ENCOUNTER — Other Ambulatory Visit: Payer: Self-pay

## 2023-05-10 ENCOUNTER — Encounter: Payer: Self-pay | Admitting: Psychiatry

## 2023-05-10 ENCOUNTER — Telehealth (INDEPENDENT_AMBULATORY_CARE_PROVIDER_SITE_OTHER): Payer: 59 | Admitting: Psychiatry

## 2023-05-10 DIAGNOSIS — F900 Attention-deficit hyperactivity disorder, predominantly inattentive type: Secondary | ICD-10-CM | POA: Diagnosis not present

## 2023-05-10 DIAGNOSIS — F410 Panic disorder [episodic paroxysmal anxiety] without agoraphobia: Secondary | ICD-10-CM | POA: Diagnosis not present

## 2023-05-10 DIAGNOSIS — F40298 Other specified phobia: Secondary | ICD-10-CM | POA: Diagnosis not present

## 2023-05-10 DIAGNOSIS — F3342 Major depressive disorder, recurrent, in full remission: Secondary | ICD-10-CM | POA: Diagnosis not present

## 2023-05-10 MED ORDER — SERTRALINE HCL 50 MG PO TABS
50.0000 mg | ORAL_TABLET | Freq: Every day | ORAL | 1 refills | Status: DC
  Filled 2023-05-10: qty 30, 30d supply, fill #0

## 2023-05-10 NOTE — Progress Notes (Unsigned)
Virtual Visit via Video Note  I connected with Nonnie Done on 05/10/23 at  4:30 PM EDT by a video enabled telemedicine application and verified that I am speaking with the correct person using two identifiers.  Location Provider Location : Remote Office  Patient Location : Home  Participants: Patient , Provider    I discussed the limitations of evaluation and management by telemedicine and the availability of in person appointments. The patient expressed understanding and agreed to proceed.   I discussed the assessment and treatment plan with the patient. The patient was provided an opportunity to ask questions and all were answered. The patient agreed with the plan and demonstrated an understanding of the instructions.   The patient was advised to call back or seek an in-person evaluation if the symptoms worsen or if the condition fails to improve as anticipated.  [facility] MD OP Progress Note  05/10/2023 5:29 PM SHAWNDA DOVEL  MRN:  [id number]  Chief Complaint:  Chief Complaint  Patient presents with   Follow-up   Anxiety   Medication Refill   Depression   HPI: VIANNA LOUDEN is a 37 year old Caucasian female, employed, lives in [place], has a history of MDD in remission, panic attacks, gastroesophageal reflux disease, ADHD was evaluated by telemedicine today.  Patient today reports she is currently struggling with overwhelming anxiety mostly related to her job related stressors.  She reports she was able to get a new job and will start on August 12 with the same company.  She however will be working at a different location.  She looks forward to that.  She however reports she continues to struggle with chest pain, nervousness, worrying about things on a regular basis especially at the beginning of the day prior to going to work.  She is currently compliant on the Zoloft.  Agreeable to dosage increase.  She does have Xanax available which she has been taking as needed and may have  taken the Xanax in the last couple of days to target her anxiety symptoms and it helped.  Patient reports sleep as good.  Denies suicidality, homicidality or perceptual disturbances.  She does have a history of ADHD currently not on stimulants.  She used to follow-up with [facility] however she reports she is financially unable to afford that.  Agreeable to sign an ROI to obtain medical records from them.  Patient denies any other concerns today.  Visit Diagnosis:    ICD-10-CM   1. 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 MDD (major depressive disorder), recurrent, in full remission ([facility])  F33.42     4. Attention deficit hyperactivity disorder (ADHD), predominantly inattentive type  F90.0       Past Psychiatric History: I have reviewed past psychiatric history from progress note on 07/16/2020.  Past trials of medications-Wellbutrin, Lexapro-suicidality, Zoloft, Celexa, Prozac-headaches, Klonopin, Effexor.  Past Medical History:  Past Medical History:  Diagnosis Date   Anemia    when pregnant only   GERD (gastroesophageal reflux disease)     Past Surgical History:  Procedure Laterality Date   CHOLECYSTECTOMY N/A 06/03/2015   Procedure: LAPAROSCOPIC CHOLECYSTECTOMY WITH INTRAOPERATIVE CHOLANGIOGRAM;  Surgeon: Earline Mayotte, MD;  Location: [facility] ORS;  Service: General;  Laterality: N/A;   KNEE SURGERY Left 2011   arthroscopy   tubes in ears  baby    Family Psychiatric History: I have reviewed  family psychiatric history from progress note on 07/16/2020.  Family History:  Family History  Problem Relation Age of Onset   Hyperthyroidism Mother    Depression Mother    Lung cancer Father    Depression Father    Drug abuse Father    Lung cancer Paternal Uncle    Lung cancer Paternal Grandmother    Cervical cancer Sister    Depression Sister    Hypertension Brother    Bipolar disorder Sister      Social History: I have reviewed social history from progress note on 07/16/2020. Social History   Socioeconomic History   Marital status: Married    Spouse name: Not on file   Number of children: 3   Years of education: Not on file   Highest education level: Not on file  Occupational History   Occupation: pharmacy tech    Employer: [employer]  Tobacco Use   Smoking status: Former    Packs/day: 1.00    Years: 4.00    Additional pack years: 0.00    Total pack years: 4.00    Types: Cigarettes    Quit date: 05/30/2011    Years since quitting: 11.9   Smokeless tobacco: Never  Vaping Use   Vaping Use: Never used  Substance and Sexual Activity   Alcohol use: Not Currently    Alcohol/week: 0.0 standard drinks of alcohol   Drug use: Never   Sexual activity: Yes    Birth control/protection: None    Comment: Vasectomy in husband  Other Topics Concern   Not on file  Social History Narrative   Not on file   Social Determinants of Health   Financial Resource Strain: Not on file  Food Insecurity: Not on file  Transportation Needs: Not on file  Physical Activity: Not on file  Stress: Not on file  Social Connections: Not on file    Allergies:  Allergies  Allergen Reactions   Ceclor [Cefaclor] Hives   Septra [Sulfamethoxazole-Trimethoprim] Nausea And Vomiting   Tri-Sprintec [Norgestimate-Eth Estradiol] Other (See Comments)    Headaches, increased anxiety    Metabolic Disorder Labs: Lab Results  Component Value Date   HGBA1C 5.6 05/08/2020   No results found for: "PROLACTIN" Lab Results  Component Value Date   CHOL 181 05/08/2020   TRIG 64.0 05/08/2020   HDL 81.80 05/08/2020   CHOLHDL 2 05/08/2020   VLDL 12.8 05/08/2020   LDLCALC 86 05/08/2020   LDLCALC 100 (H) 03/29/2019   Lab Results  Component Value Date   TSH 0.92 05/08/2020   TSH 1.14 03/29/2019    Therapeutic Level Labs: No results found for: "LITHIUM" No results found for: "VALPROATE" No  results found for: "CBMZ"  Current Medications: Current Outpatient Medications  Medication Sig Dispense Refill   ALPRAZolam (XANAX) 0.25 MG tablet Take 1-2 tablets (0.25-0.5 mg total) by mouth daily as needed for anxiety. 12 tablet 0   cetirizine (ZYRTEC) 10 MG tablet Take 10 mg by mouth daily.     ferrous sulfate 325 (65 FE) MG EC tablet Take 1 tablet (325 mg total) by mouth daily. 30 tablet 1   methylPREDNISolone (MEDROL DOSEPAK) 4 MG TBPK tablet Follow package directions. 21 tablet 0   omeprazole (PRILOSEC) 20 MG capsule Take 1 capsule (20 mg total) by mouth once daily 90 capsule 1   sertraline (ZOLOFT) 50 MG tablet Take 1 tablet (50 mg total) by mouth daily. 30 tablet 1   scopolamine (TRANSDERM-SCOP) 1 MG/3DAYS Place 1 patch (1.5 mg total)  onto the skin every third day for 30 days (Patient not taking: Reported on 07/08/2022) 10 patch 0   No current facility-administered medications for this visit.     Musculoskeletal: Strength & Muscle Tone:  UTA Gait & Station:  Seated Patient leans: N/A  Psychiatric Specialty Exam: Review of Systems  Psychiatric/Behavioral:  The patient is nervous/anxious.     There were no vitals taken for this visit.There is no height or weight on file to calculate BMI.  General Appearance: Fairly Groomed  Eye Contact:  Fair  Speech:  Clear and Coherent  Volume:  Normal  Mood:  Anxious  Affect:  Congruent  Thought Process:  Goal Directed and Descriptions of Associations: Intact  Orientation:  Full (Time, Place, and Person)  Thought Content: Logical   Suicidal Thoughts:  No  Homicidal Thoughts:  No  Memory:  Immediate;   Fair Recent;   Good Remote;   Good  Judgement:  Good  Insight:  Good  Psychomotor Activity:  Normal  Concentration:  Concentration: Fair and Attention Span: Fair  Recall:  [employer] of Knowledge: Fair  Language: Fair  Akathisia:  No  Handed:  Right  AIMS (if indicated): not done  Assets:  Communication Skills Desire for  Improvement Social Support  ADL's:  Intact  Cognition: WNL  Sleep:  Fair   Screenings: AIMS    Flowsheet Row Video Visit from [facility]/25/2022 in [facility]  AIMS Total Score 0      GAD-7    Flowsheet Row Office Visit from 12/08/2022 in [facility] Video Visit from 03/25/2022 in [facility] Video Visit from 12/03/2021 in [facility] Video Visit from 06/05/2021 in [facility] Video Visit from 04/22/2021 in [facility]  Total GAD-7 Score 0 0 1 0 1      PHQ2-9    Flowsheet Row Office Visit from 12/08/2022 in [facility] Video Visit from 03/25/2022 in [facility] Counselor from 12/29/2021 in [facility] at [facility] Video Visit from 12/03/2021 in [facility] Video Visit from 06/05/2021 in [facility]  PHQ-2 Total Score 0 0 0 0 0  PHQ-9 Total Score 6 -- -- -- --      Flowsheet Row Office Visit from 12/08/2022 in [facility] Video Visit from 03/25/2022 in [facility] Counselor from 12/29/2021 in [facility] at [facility]  C-SSRS RISK CATEGORY No Risk No Risk No Risk        Assessment and Plan: ODYSSEY BALCERZAK is a 37 year old Caucasian female, employed, lives in [place], has a history of panic disorder, ADHD, MDD was evaluated by telemedicine today.  Patient is currently struggling with anxiety, does have multiple situational stressors including job-related stressors, will benefit from the following plan.  Plan Panic disorder-unstable Increase  Zoloft to 50 mg p.o. daily Xanax 0.25 mg daily as needed Patient to limit use.  Aware of long-term use of benzodiazepine therapy risks. Reviewed [place] PMP AWARxE  Specific phobia-flying-stable Xanax 0.25 mg as needed.  MDD in remission Will monitor closely  ADHD-unstable Patient did sign an ROI to obtain medical records from [facility].  Will consider adding a stimulant on nonstimulant.  Follow-up in clinic in 8  weeks or sooner if needed.   Collaboration of Care: Collaboration of Care: Other patient encouraged to sign an ROI to obtain medical records from [facility]-ADHD testing report as well as most recent progress notes.  Patient/Guardian was advised Release of Information must be obtained prior to any record release in order to collaborate their care with an outside provider. Patient/Guardian was advised if they have not already done so to contact the registration department to sign all necessary forms in order for [place] to release information regarding their care.   Consent: Patient/Guardian gives verbal consent for treatment and assignment of benefits for services provided during this visit. Patient/Guardian expressed understanding and agreed to proceed.   This note was generated in part or whole with voice recognition software. Voice recognition is usually quite accurate but there are transcription errors that can and very often do occur. I apologize for any typographical errors that were not detected and corrected.    Jomarie Longs, MD 05/10/2023, 5:29 PM

## 2023-05-27 ENCOUNTER — Other Ambulatory Visit: Payer: Self-pay

## 2023-06-01 DIAGNOSIS — G5601 Carpal tunnel syndrome, right upper limb: Secondary | ICD-10-CM | POA: Diagnosis not present

## 2023-06-06 ENCOUNTER — Other Ambulatory Visit (HOSPITAL_COMMUNITY): Payer: Self-pay | Admitting: Psychiatry

## 2023-06-06 ENCOUNTER — Other Ambulatory Visit: Payer: Self-pay

## 2023-06-06 DIAGNOSIS — F40298 Other specified phobia: Secondary | ICD-10-CM

## 2023-06-06 MED ORDER — ALPRAZOLAM 0.25 MG PO TABS
0.2500 mg | ORAL_TABLET | Freq: Every day | ORAL | 0 refills | Status: DC | PRN
  Filled 2023-06-06: qty 12, 6d supply, fill #0

## 2023-06-06 NOTE — Telephone Encounter (Signed)
sent 

## 2023-06-13 ENCOUNTER — Other Ambulatory Visit: Payer: Self-pay

## 2023-06-13 DIAGNOSIS — K219 Gastro-esophageal reflux disease without esophagitis: Secondary | ICD-10-CM | POA: Diagnosis not present

## 2023-06-13 DIAGNOSIS — F3342 Major depressive disorder, recurrent, in full remission: Secondary | ICD-10-CM | POA: Diagnosis not present

## 2023-06-13 DIAGNOSIS — Z0000 Encounter for general adult medical examination without abnormal findings: Secondary | ICD-10-CM | POA: Diagnosis not present

## 2023-06-13 DIAGNOSIS — G5601 Carpal tunnel syndrome, right upper limb: Secondary | ICD-10-CM | POA: Insufficient documentation

## 2023-06-13 DIAGNOSIS — E669 Obesity, unspecified: Secondary | ICD-10-CM | POA: Diagnosis not present

## 2023-06-13 MED ORDER — OMEPRAZOLE 20 MG PO CPDR
20.0000 mg | DELAYED_RELEASE_CAPSULE | Freq: Every day | ORAL | 1 refills | Status: DC
  Filled 2023-06-13: qty 90, 90d supply, fill #0

## 2023-06-28 ENCOUNTER — Other Ambulatory Visit: Payer: Self-pay

## 2023-06-28 ENCOUNTER — Telehealth: Payer: 59 | Admitting: Psychiatry

## 2023-06-28 ENCOUNTER — Encounter: Payer: Self-pay | Admitting: Psychiatry

## 2023-06-28 DIAGNOSIS — F40298 Other specified phobia: Secondary | ICD-10-CM

## 2023-06-28 DIAGNOSIS — F410 Panic disorder [episodic paroxysmal anxiety] without agoraphobia: Secondary | ICD-10-CM | POA: Diagnosis not present

## 2023-06-28 DIAGNOSIS — F3342 Major depressive disorder, recurrent, in full remission: Secondary | ICD-10-CM | POA: Diagnosis not present

## 2023-06-28 DIAGNOSIS — F900 Attention-deficit hyperactivity disorder, predominantly inattentive type: Secondary | ICD-10-CM

## 2023-06-28 MED ORDER — SERTRALINE HCL 50 MG PO TABS
50.0000 mg | ORAL_TABLET | Freq: Every day | ORAL | 0 refills | Status: DC
  Filled 2023-06-28: qty 90, 90d supply, fill #0

## 2023-06-28 NOTE — Progress Notes (Unsigned)
Virtual Visit via Video Note  I connected with Michele [place] on 06/28/23 at  8:30 AM EDT by a video enabled telemedicine application and verified that I am speaking with the correct person using two identifiers.  Location Provider Location : [facility] Patient Location : Work  Participants: Patient , Provider    I discussed the limitations of evaluation and management by telemedicine and the availability of in person appointments. The patient expressed understanding and agreed to proceed.   I discussed the assessment and treatment plan with the patient. The patient was provided an opportunity to ask questions and all were answered. The patient agreed with the plan and demonstrated an understanding of the instructions.   The patient was advised to call back or seek an in-person evaluation if the symptoms worsen or if the condition fails to improve as anticipated.    BH MD OP Progress Note  06/28/2023 8:59 AM Michele Houston  MRN:  [id number]  Chief Complaint:  Chief Complaint  Patient presents with   Follow-up   Anxiety   Depression   Medication Refill   HPI: Michele [place] is a 37 year old Caucasian female, employed, lives in [place], has a history of MDD in remission, panic attacks, gastroesophageal reflux disease, ADHD was evaluated by telemedicine today.  Patient today reports she is currently at her new job and that is working well.  After she is trained she will be able to work from home.  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  That does make her anxious although she is coping okay.  Patient reports she had dental procedure [place] beginning of August and continues to have complications from the same.  She reports part of her tongue as still numb.  That is frustrating.  She has upcoming appointment with a dentist.  Patient reports she is tolerating the Zoloft higher dosage well.  Denies  any sexual side effects.  Reports the medication is beneficial.  She would like to stay on this dosage for now.  Denies any significant sadness, anhedonia or lack of motivation.  Does have a history of attention problems, currently not on a stimulant.  Was under the care of [place] [facility] and agrees to request records.  Patient denies any suicidality, homicidality or perceptual disturbances.  Patient denies any other concerns today.  Visit Diagnosis:    ICD-10-CM   1. 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 MDD (major depressive disorder), recurrent, in full remission (HCC)  F33.42     4. Attention deficit hyperactivity disorder (ADHD), predominantly inattentive type  F90.0       Past Psychiatric History: I have reviewed past psychiatric history from progress note on 07/16/2020.  Past trials of medications-Wellbutrin, Lexapro-suicidality, Zoloft, Celexa, Prozac-headaches, Klonopin, Effexor.  Past Medical History:  Past Medical History:  Diagnosis Date   Anemia    when pregnant only   GERD (gastroesophageal reflux disease)     Past Surgical History:  Procedure Laterality Date   CHOLECYSTECTOMY N/A 06/03/2015   Procedure: LAPAROSCOPIC CHOLECYSTECTOMY WITH INTRAOPERATIVE CHOLANGIOGRAM;  Surgeon: Earline Mayotte, MD;  Location: [facility] ORS;  Service: General;  Laterality: N/A;   KNEE SURGERY Left 2011   arthroscopy   tubes in ears  baby    Family Psychiatric History: Reviewed family psychiatric  history from progress note on 07/16/2020.  Family History:  Family History  Problem Relation Age of Onset   Hyperthyroidism Mother    Depression Mother    Lung cancer Father    Depression Father    Drug abuse Father    Lung cancer Paternal Uncle    Lung cancer Paternal Grandmother    Cervical cancer Sister    Depression Sister    Hypertension Brother    Bipolar disorder Sister     Social History:  Reviewed social history from progress note on 07/16/2020. Social History   Socioeconomic History   Marital status: Married    Spouse name: Not on file   Number of children: 3   Years of education: Not on file   Highest education level: Not on file  Occupational History   Occupation: pharmacy tech    Employer: [employer]  Tobacco Use   Smoking status: Former    Current packs/day: 0.00    Average packs/day: 1 pack/day for 4.0 years (4.0 ttl pk-yrs)    Types: Cigarettes    Start date: 05/30/2007    Quit date: 05/30/2011    Years since quitting: 12.0   Smokeless tobacco: Never  Vaping Use   Vaping status: Never Used  Substance and Sexual Activity   Alcohol use: Not Currently    Alcohol/week: 0.0 standard drinks of alcohol   Drug use: Never   Sexual activity: Yes    Birth control/protection: None    Comment: Vasectomy in husband  Other Topics Concern   Not on file  Social History Narrative   Not on file   Social Determinants of Health   Financial Resource Strain: Low Risk  (06/13/2023)   Received from [facility]   Overall Financial Resource Strain (CARDIA)    Difficulty of Paying Living Expenses: Not hard at all  Food Insecurity: No Food Insecurity (06/13/2023)   Received from [facility]   Hunger Vital Sign    Worried About Running Out of Food in the Last Year: Never true    Ran Out of Food in the Last Year: Never true  Transportation Needs: No Transportation Needs (06/13/2023)   Received from [facility] - Transportation    In the past 12 months, has lack of transportation kept you from medical appointments or from getting medications?: No    Lack of Transportation (Non-Medical): No  Physical Activity: Not on file  Stress: Not on file  Social Connections: Not on file    Allergies:  Allergies  Allergen Reactions   Ceclor [Cefaclor] Hives   Septra [Sulfamethoxazole-Trimethoprim] Nausea And Vomiting    Tri-Sprintec [Norgestimate-Eth Estradiol] Other (See Comments)    Headaches, increased anxiety    Metabolic Disorder Labs: Lab Results  Component Value Date   HGBA1C 5.6 05/08/2020   No results found for: "PROLACTIN" Lab Results  Component Value Date   CHOL 181 05/08/2020   TRIG 64.0 05/08/2020   HDL 81.80 05/08/2020   CHOLHDL 2 05/08/2020   VLDL 12.8 05/08/2020   LDLCALC 86 05/08/2020   LDLCALC 100 (H) 03/29/2019   Lab Results  Component Value Date   TSH 0.92 05/08/2020   TSH 1.14 03/29/2019    Therapeutic Level Labs: No results found for: "LITHIUM" No results found for: "VALPROATE" No results found for: "CBMZ"  Current Medications: Current Outpatient Medications  Medication Sig Dispense Refill   ALPRAZolam (XANAX) 0.25 MG tablet Take 1-2 tablets (0.25-0.5 mg total) by mouth  daily as needed for anxiety. 12 tablet 0   cetirizine (ZYRTEC) 10 MG tablet Take 10 mg by mouth daily.     ferrous sulfate 325 (65 FE) MG EC tablet Take 1 tablet (325 mg total) by mouth daily. 30 tablet 1   omeprazole (PRILOSEC) 20 MG capsule Take 1 capsule (20 mg total) by mouth once daily 90 capsule 1   scopolamine (TRANSDERM-SCOP) 1 MG/3DAYS Place 1 patch (1.5 mg total) onto the skin every third day for 30 days 10 patch 0   sertraline (ZOLOFT) 50 MG tablet Take 1 tablet (50 mg total) by mouth daily. 90 tablet 0   No current facility-administered medications for this visit.     Musculoskeletal: Strength & Muscle Tone:  UTA Gait & Station:  Seated Patient leans: N/A  Psychiatric Specialty Exam: Review of Systems  Psychiatric/Behavioral:  The patient is nervous/anxious.     There were no vitals taken for this visit.There is no height or weight on file to calculate BMI.  General Appearance: Fairly Groomed  Eye Contact:  Fair  Speech:  Clear and Coherent  Volume:  Normal  Mood:  Anxious coping well  Affect:  Congruent  Thought Process:  Goal Directed and Descriptions of Associations:  Intact  Orientation:  Full (Time, Place, and Person)  Thought Content: Logical   Suicidal Thoughts:  No  Homicidal Thoughts:  No  Memory:  Immediate;   Fair Recent;   Fair Remote;   Fair  Judgement:  Fair  Insight:  Fair  Psychomotor Activity:  Normal  Concentration:  Concentration: Fair and Attention Span: Fair  Recall:  [employer] of Knowledge: Fair  Language: Fair  Akathisia:  No  Handed:  Right  AIMS (if indicated): not [place]  Assets:  Communication Skills Desire for Improvement Housing Social Support  ADL's:  Intact  Cognition: WNL  Sleep:  Fair   Screenings: AIMS    Flowsheet Row Video Visit from 03/25/2022 in [facility]  AIMS Total Score 0      GAD-7    Flowsheet Row Office Visit from 12/08/2022 in [facility] Video Visit from 03/25/2022 in [facility] Video Visit from 12/03/2021 in [facility] Video Visit from 06[facility]05/2021 in [facility] Video Visit from 04/22/2021 in [facility]  Total GAD-7 Score 0 0 1 0 1      PHQ2-9    Flowsheet Row Office Visit from 12/08/2022 in [facility] Video Visit from 03/25/2022 in [facility] Counselor from 12/29/2021 in [facility] Video Visit from 12/03/2021 in [facility] Video Visit from 06/05/2021 in [facility]  PHQ-2 Total Score 0 0 0 0 0  PHQ-9 Total Score 6 -- -- -- --      Flowsheet Row Video Visit from 06/28/2023 in [facility] Video Visit from 05/10/2023 in [facility] Office Visit from 12/08/2022 in [facility]  C-SSRS RISK CATEGORY No Risk No Risk No Risk        Assessment and Plan: Michele [place] is a 37 year old Caucasian female, employed, lives in [place], has a history of panic disorder, ADHD, MDD was evaluated by telemedicine today.  Patient with ongoing anxiety mostly situational  as well as comorbid medical problems, status post complications due to a dental procedure, currently however reports good response to the higher dosage of Zoloft.  Discussed plan as noted below.  Plan Panic disorder-improving Zoloft 50 mg p.o. daily Xanax 0.25 mg p.o. daily as needed Reviewed [place] PMP AWARxE  Specific phobia-flying-stable Xanax 0.25 mg as needed  MDD in remission Zoloft 50 mg daily  ADHD-unstable Patient to sign an ROI to obtain medical records from [place] [facility]-pending Will consider stimulant or nonstimulant medications once I review records.  Follow-up in clinic in 3 months or sooner if needed.  Collaboration of Care: Collaboration of Care: Other patient agrees to sign an ROI to obtain medical records and coordinate care with [place] [facility].  Patient/Guardian was advised Release of Information must be obtained prior to any record release in order to collaborate their care with an outside provider. Patient/Guardian was advised if they have not already [place] so to contact the registration department to sign all necessary forms in order for [place] to release information regarding their care.   Consent: Patient/Guardian gives verbal consent for treatment and assignment of benefits for services provided during this visit. Patient/Guardian expressed understanding and agreed to proceed.   This note was generated in part or whole with voice recognition software. Voice recognition is usually quite accurate but there are transcription errors that can and very often do occur. I apologize for any typographical errors that were  not detected and corrected.    Jomarie Longs, MD 06/29/2023, 8:58 AM

## 2023-06-29 ENCOUNTER — Telehealth: Payer: Self-pay | Admitting: Psychiatry

## 2023-06-29 NOTE — Telephone Encounter (Signed)
Noted  

## 2023-06-29 NOTE — Telephone Encounter (Signed)
Per provider request have patient sign ROI to obtain records from [facility]. Patient mailbox full-ROI mailed reqesting to fill out and return so I can send for records.

## 2023-07-06 ENCOUNTER — Telehealth: Payer: 59 | Admitting: Family Medicine

## 2023-07-06 ENCOUNTER — Other Ambulatory Visit: Payer: Self-pay

## 2023-07-06 DIAGNOSIS — M549 Dorsalgia, unspecified: Secondary | ICD-10-CM | POA: Diagnosis not present

## 2023-07-06 MED ORDER — CYCLOBENZAPRINE HCL 10 MG PO TABS
10.0000 mg | ORAL_TABLET | Freq: Three times a day (TID) | ORAL | 0 refills | Status: DC | PRN
Start: 2023-07-06 — End: 2023-10-20
  Filled 2023-07-06: qty 30, 10d supply, fill #0

## 2023-07-06 MED ORDER — NAPROXEN 500 MG PO TABS
500.0000 mg | ORAL_TABLET | Freq: Two times a day (BID) | ORAL | 0 refills | Status: DC
Start: 2023-07-06 — End: 2024-01-31
  Filled 2023-07-06: qty 30, 15d supply, fill #0

## 2023-07-06 NOTE — Progress Notes (Signed)

## 2023-07-25 ENCOUNTER — Encounter (INDEPENDENT_AMBULATORY_CARE_PROVIDER_SITE_OTHER): Payer: 59

## 2023-07-25 DIAGNOSIS — F410 Panic disorder [episodic paroxysmal anxiety] without agoraphobia: Secondary | ICD-10-CM | POA: Diagnosis not present

## 2023-07-26 ENCOUNTER — Other Ambulatory Visit: Payer: Self-pay

## 2023-07-26 MED ORDER — BUSPIRONE HCL 5 MG PO TABS
5.0000 mg | ORAL_TABLET | Freq: Three times a day (TID) | ORAL | 0 refills | Status: DC
Start: 2023-07-26 — End: 2023-11-24
  Filled 2023-07-26: qty 90, 30d supply, fill #0

## 2023-07-26 NOTE — Telephone Encounter (Signed)
Will start BuSpar 5 mg 3 times daily.  I have sent the medication to pharmacy.  Patient okay to reduce the Zoloft to 25 mg daily if she believes that Zoloft increased to 50 mg is causing her to be more anxious.  Patient will definitely benefit from psychotherapy and will encourage her to establish care with a therapist.   I have spent at least 8 minutes non face to face with patient today.

## 2023-07-29 DIAGNOSIS — Z0000 Encounter for general adult medical examination without abnormal findings: Secondary | ICD-10-CM | POA: Diagnosis not present

## 2023-07-29 DIAGNOSIS — R399 Unspecified symptoms and signs involving the genitourinary system: Secondary | ICD-10-CM | POA: Diagnosis not present

## 2023-09-03 ENCOUNTER — Other Ambulatory Visit (HOSPITAL_COMMUNITY): Payer: Self-pay | Admitting: Psychiatry

## 2023-09-03 DIAGNOSIS — F40298 Other specified phobia: Secondary | ICD-10-CM

## 2023-09-04 ENCOUNTER — Other Ambulatory Visit: Payer: Self-pay

## 2023-09-04 ENCOUNTER — Other Ambulatory Visit (HOSPITAL_COMMUNITY): Payer: Self-pay | Admitting: Psychiatry

## 2023-09-04 DIAGNOSIS — F40298 Other specified phobia: Secondary | ICD-10-CM

## 2023-09-05 ENCOUNTER — Other Ambulatory Visit: Payer: Self-pay

## 2023-09-05 MED ORDER — ALPRAZOLAM 0.25 MG PO TABS
0.2500 mg | ORAL_TABLET | Freq: Every day | ORAL | 0 refills | Status: DC | PRN
  Filled 2023-09-05: qty 12, 6d supply, fill #0

## 2023-09-06 ENCOUNTER — Other Ambulatory Visit: Payer: Self-pay

## 2023-09-12 ENCOUNTER — Telehealth: Payer: 59 | Admitting: Physician Assistant

## 2023-09-12 DIAGNOSIS — B9689 Other specified bacterial agents as the cause of diseases classified elsewhere: Secondary | ICD-10-CM

## 2023-09-12 DIAGNOSIS — J019 Acute sinusitis, unspecified: Secondary | ICD-10-CM

## 2023-09-12 MED ORDER — DOXYCYCLINE HYCLATE 100 MG PO TABS: 100.0000 mg | ORAL_TABLET | Freq: Two times a day (BID) | ORAL | 0 refills | Status: DC

## 2023-09-12 NOTE — Progress Notes (Signed)

## 2023-09-21 ENCOUNTER — Other Ambulatory Visit: Payer: Self-pay

## 2023-09-21 ENCOUNTER — Other Ambulatory Visit (HOSPITAL_COMMUNITY): Payer: Self-pay

## 2023-09-22 ENCOUNTER — Other Ambulatory Visit (HOSPITAL_BASED_OUTPATIENT_CLINIC_OR_DEPARTMENT_OTHER): Payer: Self-pay

## 2023-09-22 ENCOUNTER — Other Ambulatory Visit: Payer: Self-pay

## 2023-09-22 ENCOUNTER — Other Ambulatory Visit (HOSPITAL_COMMUNITY): Payer: Self-pay

## 2023-09-22 MED ORDER — OMEPRAZOLE 20 MG PO CPDR
20.0000 mg | DELAYED_RELEASE_CAPSULE | Freq: Every day | ORAL | 1 refills | Status: AC
  Filled 2023-09-22 (×2): qty 90, 90d supply, fill #0

## 2023-10-20 ENCOUNTER — Encounter: Payer: Self-pay | Admitting: Psychiatry

## 2023-10-20 ENCOUNTER — Telehealth: Payer: 59 | Admitting: Psychiatry

## 2023-10-20 ENCOUNTER — Other Ambulatory Visit (HOSPITAL_COMMUNITY): Payer: Self-pay

## 2023-10-20 DIAGNOSIS — F3342 Major depressive disorder, recurrent, in full remission: Secondary | ICD-10-CM

## 2023-10-20 DIAGNOSIS — F40243 Fear of flying: Secondary | ICD-10-CM | POA: Diagnosis not present

## 2023-10-20 DIAGNOSIS — F410 Panic disorder [episodic paroxysmal anxiety] without agoraphobia: Secondary | ICD-10-CM | POA: Diagnosis not present

## 2023-10-20 DIAGNOSIS — F900 Attention-deficit hyperactivity disorder, predominantly inattentive type: Secondary | ICD-10-CM | POA: Diagnosis not present

## 2023-10-20 DIAGNOSIS — F40298 Other specified phobia: Secondary | ICD-10-CM

## 2023-10-20 MED ORDER — DULOXETINE HCL 20 MG PO CPEP
20.0000 mg | ORAL_CAPSULE | Freq: Every day | ORAL | 1 refills | Status: DC
  Filled 2023-10-20: qty 30, 30d supply, fill #0

## 2023-10-20 NOTE — Progress Notes (Signed)
Virtual Visit via Video Note  I connected with Michele Houston on 10/20/23 at  3:30 PM EST by a video enabled telemedicine application and verified that I am speaking with the correct person using two identifiers.  Location Provider Location : [facility] Patient Location : Work  Participants: Patient , Provider    I discussed the limitations of evaluation and management by telemedicine and the availability of in person appointments. The patient expressed understanding and agreed to proceed.   I discussed the assessment and treatment plan with the patient. The patient was provided an opportunity to ask questions and all were answered. The patient agreed with the plan and demonstrated an understanding of the instructions.   The patient was advised to call back or seek an in-person evaluation if the symptoms worsen or if the condition fails to improve as anticipated.    BH MD OP Progress Note  10/21/2023 8:22 AM Michele Houston  MRN:  [id number]  Chief Complaint:  Chief Complaint  Patient presents with   Follow-up   Anxiety   Depression   Medication Refill   HPI: Michele Houston is a 37 year old Caucasian female, employed, lives in [place], has a history of MDD, panic attacks, gastroesophageal reflux disease, ADHD was evaluated by telemedicine today.  Patient reports recent worsening of anxiety symptoms, feeling on edge and overwhelmed.  Patient reports no situational stressors that could be causing her anxiety and does not know why she feels this way.  She does not feel depressed.  She believes the sertraline definitely helped with the depression symptoms, not so much with the anxiety.  She was prescribed BuSpar 5 mg 3 times a day however she did not take it regularly.  She reports she took it as needed and it did not help.  She is interested in a change of medication today to address her anxiety symptoms.  She would like to come off of the sertraline since she is also worried about it  causing a lot of weight gain.  Sertraline at higher dosages caused other side effects to.  Since dosage increase is not possible to address her mood symptoms.  Patient reports sleep is overall good.  She however reports in spite of that she feels tired during the day.  Patient reports work is going well.  She is currently at a new job and she enjoys it.  She is planning to take a couple of days off for Christmas and looks forward to spending it with family.  Patient currently denies any suicidality, homicidality or perceptual disturbances.  Patient denies any other concerns today.  Visit Diagnosis:    ICD-10-CM   1. 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 MDD (major depressive disorder), recurrent, in full remission (HCC)  F33.42     4. Attention deficit hyperactivity disorder (ADHD), predominantly inattentive type  F90.0       Past Psychiatric History: I have reviewed past psychiatric history from progress note on 07/16/2020.  Past trials of medications-Wellbutrin, Lexapro-suicidality, Zoloft, Celexa, Prozac-headaches, Klonopin, Effexor.  Past Medical History:  Past Medical History:  Diagnosis Date   Anemia    when pregnant only   GERD (gastroesophageal reflux disease)     Past Surgical History:  Procedure Laterality Date   CHOLECYSTECTOMY N/A 06/03/2015   Procedure: LAPAROSCOPIC CHOLECYSTECTOMY WITH INTRAOPERATIVE CHOLANGIOGRAM;  Surgeon: Earline Mayotte, MD;  Location: [facility] ORS;  Service: General;  Laterality: N/A;   KNEE SURGERY Left 2011   arthroscopy   tubes in ears  baby    Family Psychiatric History: I have reviewed family psychiatric history from progress note on 07/16/2020.  Family History:  Family History  Problem Relation Age of Onset   Hyperthyroidism Mother    Depression Mother    Lung cancer Father    Depression Father    Drug abuse Father    Lung cancer Paternal  Uncle    Lung cancer Paternal Grandmother    Cervical cancer Sister    Depression Sister    Hypertension Brother    Bipolar disorder Sister     Social History: I have reviewed social history from progress note on 07/16/2020. Social History   Socioeconomic History   Marital status: Married    Spouse name: Not on file   Number of children: 3   Years of education: Not on file   Highest education level: Not on file  Occupational History   Occupation: pharmacy tech    Employer: [employer]  Tobacco Use   Smoking status: Former    Current packs/day: 0.00    Average packs/day: 1 pack/day for 4.0 years (4.0 ttl pk-yrs)    Types: Cigarettes    Start date: 05/30/2007    Quit date: 05/30/2011    Years since quitting: 12.4   Smokeless tobacco: Never  Vaping Use   Vaping status: Never Used  Substance and Sexual Activity   Alcohol use: Not Currently    Alcohol/week: 0.0 standard drinks of alcohol   Drug use: Never   Sexual activity: Yes    Birth control/protection: None    Comment: Vasectomy in husband  Other Topics Concern   Not on file  Social History Narrative   Not on file   Social Drivers of Health   Financial Resource Strain: Low Risk  (06/13/2023)   Received from [facility]   Overall Financial Resource Strain (CARDIA)    Difficulty of Paying Living Expenses: Not hard at all  Food Insecurity: No Food Insecurity (06/13/2023)   Received from [facility]   Hunger Vital Sign    Worried About Running Out of Food in the Last Year: Never true    Ran Out of Food in the Last Year: Never true  Transportation Needs: No Transportation Needs (06/13/2023)   Received from [facility]    In the past 12 months, has lack of transportation kept you from medical appointments or from getting medications?: No    Lack of Transportation (Non-Medical): No  Physical Activity: Not on file  Stress: Not on file   Social Connections: Not on file    Allergies:  Allergies  Allergen Reactions   Ceclor [Cefaclor] Hives   Septra [Sulfamethoxazole-Trimethoprim] Nausea And Vomiting   Sulfamethoxazole    Trimethoprim     Metabolic Disorder Labs: Lab Results  Component Value Date   HGBA1C 5.6 05/08/2020   No results found for: "PROLACTIN" Lab Results  Component Value Date   CHOL 181 05/08/2020   TRIG 64.0 05/08/2020   HDL 81.80 05/08/2020   CHOLHDL 2 05/08/2020   VLDL 12.8 05/08/2020   LDLCALC 86 05/08/2020   LDLCALC 100 (H) 03/29/2019   Lab Results  Component Value Date   TSH 0.92 05/08/2020   TSH 1.14 03/29/2019    Therapeutic Level Labs: No results found for: "LITHIUM" No results found for: "VALPROATE" No results found  for: "CBMZ"  Current Medications: Current Outpatient Medications  Medication Sig Dispense Refill   ALPRAZolam (XANAX) 0.25 MG tablet Take 1-2 tablets (0.25-0.5 mg total) by mouth daily as needed for anxiety. 12 tablet 0   cetirizine (ZYRTEC) 10 MG tablet Take 10 mg by mouth daily.     DULoxetine (CYMBALTA) 20 MG capsule Take 1 capsule (20 mg total) by mouth daily. Stop sertraline. 30 capsule 1   ferrous sulfate 325 (65 FE) MG EC tablet Take 1 tablet (325 mg total) by mouth daily. 30 tablet 1   naproxen (NAPROSYN) 500 MG tablet Take 1 tablet (500 mg total) by mouth 2 (two) times daily with a meal. 30 tablet 0   omeprazole (PRILOSEC) 20 MG capsule Take 1 capsule (20 mg total) by mouth daily. 90 capsule 1   busPIRone (BUSPAR) 5 MG tablet Take 1 tablet (5 mg total) by mouth 3 (three) times daily. (Patient not taking: Reported on 10/20/2023) 90 tablet 0   No current facility-administered medications for this visit.     Musculoskeletal: Strength & Muscle Tone:  UTA Gait & Station:  Seated Patient leans: N/A  Psychiatric Specialty Exam: Review of Systems  Constitutional:  Positive for fatigue.  Psychiatric/Behavioral:  The patient is nervous/anxious.      There were no vitals taken for this visit.There is no height or weight on file to calculate BMI.  General Appearance: Fairly Groomed  Eye Contact:  Fair  Speech:  Clear and Coherent  Volume:  Normal  Mood:  Anxious  Affect:  Appropriate  Thought Process:  Goal Directed and Descriptions of Associations: Intact  Orientation:  Full (Time, Place, and Person)  Thought Content: Logical   Suicidal Thoughts:  No  Homicidal Thoughts:  No  Memory:  Immediate;   Fair Recent;   Fair Remote;   Fair  Judgement:  Fair  Insight:  Fair  Psychomotor Activity:  Normal  Concentration:  Concentration: Fair and Attention Span: Fair  Recall:  [employer] of Knowledge: Fair  Language: Fair  Akathisia:  No  Handed:  Right  AIMS (if indicated): not done  Assets:  Communication Skills Desire for Improvement Housing Social Support  ADL's:  Intact  Cognition: WNL  Sleep:  Fair   Screenings: AIMS    Flowsheet Row Video Visit from 03/25/2022 in [facility]  AIMS Total Score 0      GAD-7    Flowsheet Row Office Visit from 12/08/2022 in [facility] Video Visit from 03/25/2022 in [facility] Video Visit from 12/03/2021 in [facility] Video Visit from 06/05/2021 in [facility] Video Visit from 04/22/2021 in [facility]  Total GAD-7 Score 0 0 1 0 1      PHQ2-9    Flowsheet Row Office Visit from 12/08/2022 in [facility] Video Visit from 03/25/2022 in [facility] Counselor from 12/29/2021 in [facility] Video Visit from 12/03/2021 in [facility] Video Visit from 06/05/2021 in [facility]  PHQ-2 Total Score 0 0 0 0 0  PHQ-9 Total Score 6 -- -- -- --      Flowsheet Row Video Visit from 10/20/2023 in [facility] Video Visit from 06/28/2023 in [facility] Video  Visit from 05/10/2023 in [facility]  C-SSRS RISK CATEGORY No Risk No Risk No Risk        Assessment and Plan: TUWANDA MEAKER is a 37 year old Caucasian female, employed, lives in [place], has a history of panic disorder, ADHD, MDD was evaluated by telemedicine today.  Patient with worsening anxiety symptoms, possible side effects to sertraline, will benefit from medication readjustment, assessment and plan as noted below.  Panic disorder-unstable Patient continues to have significant anxiety symptoms and sertraline has not been effective. -Taper off sertraline, patient to start taking 25 mg every other day for 10 doses and stop taking it. - Once completely stopped taking the sertraline could start Cymbalta 20 mg daily.  We will consider readjusting the dosage gradually. - Continue Xanax 0.25 mg as needed.  Patient to limit use. - Patient currently noncompliant with BuSpar 5 mg 3 times daily.  Could stay on BuSpar if interested otherwise hold it for now.   Specific phobia of flying-stable No issues reported today. -Continue Xanax 0.25 mg as needed.  MDD in remission Patient notes depression currently in remission. -Taper of sertraline for side effects of weight gain. - Start Cymbalta as noted above.  ADHD per history Patient did not report any concerns about ADHD symptoms.  Used to be under the care of [facility]. Previously was advised to request medical records-pending. Could consider nonstimulant or stimulant medication as needed once records reviewed.  Follow-up in clinic in 6 weeks or sooner in person.  Consent: Patient/Guardian gives  verbal consent for treatment and assignment of benefits for services provided during this visit. Patient/Guardian expressed understanding and agreed to proceed.   This note was generated in part or whole with voice recognition software. Voice recognition is usually quite accurate but there are transcription errors that can and very often do occur. I apologize for any typographical errors that were not detected and corrected.     Jomarie Longs, MD 10/21/2023, 8:22 AM

## 2023-10-20 NOTE — Patient Instructions (Signed)
Duloxetine Delayed-Release Capsules What is this medication? DULOXETINE (doo LOX e teen) treats depression, anxiety, fibromyalgia, and certain types of chronic pain such as nerve, bone, or joint pain. It increases the amount of serotonin and norepinephrine in the brain, hormones that help regulate mood and pain. It belongs to a group of medications called SNRIs. This medicine may be used for other purposes; ask your health care provider or pharmacist if you have questions. COMMON BRAND NAME(S): Cymbalta, Drizalma, Irenka What should I tell my care team before I take this medication? They need to know if you have any of these conditions: Bipolar disorder Glaucoma High blood pressure Kidney disease Liver disease Seizures Suicidal thoughts, plans, or attempt by you or a family member Take medications that treat or prevent blood clots Taken an MAOI, such as Carbex, Eldepryl, Marplan, Nardil, or Parnate in the last 14 days Trouble passing urine An unusual reaction to duloxetine, other medications, foods, dyes, or preservatives Pregnant or trying to get pregnant Breastfeeding How should I use this medication? Take this medication by mouth with water. Take it as directed on the prescription label at the same time every day. Do not cut, crush, or chew this medication. Swallow the capsules whole. Some capsules may be opened and sprinkled on applesauce. Check with your care team or pharmacist if you are not sure. You can take this medication with or without food. Do not take your medication more often than directed. Do not stop taking this medication suddenly except upon the advice of your care team. Stopping this medication too quickly may cause serious side effects or your condition may worsen. A special MedGuide will be given to you by the pharmacist with each prescription and refill. Be sure to read this information carefully each time. Talk to your care team about the use of this medication in  children. While it may be prescribed for children as young as 7 years for selected conditions, precautions do apply. Overdosage: If you think you have taken too much of this medicine contact a poison control center or emergency room at once. NOTE: This medicine is only for you. Do not share this medicine with others. What if I miss a dose? If you miss a dose, take it as soon as you can. If it is almost time for your next dose, take only that dose. Do not take double or extra doses. What may interact with this medication? Do not take this medication with any of the following: Desvenlafaxine Levomilnacipran Linezolid MAOIs, such as Carbex, Eldepryl, Emsam, Marplan, Nardil, and Parnate Methylene blue (injected into a vein) Milnacipran Safinamide Thioridazine Venlafaxine Viloxazine This medication may also interact with the following: Alcohol Amphetamines Aspirin and aspirin-like medications Certain antibiotics, such as ciprofloxacin and enoxacin Certain medications for blood pressure, heart disease, irregular heart beat Certain medications for mental health conditions Certain medications for migraine headache, such as almotriptan, eletriptan, frovatriptan, naratriptan, rizatriptan, sumatriptan, zolmitriptan Certain medications that treat or prevent blood clots, such as warfarin, enoxaparin, and dalteparin Cimetidine Fentanyl Lithium NSAIDS, medications for pain and inflammation, such as ibuprofen or naproxen Phentermine Procarbazine Rasagiline Sibutramine St. John's Wort Theophylline Tramadol Tryptophan This list may not describe all possible interactions. Give your health care provider a list of all the medicines, herbs, non-prescription drugs, or dietary supplements you use. Also tell them if you smoke, drink alcohol, or use illegal drugs. Some items may interact with your medicine. What should I watch for while using this medication? Tell your care team if your  symptoms do not  get better or if they get worse. Visit your care team for regular checks on your progress. Because it may take several weeks to see the full effects of this medication, it is important to continue your treatment as prescribed by your care team. This medication may cause serious skin reactions. They can happen weeks to months after starting the medication. Contact your care team right away if you notice fevers or flu-like symptoms with a rash. The rash may be red or purple and then turn into blisters or peeling of the skin. You may also notice a red rash with swelling of the face, lips, or lymph nodes in your neck or under your arms. Watch for new or worsening thoughts of suicide or depression. This includes sudden changes in mood, behaviors, or thoughts. These changes can happen at any time but are more common in the beginning of treatment or after a change in dose. Call your care team right away if you experience these thoughts or worsening depression. This medication may cause mood and behavior changes, such as anxiety, nervousness, irritability, hostility, restlessness, excitability, hyperactivity, or trouble sleeping. These changes can happen at any time but are more common in the beginning of treatment or after a change in dose. Call your care team right away if you notice any of these symptoms. This medication may affect your coordination, reaction time, or judgment. Do not drive or operate machinery until you know how this medication affects you. Sit up or stand slowly to reduce the risk of dizzy or fainting spells. Drinking alcohol with this medication can increase the risk of these side effects. This medication may increase blood sugar. The risk may be higher in patients who already have diabetes. Ask your care team what you can do to lower your risk of diabetes while taking this medication. This medication can cause an increase in blood pressure. This medication can also cause a sudden drop in your  blood pressure, which may make you feel faint and increase the chance of a fall. These effects are most common when you first start the medication or when the dose is increased, or during use of other medications that can cause a sudden drop in blood pressure. Check with your care team for instructions on monitoring your blood pressure while taking this medication. Your mouth may get dry. Chewing sugarless gum or sucking hard candy and drinking plenty of water may help. Contact your care team if the problem does not go away or is severe. What side effects may I notice from receiving this medication? Side effects that you should report to your care team as soon as possible: Allergic reactions--skin rash, itching, hives, swelling of the face, lips, tongue, or throat Bleeding--bloody or black, tar-like stools, red or dark brown urine, vomiting blood or brown material that looks like coffee grounds, small, red or purple spots on skin, unusual bleeding or bruising Increase in blood pressure Liver injury--right upper belly pain, loss of appetite, nausea, light-colored stool, dark yellow or brown urine, yellowing skin or eyes, unusual weakness or fatigue Low sodium level--muscle weakness, fatigue, dizziness, headache, confusion Redness, blistering, peeling, or loosening of the skin, including inside the mouth Serotonin syndrome--irritability, confusion, fast or irregular heartbeat, muscle stiffness, twitching muscles, sweating, high fever, seizures, chills, vomiting, diarrhea Sudden eye pain or change in vision such as blurry vision, seeing halos around lights, vision loss Thoughts of suicide or self-harm, worsening mood, feelings of depression Trouble passing urine Side effects that  usually do not require medical attention (report to your care team if they continue or are bothersome): Change in sex drive or performance Constipation Diarrhea Dizziness Dry mouth Excessive sweating Loss of  appetite Nausea Vomiting This list may not describe all possible side effects. Call your doctor for medical advice about side effects. You may report side effects to FDA at 1-800-FDA-1088. Where should I keep my medication? Keep out of the reach of children and pets. Store at room temperature between 15 and 30 degrees C (59 to 86 degrees F). Get rid of any unused medication after the expiration date. To get rid of medications that are no longer needed or have expired: Take the medication to a medication take-back program. Check with your pharmacy or law enforcement to find a location. If you cannot return the medication, check the label or package insert to see if the medication should be thrown out in the garbage or flushed down the toilet. If you are not sure, ask your care team. If it is safe to put it in the trash, take the medication out of the container. Mix the medication with cat litter, dirt, coffee grounds, or other unwanted substance. Seal the mixture in a bag or container. Put it in the trash. NOTE: This sheet is a summary. It may not cover all possible information. If you have questions about this medicine, talk to your doctor, pharmacist, or health care provider.  2024 Elsevier/Gold Standard (2022-07-22 00:00:00)

## 2023-10-29 ENCOUNTER — Other Ambulatory Visit (HOSPITAL_COMMUNITY): Payer: Self-pay

## 2023-11-07 ENCOUNTER — Telehealth: Payer: Self-pay | Admitting: Psychiatry

## 2023-11-07 NOTE — Telephone Encounter (Signed)
 I have reviewed medical records received from [facility]-Dr. Amy Stevenson-06/23/2020-01/11/2022  Patient diagnosed with adult ADHD Past trials of medications like Vyvanse 30 mg,

## 2023-11-16 DIAGNOSIS — F40298 Other specified phobia: Secondary | ICD-10-CM

## 2023-11-17 MED ORDER — ALPRAZOLAM 0.25 MG PO TABS
0.2500 mg | ORAL_TABLET | Freq: Every day | ORAL | 2 refills | Status: DC | PRN
  Filled 2023-11-17: qty 12, 6d supply, fill #0

## 2023-11-18 ENCOUNTER — Other Ambulatory Visit (HOSPITAL_COMMUNITY): Payer: Self-pay

## 2023-11-21 NOTE — Telephone Encounter (Signed)
FYI

## 2023-11-24 ENCOUNTER — Encounter: Payer: Self-pay | Admitting: Psychiatry

## 2023-11-24 ENCOUNTER — Telehealth: Payer: 59 | Admitting: Psychiatry

## 2023-11-24 DIAGNOSIS — F40298 Other specified phobia: Secondary | ICD-10-CM

## 2023-11-24 DIAGNOSIS — F3342 Major depressive disorder, recurrent, in full remission: Secondary | ICD-10-CM | POA: Diagnosis not present

## 2023-11-24 DIAGNOSIS — F900 Attention-deficit hyperactivity disorder, predominantly inattentive type: Secondary | ICD-10-CM

## 2023-11-24 DIAGNOSIS — F410 Panic disorder [episodic paroxysmal anxiety] without agoraphobia: Secondary | ICD-10-CM | POA: Diagnosis not present

## 2023-11-24 NOTE — Progress Notes (Signed)
Virtual Visit via Video Note  I connected with Michele Houston on 11/24/23 at  9:30 AM EST by a video enabled telemedicine application and verified that I am speaking with the correct person using two identifiers.  Location Provider Location : [facility] Patient Location : Home  Participants: Patient , Provider    I discussed the limitations of evaluation and management by telemedicine and the availability of in person appointments. The patient expressed understanding and agreed to proceed.    I discussed the assessment and treatment plan with the patient. The patient was provided an opportunity to ask questions and all were answered. The patient agreed with the plan and demonstrated an understanding of the instructions.   The patient was advised to call back or seek an in-person evaluation if the symptoms worsen or if the condition fails to improve as anticipated.   BH MD OP Progress Note  11/24/2023 7:17 PM Michele Houston  MRN:  [id number]  Chief Complaint:  Chief Complaint  Patient presents with   Follow-up   Anxiety   Medication Refill   HPI: Michele Houston is a 38 year old Caucasian female, employed, lives in [place], has a history of MDD, panic attacks, gastroesophageal reflux disease, ADHD was evaluated by telemedicine today.  The patient has a history of anxiety and depression, previously managed with sertraline and Buspar. She recently discontinued both medications and reports feeling well with minimal anxiety and no depressive symptoms. She has not initiated the newly prescribed Cymbalta due to concerns about potential side effects. She continues to have Xanax available for emergency use, particularly for anticipated anxiety related to an upcoming flight.  The patient has a history of ADHD and has previously discussed the possibility of restarting stimulant medication. However, she has not pursued this further at this time.  The patient works from home and has a busy home  life, including caring for children. She is planning to travel in February for a family wedding.  Patient currently denies any suicidality, homicidality or perceptual disturbances.  Patient denies any other concerns today.  Visit Diagnosis:    ICD-10-CM   1. 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 MDD (major depressive disorder), recurrent, in full remission (HCC)  F33.42     4. Attention deficit hyperactivity disorder (ADHD), predominantly inattentive type  F90.0       Past Psychiatric History: I have reviewed past psychiatric history from progress note on 07/16/2020.  Past trials of medications-Wellbutrin, Lexapro-suicidality, Zoloft, Celexa, Prozac-headaches, Klonopin, Effexor.  Past Medical History:  Past Medical History:  Diagnosis Date   Anemia    when pregnant only   GERD (gastroesophageal reflux disease)     Past Surgical History:  Procedure Laterality Date   CHOLECYSTECTOMY N/A 06/03/2015   Procedure: LAPAROSCOPIC CHOLECYSTECTOMY WITH INTRAOPERATIVE CHOLANGIOGRAM;  Surgeon: Earline Mayotte, MD;  Location: [facility] ORS;  Service: General;  Laterality: N/A;   KNEE SURGERY Left 2011   arthroscopy   tubes in ears  baby    Family Psychiatric History: I have reviewed family psychiatric history from progress note on 07/16/2020.  Family History:  Family History  Problem Relation Age of Onset   Hyperthyroidism Mother    Depression Mother    Lung cancer Father    Depression Father    Drug abuse Father    Lung cancer Paternal Uncle    Lung cancer Paternal Grandmother    Cervical cancer Sister  Depression Sister    Hypertension Brother    Bipolar disorder Sister     Social History: I have reviewed social history from progress note on 07/16/2020. Social History   Socioeconomic History   Marital status: Married    Spouse name: Not on file   Number of children: 3   Years of education: Not on file   Highest education level: Not on file   Occupational History   Occupation: pharmacy tech    Employer: [employer]  Tobacco Use   Smoking status: Former    Current packs/day: 0.00    Average packs/day: 1 pack/day for 4.0 years (4.0 ttl pk-yrs)    Types: Cigarettes    Start date: 05/30/2007    Quit date: 05/30/2011    Years since quitting: 12.4   Smokeless tobacco: Never  Vaping Use   Vaping status: Never Used  Substance and Sexual Activity   Alcohol use: Not Currently    Alcohol/week: 0.0 standard drinks of alcohol   Drug use: Never   Sexual activity: Yes    Birth control/protection: None    Comment: Vasectomy in husband  Other Topics Concern   Not on file  Social History Narrative   Not on file   Social Drivers of Health   Financial Resource Strain: Low Risk  (06/13/2023)   Received from [facility]   Overall Financial Resource Strain (CARDIA)    Difficulty of Paying Living Expenses: Not hard at all  Food Insecurity: No Food Insecurity (06/13/2023)   Received from [facility]   Hunger Vital Sign    Worried About Running Out of Food in the Last Year: Never true    Ran Out of Food in the Last Year: Never true  Transportation Needs: No Transportation Needs (06/13/2023)   Received from [facility]    In the past 12 months, has lack of transportation kept you from medical appointments or from getting medications?: No    Lack of Transportation (Non-Medical): No  Physical Activity: Not on file  Stress: Not on file  Social Connections: Not on file    Allergies:  Allergies  Allergen Reactions   Ceclor [Cefaclor] Hives   Septra [Sulfamethoxazole-Trimethoprim] Nausea And Vomiting   Sulfamethoxazole    Trimethoprim     Metabolic Disorder Labs: Lab Results  Component Value Date   HGBA1C 5.6 05/08/2020   No results found for: "PROLACTIN" Lab Results  Component Value Date   CHOL 181 05/08/2020   TRIG 64.0 05/08/2020   HDL 81.80  05/08/2020   CHOLHDL 2 05/08/2020   VLDL 12.8 05/08/2020   LDLCALC 86 05/08/2020   LDLCALC 100 (H) 03/29/2019   Lab Results  Component Value Date   TSH 0.92 05/08/2020   TSH 1.14 03/29/2019    Therapeutic Level Labs: No results found for: "LITHIUM" No results found for: "VALPROATE" No results found for: "CBMZ"  Current Medications: Current Outpatient Medications  Medication Sig Dispense Refill   cetirizine (ZYRTEC) 10 MG tablet Take 10 mg by mouth daily.     omeprazole (PRILOSEC) 20 MG capsule Take 1 capsule (20 mg total) by mouth daily. 90 capsule 1   ALPRAZolam (XANAX) 0.25 MG tablet Take 1-2 tablets (0.25-0.5 mg total) by mouth daily as needed for anxiety. 12 tablet 2   ferrous sulfate 325 (65 FE) MG EC tablet Take 1 tablet (325 mg total) by mouth daily. (Patient not taking: Reported on 11/24/2023) 30 tablet 1  naproxen (NAPROSYN) 500 MG tablet Take 1 tablet (500 mg total) by mouth 2 (two) times daily with a meal. (Patient not taking: Reported on 11/24/2023) 30 tablet 0   No current facility-administered medications for this visit.     Musculoskeletal: Strength & Muscle Tone:  UTA Gait & Station:  Seated Patient leans: N/A  Psychiatric Specialty Exam: Review of Systems  Psychiatric/Behavioral: Negative.      There were no vitals taken for this visit.There is no height or weight on file to calculate BMI.  General Appearance: Casual  Eye Contact:  Fair  Speech:  Clear and Coherent  Volume:  Normal  Mood:  Euthymic  Affect:  Appropriate  Thought Process:  Goal Directed and Descriptions of Associations: Intact  Orientation:  Full (Time, Place, and Person)  Thought Content: Logical   Suicidal Thoughts:  No  Homicidal Thoughts:  No  Memory:  Immediate;   Fair Recent;   Fair Remote;   Fair  Judgement:  Fair  Insight:  Fair  Psychomotor Activity:  Normal  Concentration:  Concentration: Fair and Attention Span: Fair  Recall:  [employer] of Knowledge: Fair   Language: Fair  Akathisia:  No  Handed:  Right  AIMS (if indicated): not done  Assets:  Desire for Improvement Housing Social Support  ADL's:  Intact  Cognition: WNL  Sleep:  Fair   Screenings: AIMS    Flowsheet Row Video Visit from 03/25/2022 in [facility]  AIMS Total Score 0      GAD-7    Flowsheet Row Office Visit from 12/08/2022 in [facility] Video Visit from 03/25/2022 in [facility] Video Visit from 12/03/2021 in [facility] Video Visit from 06/05/2021 in [facility] Video Visit from 04/22/2021 in [facility]  Total GAD-7 Score 0 0 1 0 1      PHQ2-9    Flowsheet Row Office Visit from 12/08/2022 in [facility] Video Visit from 03/25/2022 in [facility] Counselor from 12/29/2021 in [facility] Video Visit from 12/03/2021 in [facility] Video Visit from 06/05/2021 in [facility]  PHQ-2 Total Score 0 0 0 0 0  PHQ-9 Total Score 6 -- -- -- --      Flowsheet Row Video Visit from 11/24/2023 in [facility] Video Visit from 10/20/2023 in [facility] Video Visit from 06/28/2023 in [facility]  C-SSRS RISK CATEGORY No Risk No Risk No Risk        Assessment and Plan: Michele Houston is a 38 year old Caucasian female, employed, lives in [place], has a history of panic disorder, ADHD, MDD was evaluated by telemedicine today.  Patient is currently tapered off of the sertraline, noncompliant on the BuSpar and has not started  Cymbalta as discussed last visit, patient currently reports mood symptoms are stable, discussed assessment and plan as noted below.  Panic disorder/specific phobia of flying-stable Stopped sertraline, has not started duloxetine. Currently asymptomatic without medication. Uses Xanax for emergencies, including an upcoming flight. Discussed starting duloxetine at 20 mg if symptoms return. - Continue Xanax 0.25 mg as needed for significant anxiety. - Consider starting duloxetine 20 mg if anxiety symptoms return - Schedule follow-up in April  for in-person evaluation  Attention-Deficit/Hyperactivity Disorder (ADHD)-currently not on any medications. Has not restarted stimulant medication. Discussed need for in-person visit, potential urine drug screen, and EKG before restarting. Received information from [facility]. - Schedule in-person visit for ADHD evaluation - Consider urine drug screen and EKG before starting stimulant medication  MDD in remission Patient currently denies any depression symptoms. - Patient was able to successfully taper off sertraline, currently denies any side effects. - Patient has Cymbalta available which was prescribed last visit however has not started taking it yet.  Follow-up - Schedule follow-up for April 28th at 8:30 AM.    Consent: Patient/Guardian gives verbal consent for treatment and assignment of benefits for services provided during this visit. Patient/Guardian expressed understanding and agreed to proceed.   This note was generated in part or whole with voice recognition software. Voice recognition is usually quite accurate but there are transcription errors that can and very often do occur. I apologize for any typographical errors that were not detected and corrected.    Jomarie Longs, MD 11/24/2023, 7:17 PM

## 2023-12-11 ENCOUNTER — Telehealth: Payer: 59 | Admitting: Family

## 2023-12-11 DIAGNOSIS — J019 Acute sinusitis, unspecified: Secondary | ICD-10-CM | POA: Diagnosis not present

## 2023-12-11 MED ORDER — AMOXICILLIN-POT CLAVULANATE 875-125 MG PO TABS: 1.0000 | ORAL_TABLET | Freq: Two times a day (BID) | ORAL | 0 refills | Status: DC

## 2023-12-11 NOTE — Progress Notes (Signed)
 E-Visit for Sinus Problems  We are sorry that you are not feeling well.  Here is how we plan to help!  Based on what you have shared with me it looks like you have sinusitis.  Sinusitis is inflammation and infection in the sinus cavities of the head.  Based on your presentation I believe you most likely have Acute Bacterial Sinusitis.  This is an infection caused by bacteria and is treated with antibiotics. I have prescribed Augmentin 875mg /125mg  one tablet twice daily with food, for 7 days. You may use an oral decongestant such as Mucinex D or if you have glaucoma or high blood pressure use plain Mucinex. Saline nasal spray help and can safely be used as often as needed for congestion.  If you develop worsening sinus pain, fever or notice severe headache and vision changes, or if symptoms are not better after completion of antibiotic, please schedule an appointment with a health care provider.    Sinus infections are not as easily transmitted as other respiratory infection, however we still recommend that you avoid close contact with loved ones, especially the very young and elderly.  Remember to wash your hands thoroughly throughout the day as this is the number one way to prevent the spread of infection!  Home Care: Only take medications as instructed by your medical team. Complete the entire course of an antibiotic. Do not take these medications with alcohol. A steam or ultrasonic humidifier can help congestion.  You can place a towel over your head and breathe in the steam from hot water coming from a faucet. Avoid close contacts especially the very young and the elderly. Cover your mouth when you cough or sneeze. Always remember to wash your hands.  Get Help Right Away If: You develop worsening fever or sinus pain. You develop a severe head ache or visual changes. Your symptoms persist after you have completed your treatment plan.  Make sure you Understand these instructions. Will watch  your condition. Will get help right away if you are not doing well or get worse.  Thank you for choosing an e-visit.  Your e-visit answers were reviewed by a board certified advanced clinical practitioner to complete your personal care plan. Depending upon the condition, your plan could have included both over the counter or prescription medications.  Please review your pharmacy choice. Make sure the pharmacy is open so you can pick up prescription now. If there is a problem, you may contact your provider through [employer] and have the prescription routed to another pharmacy.  Your safety is important to [place]. If you have drug allergies check your prescription carefully.   For the next 24 hours you can use MyChart to ask questions about today's visit, request a non-urgent call back, or ask for a work or school excuse. You will get an email in the next two days asking about your experience. I hope that your e-visit has been valuable and will speed your recovery.   Approximately 5 minutes was spent documenting and reviewing patient's chart.

## 2023-12-16 ENCOUNTER — Other Ambulatory Visit (HOSPITAL_COMMUNITY): Payer: Self-pay

## 2024-01-24 ENCOUNTER — Other Ambulatory Visit (HOSPITAL_COMMUNITY): Payer: Self-pay

## 2024-01-25 ENCOUNTER — Other Ambulatory Visit (HOSPITAL_COMMUNITY): Payer: Self-pay

## 2024-01-26 ENCOUNTER — Other Ambulatory Visit: Payer: Self-pay

## 2024-01-26 ENCOUNTER — Other Ambulatory Visit (HOSPITAL_COMMUNITY): Payer: Self-pay

## 2024-01-26 MED ORDER — OMEPRAZOLE 20 MG PO CPDR
20.0000 mg | DELAYED_RELEASE_CAPSULE | Freq: Every day | ORAL | 0 refills | Status: DC
  Filled 2024-01-26: qty 60, 60d supply, fill #0

## 2024-01-26 MED ORDER — ALPRAZOLAM 0.25 MG PO TABS
0.2500 mg | ORAL_TABLET | Freq: Every day | ORAL | 0 refills | Status: DC | PRN
  Filled 2024-01-26: qty 12, 6d supply, fill #0

## 2024-01-30 ENCOUNTER — Ambulatory Visit (INDEPENDENT_AMBULATORY_CARE_PROVIDER_SITE_OTHER): Payer: 59 | Admitting: Family Medicine

## 2024-01-30 ENCOUNTER — Other Ambulatory Visit (HOSPITAL_COMMUNITY): Payer: Self-pay

## 2024-01-30 ENCOUNTER — Other Ambulatory Visit: Payer: Self-pay

## 2024-01-30 ENCOUNTER — Encounter: Payer: Self-pay | Admitting: Family Medicine

## 2024-01-30 VITALS — BP 115/76 | HR 68 | Temp 97.9°F | Resp 16 | Ht 67.0 in | Wt 223.0 lb

## 2024-01-30 DIAGNOSIS — Z7689 Persons encountering health services in other specified circumstances: Secondary | ICD-10-CM

## 2024-01-30 DIAGNOSIS — Z13220 Encounter for screening for lipoid disorders: Secondary | ICD-10-CM

## 2024-01-30 DIAGNOSIS — Z130 Encounter for screening for diseases of the blood and blood-forming organs and certain disorders involving the immune mechanism: Secondary | ICD-10-CM

## 2024-01-30 DIAGNOSIS — Z0000 Encounter for general adult medical examination without abnormal findings: Secondary | ICD-10-CM

## 2024-01-30 MED ORDER — ONDANSETRON HCL 4 MG PO TABS
4.0000 mg | ORAL_TABLET | Freq: Three times a day (TID) | ORAL | 1 refills | Status: DC | PRN
  Filled 2024-01-30: qty 30, 10d supply, fill #0

## 2024-01-31 ENCOUNTER — Encounter: Payer: Self-pay | Admitting: Family Medicine

## 2024-01-31 NOTE — Progress Notes (Signed)
 New Patient Office Visit  Subjective    Patient ID: Michele Houston, female    DOB: Mar 30, 1986  Age: 38 y.o. MRN: [id number]  CC:  Chief Complaint  Patient presents with   Establish Care    Medication for nausea just incase she gets sick on her cruise in two weeks    HPI Michele Houston presents to establish care and for routine annual exam. Patient reports that she will be going on a cruise and wants something for nausea/motion sickness.     Outpatient Encounter Medications as of 01/30/2024  Medication Sig   ALPRAZolam (XANAX) 0.25 MG tablet Take 1-2 tablets (0.25-0.5 mg total) by mouth daily as needed for anxiety.   cetirizine (ZYRTEC) 10 MG tablet Take 10 mg by mouth daily.   omeprazole (PRILOSEC) 20 MG capsule Take 1 capsule (20 mg total) by mouth daily.   ondansetron (ZOFRAN) 4 MG tablet Take 1 tablet (4 mg total) by mouth every 8 (eight) hours as needed for nausea or vomiting.   amoxicillin-clavulanate (AUGMENTIN) 875-125 MG tablet Take 1 tablet by mouth 2 (two) times daily.   ferrous sulfate 325 (65 FE) MG EC tablet Take 1 tablet (325 mg total) by mouth daily. (Patient not taking: Reported on 11/24/2023)   naproxen (NAPROSYN) 500 MG tablet Take 1 tablet (500 mg total) by mouth 2 (two) times daily with a meal. (Patient not taking: Reported on 11/24/2023)   omeprazole (PRILOSEC) 20 MG capsule Take 1 capsule (20 mg total) by mouth daily. (Patient not taking: Reported on 01/30/2024)   No facility-administered encounter medications on file as of 01/30/2024.    Past Medical History:  Diagnosis Date   Anemia    when pregnant only   GERD (gastroesophageal reflux disease)     Past Surgical History:  Procedure Laterality Date   CHOLECYSTECTOMY N/A 06/03/2015   Procedure: LAPAROSCOPIC CHOLECYSTECTOMY WITH INTRAOPERATIVE CHOLANGIOGRAM;  Surgeon: Earline Mayotte, MD;  Location: [facility] ORS;  Service: General;  Laterality: N/A;   KNEE SURGERY Left 2011   arthroscopy   tubes in ears   baby    Family History  Problem Relation Age of Onset   Hyperthyroidism Mother    Depression Mother    Lung cancer Father    Depression Father    Drug abuse Father    Lung cancer Paternal Uncle    Lung cancer Paternal Grandmother    Cervical cancer Sister    Depression Sister    Hypertension Brother    Bipolar disorder Sister     Social History   Socioeconomic History   Marital status: Married    Spouse name: Not on file   Number of children: 3   Years of education: Not on file   Highest education level: Some college, no degree  Occupational History   Occupation: Chief Executive Officer: [employer]  Tobacco Use   Smoking status: Former    Current packs/day: 0.00    Average packs/day: 1 pack/day for 4.0 years (4.0 ttl pk-yrs)    Types: Cigarettes    Start date: 05/30/2007    Quit date: 05/30/2011    Years since quitting: 12.6   Smokeless tobacco: Never  Vaping Use   Vaping status: Never Used  Substance and Sexual Activity   Alcohol use: Not Currently    Alcohol/week: 0.0 standard drinks of alcohol   Drug use: Never   Sexual activity: Yes    Birth control/protection: None    Comment: Vasectomy in husband  Other Topics Concern   Not on file  Social History Narrative   Not on file   Social Drivers of Health   Financial Resource Strain: Low Risk  (01/26/2024)   Overall Financial Resource Strain (CARDIA)    Difficulty of Paying Living Expenses: Not very hard  Food Insecurity: No Food Insecurity (01/26/2024)   Hunger Vital Sign    Worried About Running Out of Food in the Last Year: Never true    Ran Out of Food in the Last Year: Never true  Transportation Needs: No Transportation Needs (01/26/2024)   PRAPARE - Administrator, Civil Service (Medical): No    Lack of Transportation (Non-Medical): No  Physical Activity: Insufficiently Active (01/26/2024)   Exercise Vital Sign    Days of Exercise per Week: 3 days    Minutes of Exercise per Session: 20  min  Stress: Stress Concern Present (01/26/2024)   [employer] of Occupational Health - Occupational Stress Questionnaire    Feeling of Stress : To some extent  Social Connections: Socially Integrated (01/26/2024)   Social Connection and Isolation Panel [NHANES]    Frequency of Communication with Friends and Family: Three times a week    Frequency of Social Gatherings with Friends and Family: Once a week    Attends Religious Services: More than 4 times per year    Active Member of [employer] or Organizations: Yes    Attends Engineer, structural: More than 4 times per year    Marital Status: Married  Catering manager Violence: Not on file    Review of Systems  All other systems reviewed and are negative.       Objective   BP 115/76   Pulse 68   Temp 97.9 F (36.6 C) (Oral)   Resp 16   Ht 5\' 7"  (1.702 m)   Wt 223 lb (101.2 kg)   SpO2 98%   BMI 34.93 kg/m   Physical Exam Vitals and nursing note reviewed.  Constitutional:      General: She is not in acute distress.    Appearance: She is obese.  HENT:     Head: Normocephalic and atraumatic.     Right Ear: Tympanic membrane, ear canal and external ear normal.     Left Ear: Tympanic membrane, ear canal and external ear normal.     Nose: Nose normal.     Mouth/Throat:     Mouth: Mucous membranes are moist.     Pharynx: Oropharynx is clear.  Eyes:     Conjunctiva/sclera: Conjunctivae normal.     Pupils: Pupils are equal, round, and reactive to light.  Neck:     Thyroid: No thyromegaly.  Cardiovascular:     Rate and Rhythm: Normal rate and regular rhythm.     Heart sounds: Normal heart sounds. No murmur heard. Pulmonary:     Effort: Pulmonary effort is normal. No respiratory distress.     Breath sounds: Normal breath sounds.  Abdominal:     General: There is no distension.     Palpations: Abdomen is soft. There is no mass.     Tenderness: There is no abdominal tenderness.  Musculoskeletal:        General:  Normal range of motion.     Cervical back: Normal range of motion and neck supple.  Skin:    General: Skin is warm and dry.  Neurological:     General: No focal deficit present.     Mental Status: She is alert  and oriented to person, place, and time.  Psychiatric:        Mood and Affect: Mood normal.        Behavior: Behavior normal.         Assessment & Plan:   Annual physical exam -     CMP14+EGFR  Screening for deficiency anemia -     CBC with Differential/Platelet  Screening for lipid disorders -     Lipid panel  Encounter to establish care  Other orders -     Ondansetron HCl; Take 1 tablet (4 mg total) by mouth every 8 (eight) hours as needed for nausea or vomiting.  Dispense: 30 tablet; Refill: 1     No follow-ups on file.   Tommie Raymond, MD

## 2024-02-20 ENCOUNTER — Telehealth: Payer: Self-pay | Admitting: Psychiatry

## 2024-02-20 NOTE — Telephone Encounter (Signed)
 Patient called to cancel her appointment on 02-27-24 due to she cannot afford it on the [facility] plan. She also is not taking the medication suggested and was advised if not taking and needed she should cancel her appointment. She is doing well.

## 2024-02-27 ENCOUNTER — Ambulatory Visit: Payer: Self-pay | Admitting: Psychiatry

## 2024-02-29 ENCOUNTER — Encounter: Payer: Self-pay | Admitting: Family Medicine

## 2024-03-12 ENCOUNTER — Encounter: Payer: Self-pay | Admitting: Certified Nurse Midwife

## 2024-03-12 ENCOUNTER — Other Ambulatory Visit (HOSPITAL_COMMUNITY)
Admission: RE | Admit: 2024-03-12 | Discharge: 2024-03-12 | Disposition: A | Discharge status: Home / Self Care | Source: Ambulatory Visit | Attending: Certified Nurse Midwife | Admitting: Certified Nurse Midwife

## 2024-03-12 ENCOUNTER — Ambulatory Visit (INDEPENDENT_AMBULATORY_CARE_PROVIDER_SITE_OTHER): Admitting: Certified Nurse Midwife

## 2024-03-12 VITALS — BP 118/70 | HR 67 | Ht 67.0 in | Wt 228.9 lb

## 2024-03-12 DIAGNOSIS — Z124 Encounter for screening for malignant neoplasm of cervix: Secondary | ICD-10-CM | POA: Insufficient documentation

## 2024-03-12 DIAGNOSIS — Z1151 Encounter for screening for human papillomavirus (HPV): Secondary | ICD-10-CM

## 2024-03-12 DIAGNOSIS — Z0000 Encounter for general adult medical examination without abnormal findings: Secondary | ICD-10-CM | POA: Diagnosis not present

## 2024-03-12 DIAGNOSIS — Z01419 Encounter for gynecological examination (general) (routine) without abnormal findings: Secondary | ICD-10-CM

## 2024-03-12 DIAGNOSIS — Z13220 Encounter for screening for lipoid disorders: Secondary | ICD-10-CM | POA: Diagnosis not present

## 2024-03-12 DIAGNOSIS — Z130 Encounter for screening for diseases of the blood and blood-forming organs and certain disorders involving the immune mechanism: Secondary | ICD-10-CM | POA: Diagnosis not present

## 2024-03-12 NOTE — Progress Notes (Signed)
 ANNUAL EXAM Patient name: Michele Houston MRN [id number]  Date of birth: [birth year] Chief Complaint:   Gynecologic Exam and New Patient (Initial Visit)  History of Present Illness:   Michele Houston is a 38 y.o. G28P0003 Caucasian female being seen today for a routine annual exam.  Current complaints: vaginal odor with activity/sweating. Has tried boric acid in the past with some relief. Reports mood changes in 2 weeks prior to menstrual cycle. Stopped SSRI recently, has not noticed a big change in mood since, was managed by psychiatrist. Had annual physical with labs ordered with PCP recently.  Patient's last menstrual period was 03/07/2024 (exact date).   Upstream - 03/12/24 0845       Pregnancy Intention Screening   Does the patient want to become pregnant in the next year? No    Does the patient's partner want to become pregnant in the next year? No    Would the patient like to discuss contraceptive options today? No      Contraception Wrap Up   Current Method Vasectomy    End Method Vasectomy    Contraception Counseling Provided No            The pregnancy intention screening data noted above was reviewed. Potential methods of contraception were discussed. The patient elected to proceed with Vasectomy.      Component Value Date/Time   DIAGPAP  03/29/2019 0000    NEGATIVE FOR INTRAEPITHELIAL LESIONS OR MALIGNANCY.   ADEQPAP  03/29/2019 0000    Satisfactory for evaluation  endocervical/transformation zone component PRESENT.       Last pap 2020. Results were: NILM w/ HRHPV not done. H/O abnormal pap: no Last mammogram: n/a. Results were: N/A. Family h/o breast cancer: no Last colonoscopy: n/a. Results were: N/A. Family h/o colorectal cancer: no     01/30/2024    8:14 AM 12/08/2022    4:47 PM 03/25/2022    2:49 PM 12/29/2021    3:45 PM 12/03/2021    2:43 PM  Depression screen PHQ 2/9  Decreased Interest 0      Down, Depressed, Hopeless 0      PHQ - 2 Score 0       Altered sleeping       Tired, decreased energy       Change in appetite       Feeling bad or failure about yourself        Trouble concentrating       Moving slowly or fidgety/restless       Suicidal thoughts       PHQ-9 Score       Difficult doing work/chores          Information is confidential and restricted. Go to Review Flowsheets to unlock data.         01/30/2024    8:15 AM 12/08/2022    4:47 PM 03/25/2022    2:50 PM 12/03/2021    2:43 PM  GAD 7 : Generalized Anxiety Score  Nervous, Anxious, on Edge 0     Control/stop worrying 0     Worry too much - different things 0     Trouble relaxing 0     Restless 0     Easily annoyed or irritable 1     Afraid - awful might happen 0     Total GAD 7 Score 1     Anxiety Difficulty Somewhat difficult        Information is confidential and restricted.  Go to Review Flowsheets to unlock data.      Review of Systems:   Pertinent items are noted in HPI Denies any headaches, blurred vision, fatigue, shortness of breath, chest pain, abdominal pain, abnormal vaginal discharge/itching/odor/irritation, problems with periods, bowel movements, urination, or intercourse unless otherwise stated above. Pertinent History Reviewed:  Reviewed past medical,surgical, social and family history.  Reviewed problem list, medications and allergies. Physical Assessment:   Vitals:   03/12/24 0846  BP: 118/70  Pulse: 67  Weight: 228 lb 14.4 oz (103.8 kg)  Height: 5\' 7"  (1.702 m)  Body mass index is 35.85 kg/m.       Physical Exam Vitals reviewed.  Constitutional:      General: She is not in acute distress.    Appearance: Normal appearance.  HENT:     Head: Normocephalic.  Neck:     Thyroid: No thyroid mass or thyromegaly.  Cardiovascular:     Rate and Rhythm: Normal rate and regular rhythm.     Heart sounds: Normal heart sounds.  Pulmonary:     Effort: Pulmonary effort is normal.     Breath sounds: Normal breath sounds.  Chest:   Breasts:    Tanner Score is 5.     Right: Normal.     Left: Normal.  Abdominal:     General: Abdomen is flat.     Palpations: Abdomen is soft.     Tenderness: There is no abdominal tenderness.  Genitourinary:    General: Normal vulva.     Tanner stage (genital): 5.     Vagina: Normal.     Cervix: Friability present. No cervical motion tenderness.     Uterus: Normal. Not enlarged, not fixed and not tender.      Adnexa:        Right: No mass or tenderness.         Left: No mass or tenderness.    Musculoskeletal:     Cervical back: Neck supple. No tenderness.  Skin:    General: Skin is warm and dry.  Neurological:     General: No focal deficit present.     Mental Status: She is alert and oriented to person, place, and time.  Psychiatric:        Mood and Affect: Mood normal.        Behavior: Behavior normal.      No results found for this or any previous visit (from the past 24 hours).  Assessment & Plan:  1. Well woman exam (Primary)  2. Cervical cancer screening  3. Encounter for screening for human papillomavirus (HPV)  Reviewed boric acid use as treatment or preventive after IC.  Consider SSRI use in 2w prior (luteal phase) to menstrual cycle to manage mood.  Mammogram: @ 36yo, or sooner if problems Colonoscopy: @ 36yo, or sooner if problems  No orders of the defined types were placed in this encounter.   Meds: No orders of the defined types were placed in this encounter.   Follow-up: Return in 1 year (on 03/12/2025) for Annual exam.  Forestine Igo, CNM 03/12/2024 9:06 AM

## 2024-03-12 NOTE — Patient Instructions (Signed)

## 2024-03-13 ENCOUNTER — Other Ambulatory Visit: Payer: Self-pay | Admitting: Certified Nurse Midwife

## 2024-03-13 ENCOUNTER — Encounter: Payer: Self-pay | Admitting: Family Medicine

## 2024-03-13 ENCOUNTER — Other Ambulatory Visit: Payer: Self-pay

## 2024-03-13 ENCOUNTER — Other Ambulatory Visit (HOSPITAL_COMMUNITY): Payer: Self-pay

## 2024-03-13 DIAGNOSIS — F3281 Premenstrual dysphoric disorder: Secondary | ICD-10-CM

## 2024-03-13 LAB — CBC WITH DIFFERENTIAL/PLATELET
Basophils Absolute: 0 10*3/uL (ref 0.0–0.2)
Basos: 0 %
EOS (ABSOLUTE): 0.1 10*3/uL (ref 0.0–0.4)
Eos: 1 %
Hematocrit: 46.8 % — ABNORMAL HIGH (ref 34.0–46.6)
Hemoglobin: 14.9 g/dL (ref 11.1–15.9)
Immature Grans (Abs): 0 10*3/uL (ref 0.0–0.1)
Immature Granulocytes: 0 %
Lymphocytes Absolute: 2 10*3/uL (ref 0.7–3.1)
Lymphs: 31 %
MCH: 29 pg (ref 26.6–33.0)
MCHC: 31.8 g/dL (ref 31.5–35.7)
MCV: 91 fL (ref 79–97)
Monocytes Absolute: 0.4 10*3/uL (ref 0.1–0.9)
Monocytes: 6 %
Neutrophils Absolute: 3.9 10*3/uL (ref 1.4–7.0)
Neutrophils: 62 %
Platelets: 356 10*3/uL (ref 150–450)
RBC: 5.13 x10E6/uL (ref 3.77–5.28)
RDW: 12.4 % (ref 11.7–15.4)
WBC: 6.4 10*3/uL (ref 3.4–10.8)

## 2024-03-13 LAB — CMP14+EGFR
ALT: 54 IU/L — ABNORMAL HIGH (ref 0–32)
AST: 29 IU/L (ref 0–40)
Albumin: 4.5 g/dL (ref 3.9–4.9)
Alkaline Phosphatase: 82 IU/L (ref 44–121)
BUN/Creatinine Ratio: 13 (ref 9–23)
BUN: 13 mg/dL (ref 6–20)
Bilirubin Total: 0.3 mg/dL (ref 0.0–1.2)
CO2: 24 mmol/L (ref 20–29)
Calcium: 10.8 mg/dL — ABNORMAL HIGH (ref 8.7–10.2)
Chloride: 106 mmol/L (ref 96–106)
Creatinine, Ser: 1.03 mg/dL — ABNORMAL HIGH (ref 0.57–1.00)
Globulin, Total: 2.1 g/dL (ref 1.5–4.5)
Glucose: 109 mg/dL — ABNORMAL HIGH (ref 70–99)
Potassium: 6 mmol/L — ABNORMAL HIGH (ref 3.5–5.2)
Sodium: 147 mmol/L — ABNORMAL HIGH (ref 134–144)
Total Protein: 6.6 g/dL (ref 6.0–8.5)
eGFR: 73 mL/min/{1.73_m2} (ref >59)

## 2024-03-13 LAB — CYTOLOGY - PAP
Comment: NEGATIVE
Diagnosis: NEGATIVE
High risk HPV: NEGATIVE

## 2024-03-13 LAB — LIPID PANEL
Chol/HDL Ratio: 2.8 ratio (ref 0.0–4.4)
Cholesterol, Total: 198 mg/dL (ref 100–199)
HDL: 70 mg/dL (ref >39)
LDL Chol Calc (NIH): 108 mg/dL — ABNORMAL HIGH (ref 0–99)
Triglycerides: 111 mg/dL (ref 0–149)
VLDL Cholesterol Cal: 20 mg/dL (ref 5–40)

## 2024-03-13 MED ORDER — SERTRALINE HCL 50 MG PO TABS
50.0000 mg | ORAL_TABLET | Freq: Every day | ORAL | 1 refills | Status: DC
  Filled 2024-03-13: qty 90, 90d supply, fill #0
  Filled 2024-03-13: qty 45, 45d supply, fill #0
  Filled 2024-04-23: qty 45, 90d supply, fill #1

## 2024-03-13 NOTE — Progress Notes (Signed)
 Start Zoloft  50mg  daily at day 14 of cycle, continuing for 14 days until first day of bleeding for PMDD symptoms.

## 2024-03-15 ENCOUNTER — Ambulatory Visit: Payer: Self-pay | Admitting: Certified Nurse Midwife

## 2024-03-15 NOTE — Telephone Encounter (Signed)
 Patient stated she would call office back

## 2024-04-07 ENCOUNTER — Ambulatory Visit
Admission: EM | Admit: 2024-04-07 | Discharge: 2024-04-07 | Disposition: A | Discharge status: Home / Self Care | Attending: Emergency Medicine | Admitting: Emergency Medicine

## 2024-04-07 DIAGNOSIS — J069 Acute upper respiratory infection, unspecified: Secondary | ICD-10-CM | POA: Diagnosis not present

## 2024-04-07 LAB — POCT RAPID STREP A (OFFICE): Rapid Strep A Screen: NEGATIVE

## 2024-04-07 MED ORDER — PROMETHAZINE-DM 6.25-15 MG/5ML PO SYRP: 5.0000 mL | ORAL_SOLUTION | Freq: Every evening | ORAL | 0 refills | Status: DC | PRN

## 2024-04-07 MED ORDER — AMOXICILLIN-POT CLAVULANATE 875-125 MG PO TABS: 1.0000 | ORAL_TABLET | Freq: Two times a day (BID) | ORAL | 0 refills | Status: DC

## 2024-04-07 MED ORDER — BENZONATATE 100 MG PO CAPS: 100.0000 mg | ORAL_CAPSULE | Freq: Three times a day (TID) | ORAL | 0 refills | Status: DC

## 2024-04-07 NOTE — ED Triage Notes (Signed)
 Patient presents to UC for sore throat and cough x 1 week. Treating with mucinex  and tylenol .

## 2024-04-07 NOTE — Discharge Instructions (Addendum)
 Begin Augmentin  twice daily for bacterial coverage  May use Tessalon pill every 8 hours as needed for cough, may use cough syrup at bedtime for additional comfort    You can take Tylenol  and/or Ibuprofen as needed for fever reduction and pain relief.   For cough: honey 1/2 to 1 teaspoon (you can dilute the honey in water or another fluid).  You can also use guaifenesin  and dextromethorphan  for cough. You can use a humidifier for chest congestion and cough.  If you don't have a humidifier, you can sit in the bathroom with the hot shower running.      For sore throat: try warm salt water gargles, cepacol lozenges, throat spray, warm tea or water with lemon/honey, popsicles or ice, or OTC cold relief medicine for throat discomfort.   For congestion: take a daily anti-histamine like Zyrtec, Claritin, and a oral decongestant, such as pseudoephedrine.  You can also use Flonase  1-2 sprays in each nostril daily.   It is important to stay hydrated: drink plenty of fluids (water, gatorade/powerade/pedialyte, juices, or teas) to keep your throat moisturized and help further relieve irritation/discomfort.

## 2024-04-07 NOTE — ED Provider Notes (Signed)
 Michele Houston    CSN: [id number] Arrival date & time: 04/07/24  1423      History   Chief Complaint Chief Complaint  Patient presents with   Sore Throat    HPI Michele Houston is a 38 y.o. female.   Patient presents for evaluation of bilateral ear pain, sore throat and a nonproductive cough present for 7 days.  Has attempted use of Mucinex  and Tylenol  without relief.  Decreased appetite but able to tolerate food and liquids.  No known sick contacts prior.  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   MDD (major depressive disorder), recurrent, in full remission (HCC) 07/16/2020   BMI 29.0-29.9,adult 05/08/2020   Heart palpitations 05/08/2020   Need for hepatitis C screening test 05/08/2020   Migraine 12/29/2018   Anxiety and depression 12/06/2018   Anxiety 12/06/2018   Hemorrhoids 12/08/2015   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   Recurrent major depressive disorder (HCC) 08/11/2015    Past Surgical History:  Procedure Laterality Date   CHOLECYSTECTOMY N/A 06/03/2015   Procedure: LAPAROSCOPIC CHOLECYSTECTOMY WITH INTRAOPERATIVE CHOLANGIOGRAM;  Surgeon: Marshall Skeeter, MD;  Location: [facility] ORS;  Service: General;  Laterality: N/A;   KNEE SURGERY Left 2011   arthroscopy   tubes in ears  baby    OB History     Gravida  3   Para  3   Term  0   Preterm  0   AB  0   Living  3      SAB  0   IAB  0   Ectopic  0   Multiple  0   Live Births  0        Obstetric Comments  1st Menstrual Cycle:  13 1st Pregnancy:  20           Home Medications    Prior to Admission medications   Medication Sig Start Date End Date Taking? Authorizing Provider  amoxicillin -clavulanate (AUGMENTIN ) 875-125 MG tablet Take 1 tablet by mouth every 12 (twelve) hours. 04/07/24  Yes Jamar Weatherall R, NP  benzonatate (TESSALON) 100 MG capsule Take 1 capsule (100 mg total) by mouth every 8 (eight) hours. 04/07/24  Yes Dayvion Sans, Maybelle Spatz, NP  promethazine -dextromethorphan  (PROMETHAZINE -DM) 6.25-15 MG/5ML syrup Take 5 mLs by mouth at bedtime as needed. 04/07/24  Yes Sheva Mcdougle R, NP  ALPRAZolam  (XANAX ) 0.25 MG tablet Take 1-2 tablets (0.25-0.5 mg total) by mouth daily as needed for anxiety. 11/17/23   Eappen, Saramma, MD  cetirizine (ZYRTEC) 10 MG tablet Take 10 mg by mouth daily.    [provider]  omeprazole  (PRILOSEC) 20 MG capsule Take 1 capsule (20 mg total) by mouth daily. 09/22/23     sertraline  (ZOLOFT ) 50 MG tablet Take 1 tablet (50 mg total) by mouth daily. Take one tablet daily starting day 14 of menstrual cycle & continue for 14 days 03/13/24   Forestine Igo, CNM    Family History Family History  Problem Relation Age of Onset   Hyperthyroidism Mother    Depression  Mother    Lung cancer Father    Depression Father    Drug abuse Father    Cervical cancer Sister    Depression Sister    Bipolar disorder Sister    Hypertension Brother    Lung cancer Paternal Grandmother    Lung cancer Paternal Uncle     Social History Social History   Tobacco Use   Smoking status: Former    Current packs/day: 0.00    Average packs/day: 1 pack/day for 4.0 years (4.0 ttl pk-yrs)    Types: Cigarettes    Start date: 05/30/2007    Quit date: 05/30/2011    Years since quitting: 12.8   Smokeless tobacco: Never  Vaping Use   Vaping status: Never Used  Substance Use Topics   Alcohol use: Not Currently    Alcohol/week: 0.0 standard drinks of alcohol   Drug use: Never     Allergies    Ceclor [cefaclor], Septra [sulfamethoxazole-trimethoprim], Sulfamethoxazole, and Trimethoprim   Review of Systems Review of Systems   Physical Exam Triage Vital Signs ED Triage Vitals  Encounter Vitals Group     BP 04/07/24 1455 116/78     Systolic BP Percentile --      Diastolic BP Percentile --      Pulse Rate 04/07/24 1455 85     Resp 04/07/24 1455 16     Temp --      Temp src --      SpO2 04/07/24 1455 96 %     Weight --      Height --      Head Circumference --      Peak Flow --      Pain Score 04/07/24 1454 7     Pain Loc --      Pain Education --      Exclude from Growth Chart --    No data found.  Updated Vital Signs BP 116/78 (BP Location: Left Arm)   Pulse 85   Resp 16   LMP 04/07/2024 (Exact Date)   SpO2 96%   Visual Acuity Right Eye Distance:   Left Eye Distance:   Bilateral Distance:    Right Eye Near:   Left Eye Near:    Bilateral Near:     Physical Exam Constitutional:      Appearance: Normal appearance.  HENT:     Right Ear: Tympanic membrane, ear canal and external ear normal.     Left Ear: Tympanic membrane, ear canal and external ear normal.     Nose: No congestion or rhinorrhea.     Mouth/Throat:     Pharynx: No oropharyngeal exudate or posterior oropharyngeal erythema.  Eyes:     Extraocular Movements: Extraocular movements intact.  Cardiovascular:     Rate and Rhythm: Normal rate and regular rhythm.     Pulses: Normal pulses.     Heart sounds: Normal heart sounds.  Pulmonary:     Effort: Pulmonary effort is normal.     Breath sounds: Normal breath sounds.  Musculoskeletal:     Cervical back: Normal range of motion and neck supple.  Lymphadenopathy:     Cervical: Cervical adenopathy present.  Neurological:     Mental Status: She is alert and oriented to person, place, and time. Mental status is at baseline.      UC Treatments / Results  Labs (all labs ordered are listed, but only abnormal results are displayed) Labs  Reviewed  POCT RAPID STREP A (OFFICE) - Normal  EKG   Radiology No results found.  Procedures Procedures (including critical care time)  Medications Ordered in UC Medications - No data to display  Initial Impression / Assessment and Plan / UC Course  I have reviewed the triage vital signs and the nursing notes.  Pertinent labs & imaging results that were available during my care of the patient were reviewed by me and considered in my medical decision making (see chart for details).  Acute URI  Patient is in no signs of distress nor toxic appearing.  Vital signs are stable.  Low suspicion for pneumonia, pneumothorax or bronchitis and therefore will defer imaging.  Strep test negative.  Prescribed Augmentin , Tessalon and Promethazine  DM.May use additional over-the-counter medications as needed for supportive care.  May follow-up with urgent care as needed if symptoms persist or worsen.   Final Clinical Impressions(s) / UC Diagnoses   Final diagnoses:  Acute URI   Discharge Instructions      Begin Augmentin  twice daily for bacterial coverage  May use Tessalon pill every 8 hours as needed for cough, may use cough syrup at bedtime for additional comfort    You can take Tylenol  and/or Ibuprofen as needed for fever reduction and pain relief.   For cough: honey 1/2 to 1 teaspoon (you can dilute the honey in water or another fluid).  You can also use guaifenesin  and dextromethorphan  for cough. You can use a humidifier for chest congestion and cough.  If you don't have a humidifier, you can sit in the bathroom with the hot shower running.      For sore throat: try warm salt water gargles, cepacol lozenges, throat spray, warm tea or water with lemon/honey, popsicles or ice, or OTC cold relief medicine for throat discomfort.   For congestion: take a daily anti-histamine like Zyrtec, Claritin, and a oral decongestant, such as pseudoephedrine.  You can also use Flonase  1-2 sprays in  each nostril daily.   It is important to stay hydrated: drink plenty of fluids (water, gatorade/powerade/pedialyte, juices, or teas) to keep your throat moisturized and help further relieve irritation/discomfort.   ED Prescriptions     Medication Sig Dispense Auth. Provider   amoxicillin -clavulanate (AUGMENTIN ) 875-125 MG tablet Take 1 tablet by mouth every 12 (twelve) hours. 14 tablet Eleanor Dimichele R, NP   benzonatate (TESSALON) 100 MG capsule Take 1 capsule (100 mg total) by mouth every 8 (eight) hours. 21 capsule Atalie Oros R, NP   promethazine -dextromethorphan  (PROMETHAZINE -DM) 6.25-15 MG/5ML syrup Take 5 mLs by mouth at bedtime as needed. 118 mL Jaimee Corum, Maybelle Spatz, NP      PDMP not reviewed this encounter.   Reena Canning, [place] 04/07/24 [phone]

## 2024-04-23 ENCOUNTER — Other Ambulatory Visit (HOSPITAL_COMMUNITY): Payer: Self-pay

## 2024-04-25 ENCOUNTER — Encounter: Payer: Self-pay | Admitting: Family Medicine

## 2024-04-25 ENCOUNTER — Other Ambulatory Visit (HOSPITAL_COMMUNITY): Payer: Self-pay

## 2024-04-25 ENCOUNTER — Ambulatory Visit: Admitting: Family Medicine

## 2024-04-25 VITALS — BP 114/71 | HR 71

## 2024-04-25 DIAGNOSIS — E6609 Other obesity due to excess calories: Secondary | ICD-10-CM | POA: Diagnosis not present

## 2024-04-25 DIAGNOSIS — Z7689 Persons encountering health services in other specified circumstances: Secondary | ICD-10-CM

## 2024-04-25 MED ORDER — PHENTERMINE HCL 37.5 MG PO TABS
37.5000 mg | ORAL_TABLET | Freq: Every day | ORAL | 0 refills | Status: DC
  Filled 2024-04-25: qty 30, 30d supply, fill #0

## 2024-04-25 NOTE — Progress Notes (Unsigned)
 Established Patient Office Visit  Subjective    Patient ID: Michele Houston, female    DOB: 04/18/1986  Age: 38 y.o. MRN: [id number]  CC:  Chief Complaint  Patient presents with   Go over lab work     HPI [name] presents for inquiring about weight loss management. Patient denies acute complaints.   Outpatient Encounter Medications as of 04/25/2024  Medication Sig   ALPRAZolam  (XANAX ) 0.25 MG tablet Take 1-2 tablets (0.25-0.5 mg total) by mouth daily as needed for anxiety.   cetirizine (ZYRTEC) 10 MG tablet Take 10 mg by mouth daily.   omeprazole  (PRILOSEC) 20 MG capsule Take 1 capsule (20 mg total) by mouth daily.   phentermine (ADIPEX-P) 37.5 MG tablet Take 1 tablet (37.5 mg total) by mouth daily before breakfast.   sertraline  (ZOLOFT ) 50 MG tablet Take 1 tablet (50 mg total) by mouth daily. Take one tablet daily starting day 14 of menstrual cycle & continue for 14 days   amoxicillin -clavulanate (AUGMENTIN ) 875-125 MG tablet Take 1 tablet by mouth every 12 (twelve) hours.   benzonatate  (TESSALON ) 100 MG capsule Take 1 capsule (100 mg total) by mouth every 8 (eight) hours.   promethazine -dextromethorphan  (PROMETHAZINE -DM) 6.25-15 MG/5ML syrup Take 5 mLs by mouth at bedtime as needed.   No facility-administered encounter medications on file as of 04/25/2024.    Past Medical History:  Diagnosis Date   Anemia    when pregnant only   GERD (gastroesophageal reflux disease)     Past Surgical History:  Procedure Laterality Date   CHOLECYSTECTOMY N/A 06/03/2015   Procedure: LAPAROSCOPIC CHOLECYSTECTOMY WITH INTRAOPERATIVE CHOLANGIOGRAM;  Surgeon: Reyes LELON Cota, MD;  Location: [facility] ORS;  Service: General;  Laterality: N/A;   KNEE SURGERY Left 2011   arthroscopy   tubes in ears  baby    Family History  Problem Relation Age of Onset   Hyperthyroidism Mother    Depression Mother    Lung cancer Father    Depression Father    Drug abuse Father    Cervical cancer Sister     Depression Sister    Bipolar disorder Sister    Hypertension Brother    Lung cancer Paternal Grandmother    Lung cancer Paternal Uncle     Social History   Socioeconomic History   Marital status: Married    Spouse name: Not on file   Number of children: 3   Years of education: Not on file   Highest education level: Some college, no degree  Occupational History   Occupation: Chief Executive Officer: [employer]  Tobacco Use   Smoking status: Former    Current packs/day: 0.00    Average packs/day: 1 pack/day for 4.0 years (4.0 ttl pk-yrs)    Types: Cigarettes    Start date: 05/30/2007    Quit date: 05/30/2011    Years since quitting: 12.9   Smokeless tobacco: Never  Vaping Use   Vaping status: Never Used  Substance and Sexual Activity   Alcohol use: Not Currently    Alcohol/week: 0.0 standard drinks of alcohol   Drug use: Never   Sexual activity: Yes    Birth control/protection: None    Comment: Vasectomy in husband  Other Topics Concern   Not on file  Social History Narrative   Not on file   Social Drivers of Health   Financial Resource Strain: Low Risk  (04/23/2024)   Overall Financial Resource Strain (CARDIA)    Difficulty of Paying  Living Expenses: Not very hard  Food Insecurity: No Food Insecurity (04/23/2024)   Hunger Vital Sign    Worried About Running Out of Food in the Last Year: Never true    Ran Out of Food in the Last Year: Never true  Transportation Needs: No Transportation Needs (04/23/2024)   PRAPARE - Administrator, Civil Service (Medical): No    Lack of Transportation (Non-Medical): No  Physical Activity: Inactive (04/23/2024)   Exercise Vital Sign    Days of Exercise per Week: 0 days    Minutes of Exercise per Session: Not on file  Stress: No Stress Concern Present (04/23/2024)   [employer] of Occupational Health - Occupational Stress Questionnaire    Feeling of Stress: Not at all  Recent Concern: Stress - Stress Concern  Present (01/26/2024)   [employer] of Occupational Health - Occupational Stress Questionnaire    Feeling of Stress : To some extent  Social Connections: Socially Integrated (04/23/2024)   Social Connection and Isolation Panel    Frequency of Communication with Friends and Family: Three times a week    Frequency of Social Gatherings with Friends and Family: Once a week    Attends Religious Services: More than 4 times per year    Active Member of [employer] or Organizations: Yes    Attends Engineer, structural: More than 4 times per year    Marital Status: Married  Catering manager Violence: Not on file    Review of Systems  All other systems reviewed and are negative.       Objective    BP 114/71   Pulse 71   LMP 04/07/2024 (Exact Date)   SpO2 96%   Physical Exam Vitals and nursing note reviewed.  Constitutional:      General: She is not in acute distress.    Appearance: She is obese.   Cardiovascular:     Rate and Rhythm: Normal rate and regular rhythm.  Pulmonary:     Effort: Pulmonary effort is normal.     Breath sounds: Normal breath sounds.  Abdominal:     Palpations: Abdomen is soft.     Tenderness: There is no abdominal tenderness.   Neurological:     General: No focal deficit present.     Mental Status: She is alert and oriented to person, place, and time.         Assessment & Plan:   1. Encounter for weight management (Primary) Phentermine prescribed  2. Obesity due to excess calories, unspecified class, unspecified whether serious comorbidity present   Return in about 4 weeks (around 05/23/2024) for follow up.   Tanda Raguel SQUIBB, MD

## 2024-04-26 ENCOUNTER — Other Ambulatory Visit (HOSPITAL_COMMUNITY): Payer: Self-pay

## 2024-04-26 ENCOUNTER — Encounter: Payer: Self-pay | Admitting: Family Medicine

## 2024-04-27 ENCOUNTER — Other Ambulatory Visit (HOSPITAL_COMMUNITY): Payer: Self-pay

## 2024-04-27 ENCOUNTER — Other Ambulatory Visit: Payer: Self-pay | Admitting: Family Medicine

## 2024-05-03 ENCOUNTER — Other Ambulatory Visit: Payer: Self-pay

## 2024-05-03 MED ORDER — OMEPRAZOLE 20 MG PO CPDR
20.0000 mg | DELAYED_RELEASE_CAPSULE | Freq: Every day | ORAL | 0 refills | Status: DC
  Filled 2024-05-03: qty 60, 60d supply, fill #0

## 2024-05-14 ENCOUNTER — Encounter: Payer: Self-pay | Admitting: Family Medicine

## 2024-05-29 ENCOUNTER — Ambulatory Visit: Admitting: Family Medicine

## 2024-06-13 ENCOUNTER — Encounter: Payer: Self-pay | Admitting: Podiatry

## 2024-06-13 ENCOUNTER — Ambulatory Visit (INDEPENDENT_AMBULATORY_CARE_PROVIDER_SITE_OTHER): Admitting: Podiatry

## 2024-06-13 ENCOUNTER — Other Ambulatory Visit: Payer: Self-pay

## 2024-06-13 ENCOUNTER — Other Ambulatory Visit (HOSPITAL_COMMUNITY): Payer: Self-pay

## 2024-06-13 ENCOUNTER — Encounter: Payer: Self-pay | Admitting: Pharmacist

## 2024-06-13 ENCOUNTER — Ambulatory Visit (INDEPENDENT_AMBULATORY_CARE_PROVIDER_SITE_OTHER)

## 2024-06-13 VITALS — Ht 67.0 in | Wt 228.9 lb

## 2024-06-13 DIAGNOSIS — M722 Plantar fascial fibromatosis: Secondary | ICD-10-CM | POA: Diagnosis not present

## 2024-06-13 DIAGNOSIS — M7751 Other enthesopathy of right foot: Secondary | ICD-10-CM | POA: Diagnosis not present

## 2024-06-13 MED ORDER — METHYLPREDNISOLONE 4 MG PO TBPK
ORAL_TABLET | ORAL | 0 refills | Status: DC
  Filled 2024-06-13: qty 21, 6d supply, fill #0

## 2024-06-13 MED ORDER — BETAMETHASONE SOD PHOS & ACET 6 (3-3) MG/ML IJ SUSP
3.0000 mg | Freq: Once | INTRAMUSCULAR | Status: AC
  Administered 2024-06-13 (×2): 3 mg via INTRA_ARTICULAR

## 2024-06-13 MED ORDER — MELOXICAM 15 MG PO TABS
15.0000 mg | ORAL_TABLET | Freq: Every day | ORAL | 1 refills | Status: DC
Start: 2024-06-13 — End: 2024-09-06
  Filled 2024-06-13 (×2): qty 60, 60d supply, fill #0
  Filled 2024-08-07: qty 60, 60d supply, fill #1

## 2024-06-13 NOTE — Progress Notes (Signed)
   Chief Complaint  Patient presents with   Foot Pain    Pt is her due to right foot pain states that she has had pain in the foot for years but for the last week the pain has been constant, she has taken meloxicam , ices her foot, went to [employer] got shoes and insoles.    Subjective: 38 y.o. female presenting today as a new patient for evaluation of right heel pain.  She has a chronic history of intermittent plantar fasciitis over the past several years however over the past week it has been exacerbated   Past Medical History:  Diagnosis Date   Anemia    when pregnant only   GERD (gastroesophageal reflux disease)     Past Surgical History:  Procedure Laterality Date   CHOLECYSTECTOMY N/A 06/03/2015   Procedure: LAPAROSCOPIC CHOLECYSTECTOMY WITH INTRAOPERATIVE CHOLANGIOGRAM;  Surgeon: Reyes LELON Cota, MD;  Location: [facility] ORS;  Service: General;  Laterality: N/A;   KNEE SURGERY Left 2011   arthroscopy   tubes in ears  baby    Allergies  Allergen Reactions   Ceclor [Cefaclor] Hives   Septra [Sulfamethoxazole-Trimethoprim] Nausea And Vomiting   Sulfamethoxazole    Trimethoprim    Objective: Physical Exam General: The patient is alert and oriented x3 in no acute distress.  Dermatology: Skin is warm, dry and supple bilateral lower extremities. Negative for open lesions or macerations bilateral.   Vascular: Dorsalis Pedis and Posterior Tibial pulses palpable bilateral.  Capillary fill time is immediate to all digits.  Neurological: Grossly intact via light touch  Musculoskeletal: Tenderness to palpation to the plantar aspect of the right heel along the plantar fascia. All other joints range of motion within normal limits bilateral. Strength 5/5 in all groups bilateral.   Radiographic exam RT foot  : Normal osseous mineralization. Joint spaces preserved. No fracture/dislocation/boney destruction. No other soft tissue abnormalities or radiopaque foreign bodies.    Assessment: 1. Plantar fasciitis right  Plan of Care:  -Patient evaluated. Xrays reviewed.   -Injection of 0.5cc Celestone  soluspan injected into the right plantar fascia  -Prescription for Medrol  Dosepak -Prescription for meloxicam  15 mg daily after completion of the Dosepak -Advised against going barefoot.  Recommended supportive tennis shoes and sneakers -Recommend daily calf stretching, specifically calf stretches to alleviate posterior calf tightness -Return to clinic 4 weeks  *Pharmacy tech at [facility]  Thresa EMERSON Sar, DPM [facility]  Dr. Thresa EMERSON Sar, DPM    [address] [place], [place] [address]                Office [phone]  Fax [phone]

## 2024-06-25 ENCOUNTER — Other Ambulatory Visit: Payer: Self-pay | Admitting: Family Medicine

## 2024-06-25 ENCOUNTER — Other Ambulatory Visit (HOSPITAL_COMMUNITY): Payer: Self-pay

## 2024-06-25 MED ORDER — OMEPRAZOLE 20 MG PO CPDR
20.0000 mg | DELAYED_RELEASE_CAPSULE | Freq: Every day | ORAL | 0 refills | Status: DC
  Filled 2024-06-25: qty 60, 60d supply, fill #0

## 2024-07-11 ENCOUNTER — Ambulatory Visit (INDEPENDENT_AMBULATORY_CARE_PROVIDER_SITE_OTHER): Admitting: Podiatry

## 2024-07-11 ENCOUNTER — Encounter: Payer: Self-pay | Admitting: Podiatry

## 2024-07-11 VITALS — Ht 67.0 in | Wt 228.9 lb

## 2024-07-11 DIAGNOSIS — M722 Plantar fascial fibromatosis: Secondary | ICD-10-CM | POA: Diagnosis not present

## 2024-07-11 MED ORDER — BETAMETHASONE SOD PHOS & ACET 6 (3-3) MG/ML IJ SUSP
3.0000 mg | Freq: Once | INTRAMUSCULAR | Status: AC
  Administered 2024-07-11: 3 mg via INTRA_ARTICULAR

## 2024-07-11 NOTE — Progress Notes (Signed)
   Chief Complaint  Patient presents with   Plantar Fasciitis    Pt is here to f/u on right foot pain due to plantar fasciitis, she states the pain in her foot has not gotten better, more pain in the heel of the foot now, she received injection into the foot on her last visit, states that helped part of the foot, complains of hip pain that began when the foot pain started thinks they could be related. She continues to take meloxicam  states it takes the edge off, says she uses foot massager and stretches foot to relieve some pain.    Subjective: 38 y.o. female presenting today for follow-up evaluation of chronic right heel pain intermittently over the last several years.   Past Medical History:  Diagnosis Date   Anemia    when pregnant only   GERD (gastroesophageal reflux disease)     Past Surgical History:  Procedure Laterality Date   CHOLECYSTECTOMY N/A 06/03/2015   Procedure: LAPAROSCOPIC CHOLECYSTECTOMY WITH INTRAOPERATIVE CHOLANGIOGRAM;  Surgeon: Reyes LELON Cota, MD;  Location: [facility] ORS;  Service: General;  Laterality: N/A;   KNEE SURGERY Left 2011   arthroscopy   tubes in ears  baby    Allergies  Allergen Reactions   Ceclor [Cefaclor] Hives   Septra [Sulfamethoxazole-Trimethoprim] Nausea And Vomiting   Sulfamethoxazole    Trimethoprim    Objective: Physical Exam General: The patient is alert and oriented x3 in no acute distress.  Dermatology: Skin is warm, dry and supple bilateral lower extremities. Negative for open lesions or macerations bilateral.   Vascular: Dorsalis Pedis and Posterior Tibial pulses palpable bilateral.  Capillary fill time is immediate to all digits.  Neurological: Grossly intact via light touch  Musculoskeletal: Tenderness to palpation to the plantar lateral posterior aspect of the right heel along the plantar fascia.  The area of the previous injection there is no pain or tenderness to palpation.  Which was more on the plantar medial aspect of  the heel.  All other joints range of motion within normal limits bilateral. Strength 5/5 in all groups bilateral.   Radiographic exam RT foot 06/13/2024: Normal osseous mineralization. Joint spaces preserved. No fracture/dislocation/boney destruction. No other soft tissue abnormalities or radiopaque foreign bodies.   Assessment: 1. Plantar fasciitis right  Plan of Care:  -Patient evaluated. Xrays reviewed.   -Injection of 0.5cc Celestone  soluspan injected into the right plantar fascia more along the posterior plantar lateral aspect of the heel - Continue meloxicam  15 mg daily as needed - Continue wearing good supportive sneakers and recovery slides - OTC power step insoles were dispensed.  Wear daily to support the medial longitudinal arch of the foot -Night splint was also dispensed.  Wear in the evenings to keep the foot in a stretched out position while resting and healing -Return to clinic 4 weeks  *Pharmacy tech at [facility]  Thresa EMERSON Sar, [facility]  Dr. Thresa EMERSON Sar, DPM    [address] [place], [place] [address]                Office [phone]  Fax [phone]

## 2024-07-24 ENCOUNTER — Other Ambulatory Visit: Payer: Self-pay | Admitting: Certified Nurse Midwife

## 2024-07-24 ENCOUNTER — Other Ambulatory Visit (HOSPITAL_COMMUNITY): Payer: Self-pay

## 2024-07-24 ENCOUNTER — Other Ambulatory Visit: Payer: Self-pay

## 2024-07-24 MED ORDER — SERTRALINE HCL 50 MG PO TABS
50.0000 mg | ORAL_TABLET | Freq: Every day | ORAL | 1 refills | Status: DC
  Filled 2024-07-24: qty 45, 90d supply, fill #0

## 2024-07-30 ENCOUNTER — Other Ambulatory Visit: Payer: Self-pay | Admitting: Certified Nurse Midwife

## 2024-07-30 ENCOUNTER — Encounter: Payer: Self-pay | Admitting: Certified Nurse Midwife

## 2024-07-30 ENCOUNTER — Other Ambulatory Visit: Payer: Self-pay

## 2024-07-30 MED ORDER — SERTRALINE HCL 50 MG PO TABS
50.0000 mg | ORAL_TABLET | Freq: Every day | ORAL | 1 refills | Status: AC
  Filled 2024-07-30 – 2024-09-06 (×2): qty 90, 90d supply, fill #0
  Filled 2024-11-26 – 2024-12-05 (×3): qty 90, 90d supply, fill #1

## 2024-07-30 NOTE — Progress Notes (Signed)
 Daily Zoloft  vs dosing prior to menses ordered. Patient reports mood improved on taking before menses & desires to take daily.

## 2024-08-07 ENCOUNTER — Other Ambulatory Visit: Payer: Self-pay

## 2024-08-13 ENCOUNTER — Ambulatory Visit: Admitting: Podiatry

## 2024-08-30 ENCOUNTER — Telehealth: Admitting: Physician Assistant

## 2024-08-30 DIAGNOSIS — G8929 Other chronic pain: Secondary | ICD-10-CM

## 2024-08-30 NOTE — Progress Notes (Signed)
  Because of ongoing back pain and need for examination and ongoing management, I feel your condition warrants further evaluation and I recommend that you be seen in a face-to-face visit.   NOTE: There will be NO CHARGE for this E-Visit   If you are having a true medical emergency, please call 911.     For an urgent face to face visit, [facility] has multiple urgent care centers for your convenience.  Click the link below for the full list of locations and hours, walk-in wait times, appointment scheduling options and driving directions:  Urgent Care - [place], [place], [place], [place], [place], [place]  [place]     Your MyChart E-visit questionnaire answers were reviewed by a board certified advanced clinical practitioner to complete your personal care plan based on your specific symptoms.    Thank you for using e-Visits.

## 2024-09-03 ENCOUNTER — Other Ambulatory Visit: Payer: Self-pay

## 2024-09-03 ENCOUNTER — Other Ambulatory Visit: Payer: Self-pay | Admitting: Family Medicine

## 2024-09-03 ENCOUNTER — Other Ambulatory Visit (HOSPITAL_COMMUNITY): Payer: Self-pay

## 2024-09-03 MED ORDER — OMEPRAZOLE 20 MG PO CPDR
20.0000 mg | DELAYED_RELEASE_CAPSULE | Freq: Every day | ORAL | 0 refills | Status: DC
  Filled 2024-09-03: qty 60, 60d supply, fill #0

## 2024-09-06 ENCOUNTER — Other Ambulatory Visit: Payer: Self-pay

## 2024-09-06 ENCOUNTER — Telehealth: Admitting: Physician Assistant

## 2024-09-06 ENCOUNTER — Other Ambulatory Visit (HOSPITAL_COMMUNITY): Payer: Self-pay

## 2024-09-06 DIAGNOSIS — M549 Dorsalgia, unspecified: Secondary | ICD-10-CM

## 2024-09-06 MED ORDER — CYCLOBENZAPRINE HCL 10 MG PO TABS
5.0000 mg | ORAL_TABLET | Freq: Three times a day (TID) | ORAL | 0 refills | Status: DC | PRN
  Filled 2024-09-06 (×2): qty 30, 10d supply, fill #0

## 2024-09-06 MED ORDER — NAPROXEN 500 MG PO TABS
500.0000 mg | ORAL_TABLET | Freq: Two times a day (BID) | ORAL | 0 refills | Status: DC
  Filled 2024-09-06 (×2): qty 30, 15d supply, fill #0

## 2024-09-06 NOTE — Progress Notes (Signed)

## 2024-09-10 ENCOUNTER — Other Ambulatory Visit (HOSPITAL_COMMUNITY): Payer: Self-pay

## 2024-09-10 ENCOUNTER — Ambulatory Visit: Admitting: Orthopedic Surgery

## 2024-09-10 ENCOUNTER — Other Ambulatory Visit: Payer: Self-pay

## 2024-09-10 DIAGNOSIS — M5441 Lumbago with sciatica, right side: Secondary | ICD-10-CM

## 2024-09-10 DIAGNOSIS — M6701 Short Achilles tendon (acquired), right ankle: Secondary | ICD-10-CM | POA: Diagnosis not present

## 2024-09-10 DIAGNOSIS — G8929 Other chronic pain: Secondary | ICD-10-CM

## 2024-09-10 MED ORDER — PREDNISONE 10 MG PO TABS
10.0000 mg | ORAL_TABLET | Freq: Every day | ORAL | 0 refills | Status: DC
  Filled 2024-09-10 (×2): qty 30, 30d supply, fill #0

## 2024-09-11 ENCOUNTER — Encounter: Payer: Self-pay | Admitting: Orthopedic Surgery

## 2024-09-12 ENCOUNTER — Encounter: Payer: Self-pay | Admitting: Orthopedic Surgery

## 2024-09-12 NOTE — Progress Notes (Signed)
 Office Visit Note   Patient: Michele Houston           Date of Birth: [birth year]           MRN: [id number] Visit Date: 09/10/2024              Requested by: Tanda Bleacher, MD [address] [place],  [place] [address] PCP: Tanda Bleacher, MD  Chief Complaint  Patient presents with   Lower Back - Pain      HPI: Discussed the use of AI scribe software for clinical note transcription with the patient, who gave verbal consent to proceed.  History of Present Illness Michele Houston is a 38 year old female who presents with right lateral back pain radiating to the right buttocks and foot.  She has been experiencing right lateral back pain that radiates to the right buttocks and down to the right foot. The pain is described as deep and constant, disrupting her sleep over the past few weeks. Although the pain has been present for about a year and a half, it has worsened in the last three weeks. Massages provide temporary relief, but the pain returns shortly after.  She also reports heel pain, primarily on the bottom of her heel, which worsens with standing or walking for extended periods. She has received two injections from a foot doctor previously, which provided some relief. She is concerned about the connection between her leg pain and heel pain.  She has a history of a fractured pelvis 14 years ago and experienced sciatic pain during pregnancy. She lives in [place] and works in [place] every other week.     Assessment & Plan: Visit Diagnoses:  1. Chronic right-sided low back pain with right-sided sciatica     Plan: Assessment and Plan Assessment & Plan Right lumbosacral radicular pain (sciatica) Chronic right lateral back pain radiating to the right buttocks and down to the right foot, consistent with sciatic nerve irritation. Radiographs show normal lordosis, no periarticular bony spurs, no pars defect, and no spondylolisthesis. Negative straight leg raise and no  focal motor weakness in lower extremities. - Prescribed prednisone  10 mg with breakfast. - Initiated physical therapy. - Scheduled follow-up in four weeks.  Right Achilles insertional pain due to tendon tightness Pain localized at the insertion of the Achilles tendon on the calcaneus, exacerbated by standing or walking for prolonged periods. Achilles tendon tightness noted with dorsiflexion just short of neutral with knee extended. - Instructed on Achilles stretching exercises using stairs, three times a day for one minute each session.      Follow-Up Instructions: No follow-ups on file.   Ortho Exam  Patient is alert, oriented, no adenopathy, well-dressed, normal affect, normal respiratory effort. Physical Exam MUSCULOSKELETAL: Negative straight leg raise, no focal motor weakness in lower extremities. Achilles tightness with dorsiflexion just short of neutral with knee extended. Achilles tendon and plantar fascia not tender to palpation. Tender at insertion of Achilles on calcaneus.      Imaging: No results found. No images are attached to the encounter.  Labs: Lab Results  Component Value Date   HGBA1C 5.6 05/08/2020     Lab Results  Component Value Date   ALBUMIN 4.5 03/12/2024   ALBUMIN 4.7 05/08/2020   ALBUMIN 4.5 03/29/2019    No results found for: MG Lab Results  Component Value Date   VD25OH 33.73 05/08/2020    No results found for: PREALBUMIN    Latest Ref Rng & Units  03/12/2024    9:39 AM 05/08/2020    3:17 PM 03/29/2019    8:46 AM  CBC EXTENDED  WBC 3.4 - 10.8 x10E3/uL 6.4  6.5  5.0   RBC 3.77 - 5.28 x10E6/uL 5.13  4.64  4.99   Hemoglobin 11.1 - 15.9 g/dL 85.0  87.9  85.7   HCT 34.0 - 46.6 % 46.8  36.5  41.9   Platelets 150 - 450 x10E3/uL 356  289.0  311.0   NEUT# 1.4 - 7.0 x10E3/uL 3.9  3.7  2.9   Lymph# 0.7 - 3.1 x10E3/uL 2.0  2.2  1.6      There is no height or weight on file to calculate BMI.  Orders:  Orders Placed This Encounter   Procedures   XR Lumbar Spine 2-3 Views   Ambulatory referral to Physical Therapy   Meds ordered this encounter  Medications   predniSONE  (DELTASONE ) 10 MG tablet    Sig: Take 1 tablet (10 mg total) by mouth daily with breakfast.    Dispense:  30 tablet    Refill:  0     Procedures: No procedures performed  Clinical Data: No additional findings.  ROS:  All other systems negative, except as noted in the HPI. Review of Systems  Objective: Vital Signs: There were no vitals taken for this visit.  Specialty Comments:  No specialty comments available.  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   MDD (major depressive disorder), recurrent, in full remission 07/16/2020   BMI 29.0-29.9,adult 05/08/2020   Heart palpitations 05/08/2020   Need for hepatitis C screening test 05/08/2020   Migraine 12/29/2018   Anxiety and depression 12/06/2018   Anxiety 12/06/2018   Hemorrhoids 12/08/2015   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   Recurrent major depressive disorder 08/11/2015   Past Medical History:  Diagnosis Date   Anemia    when pregnant only   GERD (gastroesophageal reflux disease)     Family History  Problem Relation Age of Onset   Hyperthyroidism Mother    Depression Mother    Lung cancer Father    Depression Father    Drug abuse Father    Cervical cancer Sister    Depression Sister    Bipolar disorder Sister    Hypertension Brother    Lung cancer Paternal Grandmother    Lung cancer Paternal Uncle     Past Surgical History:  Procedure Laterality Date   CHOLECYSTECTOMY N/A 06/03/2015   Procedure: LAPAROSCOPIC  CHOLECYSTECTOMY WITH INTRAOPERATIVE CHOLANGIOGRAM;  Surgeon: Reyes LELON Cota, MD;  Location: [facility] ORS;  Service: General;  Laterality: N/A;   KNEE SURGERY Left 2011   arthroscopy   tubes in ears  baby   Social History   Occupational History   Occupation: Chief Executive Officer: [employer]  Tobacco Use   Smoking status: Former    Current packs/day: 0.00    Average packs/day: 1 pack/day for 4.0 years (4.0 ttl pk-yrs)    Types: Cigarettes    Start date: 05/30/2007    Quit date: 05/30/2011    Years since quitting: 13.2   Smokeless tobacco: Never  Vaping Use   Vaping status: Never Used  Substance  and Sexual Activity   Alcohol use: Not Currently    Alcohol/week: 0.0 standard drinks of alcohol   Drug use: Never   Sexual activity: Yes    Birth control/protection: None    Comment: Vasectomy in husband

## 2024-09-24 NOTE — Therapy (Signed)
 OUTPATIENT PHYSICAL THERAPY THORACOLUMBAR EVALUATION   Patient Name: Michele Houston MRN: [id number] DOB:Jun 06, 1986, 38 y.o., female Today's Date: 09/25/2024  END OF SESSION:  PT End of Session - 09/25/24 0703     Visit Number 1    Number of Visits 6    Date for Recertification  11/06/24    Authorization Type [place] [employer] $25 COPAY    Progress Note Due on Visit 6    PT Start Time 0714    PT Stop Time 0755    PT Time Calculation (min) 41 min    Activity Tolerance Patient tolerated treatment well    Behavior During Therapy [facility] for tasks assessed/performed          Past Medical History:  Diagnosis Date   Anemia    when pregnant only   GERD (gastroesophageal reflux disease)    Past Surgical History:  Procedure Laterality Date   CHOLECYSTECTOMY N/A 06/03/2015   Procedure: LAPAROSCOPIC CHOLECYSTECTOMY WITH INTRAOPERATIVE CHOLANGIOGRAM;  Surgeon: Reyes LELON Cota, MD;  Location: [facility] ORS;  Service: General;  Laterality: N/A;   KNEE SURGERY Left 2011   arthroscopy   tubes in ears  baby   Patient Active Problem List   Diagnosis Date Noted   Carpal tunnel syndrome of right wrist 06/13/2023   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   Panic disorder 09/22/2020   URI with cough and congestion 09/12/2020   Acute non-recurrent sinusitis 09/12/2020   GAD (generalized anxiety disorder) 07/16/2020   Attention deficit hyperactivity disorder (ADHD), predominantly inattentive type 07/16/2020   MDD (major depressive disorder), recurrent, in full remission 07/16/2020   BMI 29.0-29.9,adult 05/08/2020   Heart palpitations 05/08/2020   Need for hepatitis C screening test 05/08/2020   Migraine 12/29/2018   Anxiety and depression 12/06/2018   Anxiety 12/06/2018   Hemorrhoids 12/08/2015   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   Recurrent major depressive disorder 08/11/2015    PCP: Raguel Blush, MD  REFERRING PROVIDER: Jerona Sage, MD  REFERRING DIAG: Diagnosis M54.41,G89.29 (ICD-10-CM) - Chronic  right-sided low back pain with right-sided sciatica  Rationale for Evaluation and Treatment: Rehabilitation  THERAPY DIAG:  Pain in right hip  Other low back pain  Sciatica, right side  Muscle weakness (generalized)  Other abnormalities of gait and mobility  ONSET DATE: chronic (1.5 years ago)   SUBJECTIVE:                                                                                                                                                                                           SUBJECTIVE STATEMENT: It feels more like muscle.  PERTINENT HISTORY:  Patient endorses that her pain is more  in her Rt hip (posterior and lateral) with intermittent low back pain. Patient states that it started after sitting in the floor one time and has consistently gotten worse over last 1.5 years. Patient endorses that exercise and massage used to help, but now it is short term relief and pain has become more consistent. Patient did fracture Rt side of pelvis 14 years ago but has not had any residual deficits since. Patient has used prednisone  in the past and recently finished a round that assisted with pain short term. Patient intermittently experiencing sciatica-like n/t down Rt leg and into Rt foot.   See PMH and personal factors for in depth comorbidities   PAIN:  Are you having pain? Yes: NPRS scale: 1/10 at rest; 8/10 at max  Pain location: Rt hip (lateral) Pain description: sharp ache  Aggravating factors: sitting on the floor, sleeping on that side, standing for prolonged periods, walking long distances  Relieving factors: massage   PRECAUTIONS: None  RED FLAGS: Bowel or bladder incontinence: No and Cauda equina syndrome: No   WEIGHT BEARING RESTRICTIONS: No  FALLS:  Has patient fallen in last 6 months? 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  DIAGNOSTIC FINDINGS:  2 view radiographs of the lumbar spine shows normal lordosis without  periarticular bony spurs.  There were no pars defect no spondylolisthesis.   PATIENT SURVEYS:  PSFS: THE PATIENT SPECIFIC FUNCTIONAL SCALE  Place score of 0-10 (0 = unable to perform activity and 10 = able to perform activity at the same level as before injury or problem)  Activity Date: 09/25/2024    Exercise without pain   5    2. Walking   7    3. Sleep through the night   5    4.      Total Score 5.67      Total Score = Sum of activity scores/number of activities  Minimally Detectable Change: 3 points (for single activity); 2 points (for average score)  [facility] (nd). The Patient Specific Functional Scale . Retrieved from [email or web address]   COGNITION: Overall cognitive status: Within functional limits for tasks assessed     SENSATION: Light touch: WFL  MUSCLE LENGTH:   POSTURE: rounded shoulders and increased thoracic kyphosis  PALPATION: Highly TTP in Rt piriformis and Rt glutes ; not TTP around Rt greater trochanter or on Lt side   LUMBAR ROM:   AROM Eval 09/25/2024  Flexion WFL  Extension WFL , Rt SI pain   Right lateral flexion WFL  Left lateral flexion WFL  Right rotation [facility]  Left rotation WFL   (Blank rows = not tested)  LOWER EXTREMITY ROM:     Passive  Right Eval 09/25/2024 Left Eval 09/25/2024  Hip flexion [facility]  Hip extension    Hip abduction    Hip adduction    Hip internal rotation [facility] WFL  Hip external rotation WFL, painful WFL  Knee flexion    Knee extension    Ankle dorsiflexion    Ankle plantarflexion     Ankle inversion    Ankle eversion     (Blank rows =  not tested)  LOWER EXTREMITY MMT:    MMT Right Eval 09/25/2024 Left Eval 09/25/2024  Hip flexion (seated) 5/5 5/5  Hip extension (prone) 4/5 4/5, painful in Rt piriformis  Hip abduction (sidelying) 4+/5, painful 5/5  Hip adduction    Hip internal rotation    Hip external rotation    Knee flexion (seated) 5/5 5/5  Knee extension (seated) 5/5 5/5  Ankle dorsiflexion (seated) 5/5 5/5  Ankle plantarflexion    Ankle inversion    Ankle eversion     (Blank rows = not tested)   GAIT: Distance walked: not objectively measured  Assistive device utilized: None Level of assistance: supervision  Comments: no overt gait deviations noted   TREATMENT DATE:  09/25/2024 TherEx:  HEP handout provided with patient performing on set of each activity for appropriate form. Verbal and tactile cues provided.   Self-Care:  POC, dry needling, long-term effects of previous fractures on pain and strength/flexibility     Manual:  STM to Rt piriformis and Rt glutes  PT provided tennis ball to go with HEP for self-STM to Rt glutes/piriformis   Dry Needling:  Trigger Point Dry Needling  Initial Treatment: Pt instructed on Dry Needling rational, procedures, and possible side effects. Pt instructed to expect mild to moderate muscle soreness later in the day and/or into the next day.  Pt instructed in methods to reduce muscle soreness. Pt instructed to continue prescribed HEP. Patient verbalized understanding of these instructions and education.   Patient Verbal Consent Given: Yes Education Handout Provided: Yes Muscles Treated: Rt piriformis, Rt glute max  Electrical Stimulation Performed: No Treatment Response/Outcome: improved tenderness with palpation,                                                                                                                                 PATIENT EDUCATION:  Education details: HEP, POC, dry  needling Person educated: Patient Education method: Explanation, Demonstration, Tactile cues, Verbal cues, and Handouts Education comprehension: verbalized understanding, returned demonstration, verbal cues required, and tactile cues required  HOME EXERCISE PROGRAM: Access Code: [email or web address] URL: [email or web address] Date: 09/25/2024 Prepared by: Susannah Daring  Exercises - Seated Figure 4 Piriformis Stretch  - 1 x daily - 7 x weekly - 2 sets - 30sec hold - Seated Piriformis Stretch  - 1 x daily - 7 x weekly - 2 sets - 30sec hold - Seated Hamstring Stretch  - 1 x daily - 7 x weekly - 2 sets - 30sec hold - Standing Piriformis Release with Ball at Wall  - 7 x weekly - 2 sets - 60sec hold  ASSESSMENT:  CLINICAL IMPRESSION: Patient is a 38 y.o. F who was seen today for physical therapy evaluation and treatment for chronic low back pain with Rt sided sciatica presenting with Rt hip pain, generalized strength deficits, and intermittent n/t. Patient is mainly limited secondary to pain in Rt hip/piriformis. Patient tolerated dry needling this date noting  soreness, though improved pain with palpation. Patient will benefit from skilled PT to address above noted deficits.   OBJECTIVE IMPAIRMENTS: decreased activity tolerance, decreased mobility, difficulty walking, improper body mechanics, postural dysfunction, and pain.   ACTIVITY LIMITATIONS: sitting, standing, and sleeping  PARTICIPATION LIMITATIONS: community activity and occupation  PERSONAL FACTORS: Time since onset of injury/illness/exacerbation and 1-2 comorbidities: Anemia, GERD are also affecting patient's functional outcome.   REHAB POTENTIAL: Excellent  CLINICAL DECISION MAKING: Stable/uncomplicated  EVALUATION COMPLEXITY: Low   GOALS: Goals reviewed with patient? Yes  SHORT TERM GOALS: Target date: 10/16/2024  Patient will show compliance with initial HEP. Baseline: Goal status: INITIAL  2.  Patient will  report pain levels no greater than 5/10 in order to show an improved overall quality of life. Baseline:  Goal status: INITIAL    LONG TERM GOALS: Target date: 11/06/2024  Patient will be independent with final HEP in order to maintain and progress upon functional gains made within PT. Baseline:  Goal status: INITIAL  2.  Patient will report pain levels no greater than 3/10 in order to show an improved overall quality of life. Baseline:  Goal status: INITIAL  3.  Patient will increase PSFS to at least 7.67 in order to show a significant improvement in subjective disability rating. Baseline:  Goal status: INITIAL  4.  Patient will report ability to walk more than a mile without increasing pain above baseline. Baseline:  Goal status: INITIAL    PLAN:  PT FREQUENCY: 1x/week  PT DURATION: 6 weeks  PLANNED INTERVENTIONS: 97164- PT Re-evaluation, 97750- Physical Performance Testing, 97110-Therapeutic exercises, 97530- Therapeutic activity, [id number]- Neuromuscular re-education, 97535- Self Care, 02859- Manual therapy, [id number]- Gait training, [phone]- Orthotic Initial, [phone]- Orthotic/Prosthetic subsequent, [phone]- Canalith repositioning, [phone]- Aquatic Therapy, [phone]- Electrical stimulation (unattended), [phone]- Electrical stimulation (manual), [id number]- Vasopneumatic device, [id number]- Ultrasound, [id number]- Traction (mechanical), [id number]- Ionotophoresis 4mg /ml Dexamethasone , 79439 (1-2 muscles), 20561 (3+ muscles)- Dry Needling, Patient/Family education, Balance training, Stair training, Taping, Joint mobilization, Joint manipulation, Spinal manipulation, Spinal mobilization, Vestibular training, DME instructions, Cryotherapy, and Moist heat.  PLAN FOR NEXT SESSION: DN, manual, flexibility, generalized strengthening and endurance    Susannah Daring, PT, DPT 09/25/24 8:57 AM

## 2024-09-25 ENCOUNTER — Ambulatory Visit

## 2024-09-25 DIAGNOSIS — M5431 Sciatica, right side: Secondary | ICD-10-CM | POA: Diagnosis not present

## 2024-09-25 DIAGNOSIS — R2689 Other abnormalities of gait and mobility: Secondary | ICD-10-CM | POA: Diagnosis not present

## 2024-09-25 DIAGNOSIS — M25551 Pain in right hip: Secondary | ICD-10-CM

## 2024-09-25 DIAGNOSIS — M6281 Muscle weakness (generalized): Secondary | ICD-10-CM

## 2024-09-25 DIAGNOSIS — M5459 Other low back pain: Secondary | ICD-10-CM

## 2024-10-08 ENCOUNTER — Encounter: Payer: Self-pay | Admitting: Orthopedic Surgery

## 2024-10-08 ENCOUNTER — Ambulatory Visit: Admitting: Orthopedic Surgery

## 2024-10-08 ENCOUNTER — Ambulatory Visit: Admitting: Physical Therapy

## 2024-10-08 ENCOUNTER — Encounter: Payer: Self-pay | Admitting: Physical Therapy

## 2024-10-08 DIAGNOSIS — M5441 Lumbago with sciatica, right side: Secondary | ICD-10-CM | POA: Diagnosis not present

## 2024-10-08 DIAGNOSIS — M5459 Other low back pain: Secondary | ICD-10-CM

## 2024-10-08 DIAGNOSIS — M25551 Pain in right hip: Secondary | ICD-10-CM

## 2024-10-08 DIAGNOSIS — R2689 Other abnormalities of gait and mobility: Secondary | ICD-10-CM | POA: Diagnosis not present

## 2024-10-08 DIAGNOSIS — G8929 Other chronic pain: Secondary | ICD-10-CM

## 2024-10-08 DIAGNOSIS — M6281 Muscle weakness (generalized): Secondary | ICD-10-CM

## 2024-10-08 DIAGNOSIS — M5431 Sciatica, right side: Secondary | ICD-10-CM

## 2024-10-08 NOTE — Progress Notes (Signed)
 Office Visit Note   Patient: Michele Houston           Date of Birth: [birth year]           MRN: [id number] Visit Date: 10/08/2024              Requested by: Tanda Bleacher, MD [address] [place],  [place] [address] PCP: Tanda Bleacher, MD  Chief Complaint  Patient presents with   Lower Back - Follow-up      HPI: Discussed the use of AI scribe software for clinical note transcription with the patient, who gave verbal consent to proceed.  History of Present Illness Michele Houston is a 38 year old female who presents with persistent heel pain.  She experiences significant heel pain, particularly when walking, despite recent dry needling therapy which provided some relief. The pain is localized to the heel and does not occur with palpation. She has been diagnosed with a bone spur following an X-ray by a foot doctor.  She has been performing stretching exercises, including those targeting the Achilles tendon, and using a massage gun and heating pad to alleviate symptoms. She is currently on prednisone  and plans to wean off as symptoms improve.  She reports radicular symptoms in the lateral right thigh.     Assessment & Plan: Visit Diagnoses:  1. Chronic right-sided low back pain with right-sided sciatica     Plan: Assessment and Plan Assessment & Plan Right plantar fasciitis Chronic right plantar fasciitis with heel pain exacerbated by walking. Bone spur confirmed by previous x-ray. Improvement with dry needling and stretching exercises. - Continue physical therapy focusing on Achilles tendon stretching. - Wean off prednisone  as symptoms improve. - Use heat therapy to relax muscles and reduce nerve irritation.  Right lumbar radiculopathy with lateral thigh symptoms Persistent lateral right thigh symptoms with negative sciatic tension sign. Improvement with current therapy. MRI considered if no improvement after six weeks, with potential for epidural steroid  injection if necessary. - Continue physical therapy. - Consider MRI if no improvement after six weeks. - Discuss potential for epidural steroid injection if MRI indicates necessity.      Follow-Up Instructions: Return if symptoms worsen or fail to improve.   Ortho Exam  Patient is alert, oriented, no adenopathy, well-dressed, normal affect, normal respiratory effort. Physical Exam MUSCULOSKELETAL: Plantar fascia not tender to palpation on right foot. Dorsiflexion just past neutral with knee extended. NEUROLOGICAL: Negative sciatic tension sign.      Imaging: No results found.   Labs: Lab Results  Component Value Date   HGBA1C 5.6 05/08/2020     Lab Results  Component Value Date   ALBUMIN 4.5 03/12/2024   ALBUMIN 4.7 05/08/2020   ALBUMIN 4.5 03/29/2019    No results found for: MG Lab Results  Component Value Date   VD25OH 33.73 05/08/2020    No results found for: PREALBUMIN    Latest Ref Rng & Units 03/12/2024    9:39 AM 05/08/2020    3:17 PM 03/29/2019    8:46 AM  CBC EXTENDED  WBC 3.4 - 10.8 x10E3/uL 6.4  6.5  5.0   RBC 3.77 - 5.28 x10E6/uL 5.13  4.64  4.99   Hemoglobin 11.1 - 15.9 g/dL 85.0  87.9  85.7   HCT 34.0 - 46.6 % 46.8  36.5  41.9   Platelets 150 - 450 x10E3/uL 356  289.0  311.0   NEUT# 1.4 - 7.0 x10E3/uL 3.9  3.7  2.9  Lymph# 0.7 - 3.1 x10E3/uL 2.0  2.2  1.6      There is no height or weight on file to calculate BMI.  Orders:  No orders of the defined types were placed in this encounter.  No orders of the defined types were placed in this encounter.    Procedures: No procedures performed  Clinical Data: No additional findings.  ROS:  All other systems negative, except as noted in the HPI. Review of Systems  Objective: Vital Signs: There were no vitals taken for this visit.  Specialty Comments:  No specialty comments available.  PMFS History: Patient Active Problem List   Diagnosis Date Noted   Carpal tunnel syndrome  of right wrist 06/13/2023   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   Panic disorder 09/22/2020   URI with cough and congestion 09/12/2020   Acute non-recurrent sinusitis 09/12/2020   GAD (generalized anxiety disorder) 07/16/2020   Attention deficit hyperactivity disorder (ADHD), predominantly inattentive type 07/16/2020   MDD (major depressive disorder), recurrent, in full remission 07/16/2020   BMI 29.0-29.9,adult 05/08/2020   Heart palpitations 05/08/2020   Need for hepatitis C screening test 05/08/2020   Migraine 12/29/2018   Anxiety and depression 12/06/2018   Anxiety 12/06/2018   Hemorrhoids 12/08/2015   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   Recurrent major depressive disorder 08/11/2015   Past Medical History:  Diagnosis Date   Anemia    when pregnant only   GERD (gastroesophageal reflux disease)     Family History  Problem Relation Age of Onset   Hyperthyroidism Mother    Depression Mother    Lung cancer Father    Depression Father    Drug abuse Father    Cervical cancer Sister    Depression Sister    Bipolar disorder Sister    Hypertension Brother    Lung cancer Paternal Grandmother    Lung cancer Paternal Uncle     Past Surgical History:  Procedure Laterality Date   CHOLECYSTECTOMY N/A 06/03/2015   Procedure: LAPAROSCOPIC CHOLECYSTECTOMY WITH INTRAOPERATIVE CHOLANGIOGRAM;  Surgeon: Reyes LELON Cota, MD;  Location: [facility] ORS;  Service: General;  Laterality: N/A;   KNEE SURGERY Left 2011   arthroscopy   tubes in ears  baby   Social History   Occupational History   Occupation: Chief Executive Officer: [employer]  Tobacco Use   Smoking status: Former    Current packs/day: 0.00    Average packs/day: 1 pack/day for 4.0 years (4.0 ttl pk-yrs)    Types: Cigarettes    Start date: 05/30/2007    Quit date: 05/30/2011    Years since quitting: 13.3   Smokeless tobacco: Never  Vaping Use   Vaping status: Never Used  Substance and Sexual Activity   Alcohol use: Not Currently     Alcohol/week: 0.0 standard drinks of alcohol   Drug use: Never   Sexual activity: Yes    Birth control/protection: None    Comment: Vasectomy in husband

## 2024-10-08 NOTE — Therapy (Signed)
 OUTPATIENT PHYSICAL THERAPY THORACOLUMBAR EVALUATION   Patient Name: Michele Houston MRN: [id number] DOB:1986/07/02, 38 y.o., female Today's Date: 10/08/2024  END OF SESSION:  PT End of Session - 10/08/24 1100     Visit Number 2    Number of Visits 6    Date for Recertification  11/06/24    Authorization Type [place] [employer] [id number] COPAY    Progress Note Due on Visit 6    PT Start Time 1015    PT Stop Time 1100    PT Time Calculation (min) 45 min    Activity Tolerance Patient tolerated treatment well    Behavior During Therapy WFL for tasks assessed/performed           Past Medical History:  Diagnosis Date   Anemia    when pregnant only   GERD (gastroesophageal reflux disease)    Past Surgical History:  Procedure Laterality Date   CHOLECYSTECTOMY N/A 06/03/2015   Procedure: LAPAROSCOPIC CHOLECYSTECTOMY WITH INTRAOPERATIVE CHOLANGIOGRAM;  Surgeon: Reyes LELON Cota, MD;  Location: [facility] ORS;  Service: General;  Laterality: N/A;   KNEE SURGERY Left 2011   arthroscopy   tubes in ears  baby   Patient Active Problem List   Diagnosis Date Noted   Carpal tunnel syndrome of right wrist 06/13/2023   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   Panic disorder 09/22/2020   URI with cough and congestion 09/12/2020   Acute non-recurrent sinusitis 09/12/2020   GAD (generalized anxiety disorder) 07/16/2020   Attention deficit hyperactivity disorder (ADHD), predominantly inattentive type 07/16/2020   MDD (major depressive disorder), recurrent, in full remission 07/16/2020   BMI 29.0-29.9,adult 05/08/2020   Heart palpitations 05/08/2020   Need for hepatitis C screening test 05/08/2020   Migraine 12/29/2018   Anxiety and depression 12/06/2018   Anxiety 12/06/2018   Hemorrhoids 12/08/2015   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   Recurrent major depressive disorder 08/11/2015    PCP: Raguel Blush, MD  REFERRING PROVIDER: Jerona Sage, MD  REFERRING DIAG: Diagnosis M54.41,G89.29 (ICD-10-CM) - Chronic  right-sided low back pain with right-sided sciatica  Rationale for Evaluation and Treatment: Rehabilitation  THERAPY DIAG:  Other low back pain  Sciatica, right side  Muscle weakness (generalized)  Other abnormalities of gait and mobility  Pain in right hip  ONSET DATE: chronic (1.5 years ago)   SUBJECTIVE:                                                                                                                                                                                           SUBJECTIVE STATEMENT: Pt arriving today reporting 1/10 pain . Pt stating DN went well and wishes to  have it again.   PERTINENT HISTORY:  Patient endorses that her pain is more in her Rt hip (posterior and lateral) with intermittent low back pain. Patient states that it started after sitting in the floor one time and has consistently gotten worse over last 1.5 years. Patient endorses that exercise and massage used to help, but now it is short term relief and pain has become more consistent. Patient did fracture Rt side of pelvis 14 years ago but has not had any residual deficits since. Patient has used prednisone  in the past and recently finished a round that assisted with pain short term. Patient intermittently experiencing sciatica-like n/t down Rt leg and into Rt foot.   See PMH and personal factors for in depth comorbidities   PAIN:  Are you having pain? Yes: NPRS scale: 1/10 at rest; 8/10 at max  Pain location: Rt hip (lateral) Pain description: sharp ache  Aggravating factors: sitting on the floor, sleeping on that side, standing for prolonged periods, walking long distances  Relieving factors: massage   PRECAUTIONS: None  RED FLAGS: Bowel or bladder incontinence: No and Cauda equina syndrome: No   WEIGHT BEARING RESTRICTIONS: No  FALLS:  Has patient fallen in last 6 months? 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  DIAGNOSTIC FINDINGS:  2 view radiographs of the lumbar spine shows normal lordosis without  periarticular bony spurs.  There were no pars defect no spondylolisthesis.   PATIENT SURVEYS:  PSFS: THE PATIENT SPECIFIC FUNCTIONAL SCALE  Place score of 0-10 (0 = unable to perform activity and 10 = able to perform activity at the same level as before injury or problem)  Activity Date: 09/25/2024    Exercise without pain   5    2. Walking   7    3. Sleep through the night   5    4.      Total Score 5.67      Total Score = Sum of activity scores/number of activities  Minimally Detectable Change: 3 points (for single activity); 2 points (for average score)  [facility] (nd). The Patient Specific Functional Scale . Retrieved from [email or web address]   COGNITION: Overall cognitive status: Within functional limits for tasks assessed     SENSATION: Light touch: WFL  MUSCLE LENGTH:   POSTURE: rounded shoulders and increased thoracic kyphosis  PALPATION: Highly TTP in Rt piriformis and Rt glutes ; not TTP around Rt greater trochanter or on Lt side   LUMBAR ROM:   AROM Eval 09/25/2024  Flexion WFL  Extension WFL , Rt SI pain   Right lateral flexion WFL  Left lateral flexion WFL  Right rotation [facility]  Left rotation WFL   (Blank rows = not tested)  LOWER EXTREMITY ROM:     Passive  Right Eval 09/25/2024 Left Eval 09/25/2024  Hip flexion [facility]  Hip extension    Hip abduction    Hip adduction    Hip internal rotation [facility] WFL  Hip external rotation WFL, painful WFL  Knee flexion     Knee extension    Ankle dorsiflexion    Ankle plantarflexion  Ankle inversion    Ankle eversion     (Blank rows = not tested)  LOWER EXTREMITY MMT:    MMT Right Eval 09/25/2024 Left Eval 09/25/2024  Hip flexion (seated) 5/5 5/5  Hip extension (prone) 4/5 4/5, painful in Rt piriformis  Hip abduction (sidelying) 4+/5, painful 5/5  Hip adduction    Hip internal rotation    Hip external rotation    Knee flexion (seated) 5/5 5/5  Knee extension (seated) 5/5 5/5  Ankle dorsiflexion (seated) 5/5 5/5  Ankle plantarflexion    Ankle inversion    Ankle eversion     (Blank rows = not tested)   GAIT: Distance walked: not objectively measured  Assistive device utilized: None Level of assistance: supervision  Comments: no overt gait deviations noted   TREATMENT DATE:  10/08/2024 TherEx:  Supine: bridge 2 x 10 holding 5 sec Side lying: hip circles both directions 2 x 10  Side lying reverse clams: 2 x 10  Abdominal contractions sitting with UE pressing on red physio- ball 2 x 10 holding 5 sec Self Care:  we discussed pelvic floor exercises and kegals Manual:  STM to Rt glute medius and Rt lateral hip TherActivites:  Sit to stand x 2 c ball squeeze and abdominal  Opposite arm to knee on green physio-ball 2 x 20  Trigger Point Dry Needling:  Initial Treatment: Pt instructed on Dry Needling rational, procedures, and possible side effects. Pt instructed to expect mild to moderate muscle soreness later in the day and/or into the next day.  Pt instructed in methods to reduce muscle soreness. Pt instructed to continue prescribed HEP. Patient verbalized understanding of these instructions and education.   Patient Verbal Consent Given: Yes Education Handout Provided: Yes Muscles Treated: Rt glute medius and Rt lateral hip Electrical Stimulation Performed: No Treatment Response/Outcome: improved tenderness with palpation   09/25/2024 TherEx:  HEP handout provided with  patient performing on set of each activity for appropriate form. Verbal and tactile cues provided.   Self-Care:  POC, dry needling, long-term effects of previous fractures on pain and strength/flexibility     Manual:  STM to Rt piriformis and Rt glutes  PT provided tennis ball to go with HEP for self-STM to Rt glutes/piriformis   Dry Needling:  Trigger Point Dry Needling  Initial Treatment: Pt instructed on Dry Needling rational, procedures, and possible side effects. Pt instructed to expect mild to moderate muscle soreness later in the day and/or into the next day.  Pt instructed in methods to reduce muscle soreness. Pt instructed to continue prescribed HEP. Patient verbalized understanding of these instructions and education.   Patient Verbal Consent Given: Yes Education Handout Provided: Yes Muscles Treated: Rt piriformis, Rt glute max  Electrical Stimulation Performed: No Treatment Response/Outcome: improved tenderness with palpation,  PATIENT EDUCATION:  Education details: HEP, POC, dry needling Person educated: Patient Education method: Explanation, Demonstration, Tactile cues, Verbal cues, and Handouts Education comprehension: verbalized understanding, returned demonstration, verbal cues required, and tactile cues required  HOME EXERCISE PROGRAM: Access Code: [email or web address] URL: [email or web address] Date: 09/25/2024 Prepared by: Susannah Daring  Exercises - Seated Figure 4 Piriformis Stretch  - 1 x daily - 7 x weekly - 2 sets - 30sec hold - Seated Piriformis Stretch  - 1 x daily - 7 x weekly - 2 sets - 30sec hold - Seated Hamstring Stretch  - 1 x daily - 7 x weekly - 2 sets - 30sec hold - Standing Piriformis Release with Ball at Wall  - 7 x weekly - 2 sets - 60sec hold  ASSESSMENT:  CLINICAL IMPRESSION: Pt with good response to DN  at her last visit and wishing to try again. We discussed pelvic floor strengthening this visit. Pt with good response to DN and pt was instructed to use heat as well as continue with her HEP stretches. Recommending continued skilled PT.   OBJECTIVE IMPAIRMENTS: decreased activity tolerance, decreased mobility, difficulty walking, improper body mechanics, postural dysfunction, and pain.   ACTIVITY LIMITATIONS: sitting, standing, and sleeping  PARTICIPATION LIMITATIONS: community activity and occupation  PERSONAL FACTORS: Time since onset of injury/illness/exacerbation and 1-2 comorbidities: Anemia, GERD are also affecting patient's functional outcome.   REHAB POTENTIAL: Excellent  CLINICAL DECISION MAKING: Stable/uncomplicated  EVALUATION COMPLEXITY: Low   GOALS: Goals reviewed with patient? Yes  SHORT TERM GOALS: Target date: 10/16/2024  Patient will show compliance with initial HEP. Baseline: Goal status: on-going 10/08/24  2.  Patient will report pain levels no greater than 5/10 in order to show an improved overall quality of life. Baseline:  Goal status: 10/08/24, better today   LONG TERM GOALS: Target date: 11/06/2024  Patient will be independent with final HEP in order to maintain and progress upon functional gains made within PT. Baseline:  Goal status: INITIAL  2.  Patient will report pain levels no greater than 3/10 in order to show an improved overall quality of life. Baseline:  Goal status: INITIAL  3.  Patient will increase PSFS to at least 7.67 in order to show a significant improvement in subjective disability rating. Baseline:  Goal status: INITIAL  4.  Patient will report ability to walk more than a mile without increasing pain above baseline. Baseline:  Goal status: INITIAL    PLAN:  PT FREQUENCY: 1x/week  PT DURATION: 6 weeks  PLANNED INTERVENTIONS: 97164- PT Re-evaluation, 97750- Physical Performance Testing, 97110-Therapeutic exercises, 97530-  Therapeutic activity, [id number]- Neuromuscular re-education, 97535- Self Care, [id number]- Manual therapy, [id number]- Gait training, [phone]- Orthotic Initial, [phone]- Orthotic/Prosthetic subsequent, [phone]- Canalith repositioning, [phone]- Aquatic Therapy, [phone]- Electrical stimulation (unattended), [phone]- Electrical stimulation (manual), [id number]- Vasopneumatic device, [id number]- Ultrasound, [id number]- Traction (mechanical), [id number]- Ionotophoresis 4mg /ml Dexamethasone , 79439 (1-2 muscles), [id number] (3+ muscles)- Dry Needling, Patient/Family education, Balance training, Stair training, Taping, Joint mobilization, Joint manipulation, Spinal manipulation, Spinal mobilization, Vestibular training, DME instructions, Cryotherapy, and Moist heat.  PLAN FOR NEXT SESSION: DN as needed, manual, flexibility, generalized strengthening and endurance  Response to DN  Delon Lunger, PT, MPT 10/08/24 11:05 AM   10/08/24 11:05 AM

## 2024-10-15 ENCOUNTER — Ambulatory Visit: Admitting: Physical Therapy

## 2024-10-15 ENCOUNTER — Encounter: Payer: Self-pay | Admitting: Physical Therapy

## 2024-10-15 DIAGNOSIS — M6281 Muscle weakness (generalized): Secondary | ICD-10-CM | POA: Diagnosis not present

## 2024-10-15 DIAGNOSIS — R2689 Other abnormalities of gait and mobility: Secondary | ICD-10-CM

## 2024-10-15 DIAGNOSIS — M5431 Sciatica, right side: Secondary | ICD-10-CM

## 2024-10-15 DIAGNOSIS — M25551 Pain in right hip: Secondary | ICD-10-CM | POA: Diagnosis not present

## 2024-10-15 DIAGNOSIS — M5459 Other low back pain: Secondary | ICD-10-CM

## 2024-10-15 NOTE — Therapy (Signed)
 OUTPATIENT PHYSICAL THERAPY THORACOLUMBAR   Patient Name: Michele Houston MRN: [id number] DOB:[birth year], 38 y.o., female Today's Date: 10/15/2024  END OF SESSION:  PT End of Session - 10/15/24 0951     Visit Number 3    Number of Visits 6    Date for Recertification  11/06/24    Authorization Type [place] [employer] $25 COPAY    Progress Note Due on Visit 6    PT Start Time 0915    PT Stop Time 1000    PT Time Calculation (min) 45 min    Activity Tolerance Patient tolerated treatment well    Behavior During Therapy WFL for tasks assessed/performed            Past Medical History:  Diagnosis Date   Anemia    when pregnant only   GERD (gastroesophageal reflux disease)    Past Surgical History:  Procedure Laterality Date   CHOLECYSTECTOMY N/A 06/03/2015   Procedure: LAPAROSCOPIC CHOLECYSTECTOMY WITH INTRAOPERATIVE CHOLANGIOGRAM;  Surgeon: Reyes LELON Cota, MD;  Location: [facility] ORS;  Service: General;  Laterality: N/A;   KNEE SURGERY Left 2011   arthroscopy   tubes in ears  baby   Patient Active Problem List   Diagnosis Date Noted   Carpal tunnel syndrome of right wrist 06/13/2023   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   Panic disorder 09/22/2020   URI with cough and congestion 09/12/2020   Acute non-recurrent sinusitis 09/12/2020   GAD (generalized anxiety disorder) 07/16/2020   Attention deficit hyperactivity disorder (ADHD), predominantly inattentive type 07/16/2020   MDD (major depressive disorder), recurrent, in full remission 07/16/2020   BMI 29.0-29.9,adult 05/08/2020   Heart palpitations 05/08/2020   Need for hepatitis C screening test 05/08/2020   Migraine 12/29/2018   Anxiety and depression 12/06/2018   Anxiety 12/06/2018   Hemorrhoids 12/08/2015   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   Recurrent major depressive disorder 08/11/2015    PCP: Raguel Blush, MD  REFERRING PROVIDER: Jerona Sage, MD  REFERRING DIAG: Diagnosis M54.41,G89.29 (ICD-10-CM) - Chronic right-sided  low back pain with right-sided sciatica  Rationale for Evaluation and Treatment: Rehabilitation  THERAPY DIAG:  Other low back pain  Sciatica, right side  Muscle weakness (generalized)  Other abnormalities of gait and mobility  Pain in right hip  ONSET DATE: chronic (1.5 years ago)   SUBJECTIVE:                                                                                                                                                                                           SUBJECTIVE STATEMENT: Pt reporting pain today 1/10 in her low back and left lateral thigh.  PERTINENT HISTORY:  Patient endorses that her pain is more in her Rt hip (posterior and lateral) with intermittent low back pain. Patient states that it started after sitting in the floor one time and has consistently gotten worse over last 1.5 years. Patient endorses that exercise and massage used to help, but now it is short term relief and pain has become more consistent. Patient did fracture Rt side of pelvis 14 years ago but has not had any residual deficits since. Patient has used prednisone  in the past and recently finished a round that assisted with pain short term. Patient intermittently experiencing sciatica-like n/t down Rt leg and into Rt foot.   See PMH and personal factors for in depth comorbidities   PAIN:  Are you having pain? Yes: NPRS scale: 1/10 today  Pain location: Rt hip (lateral) Pain description: sharp ache  Aggravating factors: sitting on the floor, sleeping on that side, standing for prolonged periods, walking long distances  Relieving factors: massage   PRECAUTIONS: None  RED FLAGS: Bowel or bladder incontinence: No and Cauda equina syndrome: No   WEIGHT BEARING RESTRICTIONS: No  FALLS:  Has patient fallen in last 6 months? 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  DIAGNOSTIC FINDINGS:  2 view radiographs of the lumbar spine shows normal lordosis without  periarticular bony spurs.  There were no pars defect no spondylolisthesis.   PATIENT SURVEYS:  PSFS: THE PATIENT SPECIFIC FUNCTIONAL SCALE  Place score of 0-10 (0 = unable to perform activity and 10 = able to perform activity at the same level as before injury or problem)  Activity Date: 09/25/2024 10/15/24    Exercise without pain   5 7   2. Walking (everyday activity walking)  7 7   3. Sleep through the night   5 8   4.      Total Score 5.67 7.3     Total Score = Sum of activity scores/number of activities  Minimally Detectable Change: 3 points (for single activity); 2 points (for average score)  [facility] (nd). The Patient Specific Functional Scale . Retrieved from [email or web address]   COGNITION: Overall cognitive status: Within functional limits for tasks assessed     SENSATION: Light touch: WFL  MUSCLE LENGTH:   POSTURE: rounded shoulders and increased thoracic kyphosis  PALPATION: Highly TTP in Rt piriformis and Rt glutes ; not TTP around Rt greater trochanter or on Lt side   LUMBAR ROM:   AROM Eval 09/25/2024 10/15/24  Flexion WFL 65  Extension WFL , Rt SI pain  20 with mild discomfort  Right lateral flexion WFL 56  Left lateral flexion WFL 54  Right rotation [facility] WFL   Left rotation [facility] WFL, mild discomfort down left flank   (Blank rows = not tested)  LOWER EXTREMITY ROM:     Passive  Right Eval 09/25/2024 Left Eval 09/25/2024  Hip flexion [facility]  Hip extension    Hip abduction    Hip adduction    Hip internal  rotation [facility] WFL  Hip external rotation WFL, painful WFL  Knee flexion    Knee extension  Ankle dorsiflexion    Ankle plantarflexion    Ankle inversion    Ankle eversion     (Blank rows = not tested)  LOWER EXTREMITY MMT:    MMT Right Eval 09/25/2024 Left Eval 09/25/2024  Hip flexion (seated) 5/5 5/5  Hip extension (prone) 4/5 4/5, painful in Rt piriformis  Hip abduction (sidelying) 4+/5, painful 5/5  Hip adduction    Hip internal rotation    Hip external rotation    Knee flexion (seated) 5/5 5/5  Knee extension (seated) 5/5 5/5  Ankle dorsiflexion (seated) 5/5 5/5  Ankle plantarflexion    Ankle inversion    Ankle eversion     (Blank rows = not tested)   GAIT: Distance walked: not objectively measured  Assistive device utilized: None Level of assistance: supervision  Comments: no overt gait deviations noted   TREATMENT DATE:  10/15/24 TherEx:  Seated glute stretch x 3 bil LE holding 30 sec Seated piriformis stretch x 3 holding 30 sec Discussed seated hamstring stretch for home after needling TherActivites:  Sit to stand x 12 c 10# kettle bell  Double leg press: 75# 2 x 15 slow eccentrics Rows: blue TB x 15 holding 3 sec Manual:  STM to Rt piriformis and Rt hamstrings  Trigger Point Dry Needling:  Initial Treatment: Pt instructed on Dry Needling rational, procedures, and possible side effects. Pt instructed to expect mild to moderate muscle soreness later in the day and/or into the next day.  Pt instructed in methods to reduce muscle soreness. Pt instructed to continue prescribed HEP. Patient verbalized understanding of these instructions and education.   Patient Verbal Consent Given: Yes Education Handout Provided: Yes Muscles Treated: Rt piriformis, Rt hamstrings Electrical Stimulation Performed: No Treatment Response/Outcome: improved tenderness with palpation   10/08/2024 TherEx:  Supine: bridge 2 x 10 holding 5 sec Side lying: hip circles both  directions 2 x 10  Side lying reverse clams: 2 x 10  Abdominal contractions sitting with UE pressing on red physio- ball 2 x 10 holding 5 sec Self Care:  we discussed pelvic floor exercises and kegals Manual:  STM to Rt glute medius and Rt lateral hip TherActivites:  Sit to stand x 2 c ball squeeze and abdominal  Opposite arm to knee on green physio-ball 2 x 20  Trigger Point Dry Needling:  Initial Treatment: Pt instructed on Dry Needling rational, procedures, and possible side effects. Pt instructed to expect mild to moderate muscle soreness later in the day and/or into the next day.  Pt instructed in methods to reduce muscle soreness. Pt instructed to continue prescribed HEP. Patient verbalized understanding of these instructions and education.   Patient Verbal Consent Given: Yes Education Handout Provided: Yes Muscles Treated: Rt glute medius and Rt lateral hip Electrical Stimulation Performed: No Treatment Response/Outcome: improved tenderness with palpation   09/25/2024 TherEx:  HEP handout provided with patient performing on set of each activity for appropriate form. Verbal and tactile cues provided.   Self-Care:  POC, dry needling, long-term effects of previous fractures on pain and strength/flexibility     Manual:  STM to Rt piriformis and Rt glutes  PT provided tennis ball to go with HEP for self-STM to Rt glutes/piriformis   Dry Needling:  Trigger Point Dry Needling  Initial Treatment: Pt instructed on Dry Needling rational, procedures, and possible side effects. Pt instructed to expect mild to moderate muscle soreness later in the day and/or into the next day.  Pt instructed in methods to reduce  muscle soreness. Pt instructed to continue prescribed HEP. Patient verbalized understanding of these instructions and education.   Patient Verbal Consent Given: Yes Education Handout Provided: Yes Muscles Treated: Rt piriformis, Rt glute max  Electrical Stimulation  Performed: No Treatment Response/Outcome: improved tenderness with palpation,                                                                                                                               PATIENT EDUCATION:  Education details: HEP, POC, dry needling Person educated: Patient Education method: Explanation, Demonstration, Tactile cues, Verbal cues, and Handouts Education comprehension: verbalized understanding, returned demonstration, verbal cues required, and tactile cues required  HOME EXERCISE PROGRAM: Access Code: [email or web address] URL: [email or web address] Date: 09/25/2024 Prepared by: Susannah Daring  Exercises - Seated Figure 4 Piriformis Stretch  - 1 x daily - 7 x weekly - 2 sets - 30sec hold - Seated Piriformis Stretch  - 1 x daily - 7 x weekly - 2 sets - 30sec hold - Seated Hamstring Stretch  - 1 x daily - 7 x weekly - 2 sets - 30sec hold - Standing Piriformis Release with Ball at Wall  - 7 x weekly - 2 sets - 60sec hold  ASSESSMENT:  CLINICAL IMPRESSION: Pt with good compliance with her HEP. Pt with good response to DN at her last 2 visits. Pt did report some pain in her low back 2 days following her last visit. This visit we repeated DN of different muscles where pt was identified with active trigger points. Pt tolerating well. Recommending continued skilled PT interventions.      OBJECTIVE IMPAIRMENTS: decreased activity tolerance, decreased mobility, difficulty walking, improper body mechanics, postural dysfunction, and pain.   ACTIVITY LIMITATIONS: sitting, standing, and sleeping  PARTICIPATION LIMITATIONS: community activity and occupation  PERSONAL FACTORS: Time since onset of injury/illness/exacerbation and 1-2 comorbidities: Anemia, GERD are also affecting patient's functional outcome.   REHAB POTENTIAL: Excellent  CLINICAL DECISION MAKING: Stable/uncomplicated  EVALUATION COMPLEXITY: Low   GOALS: Goals reviewed with patient?  Yes  SHORT TERM GOALS: Target date: 10/16/2024  Patient will show compliance with initial HEP. Baseline: Goal status: MET 10/15/24  2.  Patient will report pain levels no greater than 5/10 in order to show an improved overall quality of life. Baseline:  Goal status: MET 10/15/24 most days   LONG TERM GOALS: Target date: 11/06/2024  Patient will be independent with final HEP in order to maintain and progress upon functional gains made within PT. Baseline:  Goal status: INITIAL  2.  Patient will report pain levels no greater than 3/10 in order to show an improved overall quality of life. Baseline:  Goal status: INITIAL  3.  Patient will increase PSFS to at least 7.67 in order to show a significant improvement in subjective disability rating. Baseline:  Goal status: on-going 10/15/24 (7.3)  4.  Patient will report ability to walk more than a mile without increasing pain above baseline. Baseline:  Goal status: INITIAL    PLAN:  PT FREQUENCY: 1x/week  PT DURATION: 6 weeks  PLANNED INTERVENTIONS: 97164- PT Re-evaluation, 97750- Physical Performance Testing, 97110-Therapeutic exercises, 97530- Therapeutic activity, [id number]- Neuromuscular re-education, 97535- Self Care, [id number]- Manual therapy, [id number]- Gait training, [phone]- Orthotic Initial, [phone]- Orthotic/Prosthetic subsequent, [phone]- Canalith repositioning, [phone]- Aquatic Therapy, [phone]- Electrical stimulation (unattended), [phone]- Electrical stimulation (manual), [id number]- Vasopneumatic device, [id number]- Ultrasound, [id number]- Traction (mechanical), [id number]- Ionotophoresis 4mg /ml Dexamethasone , 79439 (1-2 muscles), [id number] (3+ muscles)- Dry Needling, Patient/Family education, Balance training, Stair training, Taping, Joint mobilization, Joint manipulation, Spinal manipulation, Spinal mobilization, Vestibular training, DME instructions, Cryotherapy, and Moist heat.  PLAN FOR NEXT SESSION: DN as needed, manual, flexibility, generalized core  strengthening  Response to DN again    Delon Lunger, PT, MPT 10/15/2024 10:09 AM   10/15/2024 10:09 AM

## 2024-10-22 ENCOUNTER — Encounter: Payer: Self-pay | Admitting: Physical Therapy

## 2024-10-22 ENCOUNTER — Ambulatory Visit: Admitting: Physical Therapy

## 2024-10-22 DIAGNOSIS — R2689 Other abnormalities of gait and mobility: Secondary | ICD-10-CM

## 2024-10-22 DIAGNOSIS — M5431 Sciatica, right side: Secondary | ICD-10-CM

## 2024-10-22 DIAGNOSIS — M5459 Other low back pain: Secondary | ICD-10-CM | POA: Diagnosis not present

## 2024-10-22 DIAGNOSIS — M6281 Muscle weakness (generalized): Secondary | ICD-10-CM

## 2024-10-22 NOTE — Therapy (Signed)
 " OUTPATIENT PHYSICAL THERAPY THORACOLUMBAR   Patient Name: Michele Houston MRN: [id number] DOB:04/06/1986, 38 y.o., female Today's Date: 10/22/2024  END OF SESSION:  PT End of Session - 10/22/24 0801     Visit Number 4    Number of Visits 6    Date for Recertification  11/06/24    Authorization Type [place] [employer] $25 COPAY    Progress Note Due on Visit 6    PT Start Time 0800    PT Stop Time 0837    PT Time Calculation (min) 37 min    Activity Tolerance Patient tolerated treatment well    Behavior During Therapy WFL for tasks assessed/performed             Past Medical History:  Diagnosis Date   Anemia    when pregnant only   GERD (gastroesophageal reflux disease)    Past Surgical History:  Procedure Laterality Date   CHOLECYSTECTOMY N/A 06/03/2015   Procedure: LAPAROSCOPIC CHOLECYSTECTOMY WITH INTRAOPERATIVE CHOLANGIOGRAM;  Surgeon: Reyes LELON Cota, MD;  Location: [facility] ORS;  Service: General;  Laterality: N/A;   KNEE SURGERY Left 2011   arthroscopy   tubes in ears  baby   Patient Active Problem List   Diagnosis Date Noted   Carpal tunnel syndrome of right wrist 06/13/2023   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   Panic disorder 09/22/2020   URI with cough and congestion 09/12/2020   Acute non-recurrent sinusitis 09/12/2020   GAD (generalized anxiety disorder) 07/16/2020   Attention deficit hyperactivity disorder (ADHD), predominantly inattentive type 07/16/2020   MDD (major depressive disorder), recurrent, in full remission 07/16/2020   BMI 29.0-29.9,adult 05/08/2020   Heart palpitations 05/08/2020   Need for hepatitis C screening test 05/08/2020   Migraine 12/29/2018   Anxiety and depression 12/06/2018   Anxiety 12/06/2018   Hemorrhoids 12/08/2015   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   Recurrent major depressive disorder 08/11/2015    PCP: Raguel Blush, MD  REFERRING PROVIDER: Jerona Sage, MD  REFERRING DIAG: Diagnosis M54.41,G89.29 (ICD-10-CM) - Chronic right-sided  low back pain with right-sided sciatica  Rationale for Evaluation and Treatment: Rehabilitation  THERAPY DIAG:  Other low back pain  Sciatica, right side  Muscle weakness (generalized)  Other abnormalities of gait and mobility  ONSET DATE: chronic (1.5 years ago)   SUBJECTIVE:                                                                                                                                                                                           SUBJECTIVE STATEMENT: Feels pain deeper in her back, but the hip and leg are improving.     PERTINENT  HISTORY:  Patient endorses that her pain is more in her Rt hip (posterior and lateral) with intermittent low back pain. Patient states that it started after sitting in the floor one time and has consistently gotten worse over last 1.5 years. Patient endorses that exercise and massage used to help, but now it is short term relief and pain has become more consistent. Patient did fracture Rt side of pelvis 14 years ago but has not had any residual deficits since. Patient has used prednisone  in the past and recently finished a round that assisted with pain short term. Patient intermittently experiencing sciatica-like n/t down Rt leg and into Rt foot.   See PMH and personal factors for in depth comorbidities   PAIN:  Are you having pain? Yes: NPRS scale: 1/10 today  Pain location: Rt hip (lateral) Pain description: sharp ache  Aggravating factors: sitting on the floor, sleeping on that side, standing for prolonged periods, walking long distances  Relieving factors: massage   PRECAUTIONS: None  RED FLAGS: Bowel or bladder incontinence: No and Cauda equina syndrome: No   WEIGHT BEARING RESTRICTIONS: No  FALLS:  Has patient fallen in last 6 months? 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  DIAGNOSTIC FINDINGS:  2 view radiographs of the lumbar spine shows normal lordosis without  periarticular bony spurs.  There were no pars defect no spondylolisthesis.   PATIENT SURVEYS:  PSFS: THE PATIENT SPECIFIC FUNCTIONAL SCALE  Place score of 0-10 (0 = unable to perform activity and 10 = able to perform activity at the same level as before injury or problem)  Activity Date: 09/25/2024 10/15/24    Exercise without pain   5 7   2. Walking (everyday activity walking)  7 7   3. Sleep through the night   5 8   4.      Total Score 5.67 7.3     Total Score = Sum of activity scores/number of activities  Minimally Detectable Change: 3 points (for single activity); 2 points (for average score)  [facility] (nd). The Patient Specific Functional Scale . Retrieved from [email or web address]   COGNITION: Overall cognitive status: Within functional limits for tasks assessed     SENSATION: Light touch: WFL  MUSCLE LENGTH:   POSTURE: rounded shoulders and increased thoracic kyphosis  PALPATION: Highly TTP in Rt piriformis and Rt glutes ; not TTP around Rt greater trochanter or on Lt side   LUMBAR ROM:   AROM Eval 09/25/2024 10/15/24  Flexion WFL 65  Extension WFL , Rt SI pain  20 with mild discomfort  Right lateral flexion WFL 56  Left lateral flexion WFL 54  Right rotation [facility] WFL   Left rotation [facility] WFL, mild discomfort down left flank   (Blank rows = not tested)  LOWER EXTREMITY ROM:     Passive  Right Eval 09/25/2024 Left Eval 09/25/2024  Hip flexion [facility]  Hip extension    Hip abduction    Hip adduction    Hip internal rotation [facility] WFL   Hip external rotation WFL, painful WFL  Knee flexion    Knee extension  Ankle dorsiflexion    Ankle plantarflexion    Ankle inversion    Ankle eversion     (Blank rows = not tested)  LOWER EXTREMITY MMT:    MMT Right Eval 09/25/2024 Left Eval 09/25/2024  Hip flexion (seated) 5/5 5/5  Hip extension (prone) 4/5 4/5, painful in Rt piriformis  Hip abduction (sidelying) 4+/5, painful 5/5  Hip adduction    Hip internal rotation    Hip external rotation    Knee flexion (seated) 5/5 5/5  Knee extension (seated) 5/5 5/5  Ankle dorsiflexion (seated) 5/5 5/5  Ankle plantarflexion    Ankle inversion    Ankle eversion     (Blank rows = not tested)   GAIT: Distance walked: not objectively measured  Assistive device utilized: None Level of assistance: supervision  Comments: no overt gait deviations noted   TREATMENT DATE:  10/22/24 Manual:  STM to Rt glutes, piriformis, hamstrings and quads; skilled palpation and monitoring of soft tissue during DN CPA Gr 1 mobs L 4-5  Trigger Point Dry Needling  Subsequent Treatment: Instructions provided previously at initial dry needling treatment.   Patient Verbal Consent Given: Yes Education Handout Provided: Yes Muscles Treated: Rt piriformis, Rt piriformis, bil L4/5 multifidi, Rt hamstring, Rt distal quad  Electrical Stimulation Performed: No Treatment Response/Outcome: improved tenderness with palpation, twitch responses noted  10/15/24 TherEx:  Seated glute stretch x 3 bil LE holding 30 sec Seated piriformis stretch x 3 holding 30 sec Discussed seated hamstring stretch for home after needling TherActivites:  Sit to stand x 12 c 10# kettle bell  Double leg press: 75# 2 x 15 slow eccentrics Rows: blue TB x 15 holding 3 sec Manual:  STM to Rt piriformis and Rt hamstrings  Trigger Point Dry Needling:  Initial Treatment: Pt instructed on Dry Needling rational, procedures, and possible side effects. Pt instructed to expect  mild to moderate muscle soreness later in the day and/or into the next day.  Pt instructed in methods to reduce muscle soreness. Pt instructed to continue prescribed HEP. Patient verbalized understanding of these instructions and education.   Patient Verbal Consent Given: Yes Education Handout Provided: Yes Muscles Treated: Rt piriformis, Rt hamstrings Electrical Stimulation Performed: No Treatment Response/Outcome: improved tenderness with palpation   10/08/2024 TherEx:  Supine: bridge 2 x 10 holding 5 sec Side lying: hip circles both directions 2 x 10  Side lying reverse clams: 2 x 10  Abdominal contractions sitting with UE pressing on red physio- ball 2 x 10 holding 5 sec Self Care:  we discussed pelvic floor exercises and kegals Manual:  STM to Rt glute medius and Rt lateral hip TherActivites:  Sit to stand x 2 c ball squeeze and abdominal  Opposite arm to knee on green physio-ball 2 x 20  Trigger Point Dry Needling:  Initial Treatment: Pt instructed on Dry Needling rational, procedures, and possible side effects. Pt instructed to expect mild to moderate muscle soreness later in the day and/or into the next day.  Pt instructed in methods to reduce muscle soreness. Pt instructed to continue prescribed HEP. Patient verbalized understanding of these instructions and education.   Patient Verbal Consent Given: Yes Education Handout Provided: Yes Muscles Treated: Rt glute medius and Rt lateral hip Electrical Stimulation Performed: No Treatment Response/Outcome: improved tenderness with palpation   09/25/2024 TherEx:  HEP handout provided with patient performing on set of each activity for appropriate form. Verbal and tactile cues provided.   Self-Care:  POC, dry needling, long-term effects  of previous fractures on pain and strength/flexibility     Manual:  STM to Rt piriformis and Rt glutes  PT provided tennis ball to go with HEP for self-STM to Rt glutes/piriformis    Dry Needling:    Trigger Point Dry Needling  Initial Treatment: Pt instructed on Dry Needling rational, procedures, and possible side effects. Pt instructed to expect mild to moderate muscle soreness later in the day and/or into the next day.  Pt instructed in methods to reduce muscle soreness. Pt instructed to continue prescribed HEP. Patient verbalized understanding of these instructions and education.   Patient Verbal Consent Given: Yes Education Handout Provided: Yes Muscles Treated: Rt piriformis, Rt glute max  Electrical Stimulation Performed: No Treatment Response/Outcome: improved tenderness with palpation,                                                                                                                               PATIENT EDUCATION:  Education details: HEP, POC, dry needling Person educated: Patient Education method: Explanation, Demonstration, Tactile cues, Verbal cues, and Handouts Education comprehension: verbalized understanding, returned demonstration, verbal cues required, and tactile cues required  HOME EXERCISE PROGRAM: Access Code: [email or web address] URL: [email or web address] Date: 09/25/2024 Prepared by: Susannah Daring  Exercises - Seated Figure 4 Piriformis Stretch  - 1 x daily - 7 x weekly - 2 sets - 30sec hold - Seated Piriformis Stretch  - 1 x daily - 7 x weekly - 2 sets - 30sec hold - Seated Hamstring Stretch  - 1 x daily - 7 x weekly - 2 sets - 30sec hold - Standing Piriformis Release with Ball at Wall  - 7 x weekly - 2 sets - 60sec hold  ASSESSMENT:  CLINICAL IMPRESSION: Pt with positive response to DN today and reporting centralization of symptoms at this time.  Continue skilled PT, may be ready to hold or d/c next 1-2 visits.    OBJECTIVE IMPAIRMENTS: decreased activity tolerance, decreased mobility, difficulty walking, improper body mechanics, postural dysfunction, and pain.   ACTIVITY LIMITATIONS: sitting, standing, and  sleeping  PARTICIPATION LIMITATIONS: community activity and occupation  PERSONAL FACTORS: Time since onset of injury/illness/exacerbation and 1-2 comorbidities: Anemia, GERD are also affecting patient's functional outcome.   REHAB POTENTIAL: Excellent  CLINICAL DECISION MAKING: Stable/uncomplicated  EVALUATION COMPLEXITY: Low   GOALS: Goals reviewed with patient? Yes  SHORT TERM GOALS: Target date: 10/16/2024  Patient will show compliance with initial HEP. Baseline: Goal status: MET 10/15/24  2.  Patient will report pain levels no greater than 5/10 in order to show an improved overall quality of life. Baseline:  Goal status: MET 10/15/24 most days   LONG TERM GOALS: Target date: 11/06/2024  Patient will be independent with final HEP in order to maintain and progress upon functional gains made within PT. Baseline:  Goal status: INITIAL  2.  Patient will report pain levels no greater than 3/10 in order to show an improved overall quality of  life. Baseline:  Goal status: INITIAL  3.  Patient will increase PSFS to at least 7.67 in order to show a significant improvement in subjective disability rating. Baseline:  Goal status: on-going 10/15/24 (7.3)  4.  Patient will report ability to walk more than a mile without increasing pain above baseline. Baseline:  Goal status: INITIAL    PLAN:  PT FREQUENCY: 1x/week  PT DURATION: 6 weeks  PLANNED INTERVENTIONS: [id number]- PT Re-evaluation, 97750- Physical Performance Testing, 97110-Therapeutic exercises, 97530- Therapeutic activity, [id number]- Neuromuscular re-education, 97535- Self Care, [id number]- Manual therapy, [id number]- Gait training, [phone]- Orthotic Initial, [phone]- Orthotic/Prosthetic subsequent, [phone]- Canalith repositioning, [phone]- Aquatic Therapy, [phone]- Electrical stimulation (unattended), [phone]- Electrical stimulation (manual), [id number]- Vasopneumatic device, [id number]- Ultrasound, [id number]- Traction (mechanical), [id number]- Ionotophoresis  4mg /ml Dexamethasone , [id number] (1-2 muscles), [id number] (3+ muscles)- Dry Needling, Patient/Family education, Balance training, Stair training, Taping, Joint mobilization, Joint manipulation, Spinal manipulation, Spinal mobilization, Vestibular training, DME instructions, Cryotherapy, and Moist heat.  PLAN FOR NEXT SESSION: may need to schedule more visits,  DN as needed, manual, flexibility, generalized core strengthening  Response to DN again    Corean JULIANNA Ku, PT, DPT 10/22/2024 8:42 AM'   "

## 2024-10-29 ENCOUNTER — Ambulatory Visit: Admitting: Physical Therapy

## 2024-10-29 ENCOUNTER — Encounter: Payer: Self-pay | Admitting: Physical Therapy

## 2024-10-29 DIAGNOSIS — M5431 Sciatica, right side: Secondary | ICD-10-CM

## 2024-10-29 DIAGNOSIS — M5459 Other low back pain: Secondary | ICD-10-CM

## 2024-10-29 DIAGNOSIS — M6281 Muscle weakness (generalized): Secondary | ICD-10-CM

## 2024-10-29 NOTE — Therapy (Signed)
 " OUTPATIENT PHYSICAL THERAPY THORACOLUMBAR   Patient Name: Michele Houston MRN: [id number] DOB:September 09, 1986, 38 y.o., female Today's Date: 10/29/2024  END OF SESSION:  PT End of Session - 10/29/24 0823     Visit Number 5    Number of Visits 6    Date for Recertification  11/06/24    Authorization Type [place] [employer] $25 COPAY    Progress Note Due on Visit 6    PT Start Time 0804    PT Stop Time 0844    PT Time Calculation (min) 40 min    Activity Tolerance Patient tolerated treatment well    Behavior During Therapy [facility] for tasks assessed/performed              Past Medical History:  Diagnosis Date   Anemia    when pregnant only   GERD (gastroesophageal reflux disease)    Past Surgical History:  Procedure Laterality Date   CHOLECYSTECTOMY N/A 06/03/2015   Procedure: LAPAROSCOPIC CHOLECYSTECTOMY WITH INTRAOPERATIVE CHOLANGIOGRAM;  Surgeon: Reyes LELON Cota, MD;  Location: [facility] ORS;  Service: General;  Laterality: N/A;   KNEE SURGERY Left 2011   arthroscopy   tubes in ears  baby   Patient Active Problem List   Diagnosis Date Noted   Carpal tunnel syndrome of right wrist 06/13/2023   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   Panic disorder 09/22/2020   URI with cough and congestion 09/12/2020   Acute non-recurrent sinusitis 09/12/2020   GAD (generalized anxiety disorder) 07/16/2020   Attention deficit hyperactivity disorder (ADHD), predominantly inattentive type 07/16/2020   MDD (major depressive disorder), recurrent, in full remission 07/16/2020   BMI 29.0-29.9,adult 05/08/2020   Heart palpitations 05/08/2020   Need for hepatitis C screening test 05/08/2020   Migraine 12/29/2018   Anxiety and depression 12/06/2018   Anxiety 12/06/2018   Hemorrhoids 12/08/2015   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   Recurrent major depressive disorder 08/11/2015    PCP: Raguel Blush, MD  REFERRING PROVIDER: Jerona Sage, MD  REFERRING DIAG: Diagnosis M54.41,G89.29 (ICD-10-CM) - Chronic  right-sided low back pain with right-sided sciatica  Rationale for Evaluation and Treatment: Rehabilitation  THERAPY DIAG:  Other low back pain  Sciatica, right side  Muscle weakness (generalized)  ONSET DATE: chronic (1.5 years ago)   SUBJECTIVE:                                                                                                                                                                                           SUBJECTIVE STATEMENT: Pt feels pain is more musculature and DN seems to help, but her pain returns.    PERTINENT HISTORY:  Patient endorses that  her pain is more in her Rt hip (posterior and lateral) with intermittent low back pain. Patient states that it started after sitting in the floor one time and has consistently gotten worse over last 1.5 years. Patient endorses that exercise and massage used to help, but now it is short term relief and pain has become more consistent. Patient did fracture Rt side of pelvis 14 years ago but has not had any residual deficits since. Patient has used prednisone  in the past and recently finished a round that assisted with pain short term. Patient intermittently experiencing sciatica-like n/t down Rt leg and into Rt foot.   See PMH and personal factors for in depth comorbidities   PAIN:  Are you having pain? Yes: NPRS scale: 1-2/10 today  Pain location: Rt hip (lateral) Pain description: sharp ache  Aggravating factors: sitting on the floor, sleeping on that side, standing for prolonged periods, walking long distances  Relieving factors: massage   PRECAUTIONS: None  RED FLAGS: Bowel or bladder incontinence: No and Cauda equina syndrome: No   WEIGHT BEARING RESTRICTIONS: No  FALLS:  Has patient fallen in last 6 months? 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  DIAGNOSTIC FINDINGS:  2 view radiographs of the lumbar spine shows normal lordosis without  periarticular bony spurs.  There were no pars defect no spondylolisthesis.   PATIENT SURVEYS:  PSFS: THE PATIENT SPECIFIC FUNCTIONAL SCALE  Place score of 0-10 (0 = unable to perform activity and 10 = able to perform activity at the same level as before injury or problem)  Activity Date: 09/25/2024 10/15/24    Exercise without pain   5 7   2. Walking (everyday activity walking)  7 7   3. Sleep through the night   5 8   4.      Total Score 5.67 7.3     Total Score = Sum of activity scores/number of activities  Minimally Detectable Change: 3 points (for single activity); 2 points (for average score)  [facility] (nd). The Patient Specific Functional Scale . Retrieved from [email or web address]   COGNITION: Overall cognitive status: Within functional limits for tasks assessed     SENSATION: Light touch: WFL  MUSCLE LENGTH:   POSTURE: rounded shoulders and increased thoracic kyphosis  PALPATION: Highly TTP in Rt piriformis and Rt glutes ; not TTP around Rt greater trochanter or on Lt side   LUMBAR ROM:   AROM Eval 09/25/2024 10/15/24  Flexion WFL 65  Extension WFL , Rt SI pain  20 with mild discomfort  Right lateral flexion WFL 56  Left lateral flexion WFL 54  Right rotation [facility] WFL   Left rotation [facility] WFL, mild discomfort down left flank   (Blank rows = not tested)  LOWER EXTREMITY ROM:     Passive  Right Eval 09/25/2024 Left Eval 09/25/2024  Hip flexion [facility]  Hip extension    Hip abduction    Hip adduction    Hip internal rotation [facility] WFL  Hip external rotation  WFL, painful WFL  Knee flexion    Knee extension    Ankle dorsiflexion  Ankle plantarflexion    Ankle inversion    Ankle eversion     (Blank rows = not tested)  LOWER EXTREMITY MMT:    MMT Right Eval 09/25/2024 Left Eval 09/25/2024  Hip flexion (seated) 5/5 5/5  Hip extension (prone) 4/5 4/5, painful in Rt piriformis  Hip abduction (sidelying) 4+/5, painful 5/5  Hip adduction    Hip internal rotation    Hip external rotation    Knee flexion (seated) 5/5 5/5  Knee extension (seated) 5/5 5/5  Ankle dorsiflexion (seated) 5/5 5/5  Ankle plantarflexion    Ankle inversion    Ankle eversion     (Blank rows = not tested)   GAIT: Distance walked: not objectively measured  Assistive device utilized: None Level of assistance: supervision  Comments: no overt gait deviations noted   TREATMENT DATE:  10/29/24 Manual:  STM to Rt glutes, piriformis, hamstrings and quads; skilled palpation and monitoring of soft tissue during DN  Trigger Point Dry Needling  Subsequent Treatment: Instructions provided previously at initial dry needling treatment.   Patient Verbal Consent Given: Yes Education Handout Provided: Yes Muscles Treated: Rt piriformis, Rt glute medius, Rt lumbar multifidi Electrical Stimulation Performed: No Treatment Response/Outcome: improved tenderness with palpation, twitch responses noted TherEx:  Standing hip hike x 15 bil  Child's pose TRX low back stretch x 3 holding 30 sec     10/22/24 Manual:  STM to Rt glutes, piriformis, hamstrings and quads; skilled palpation and monitoring of soft tissue during DN CPA Gr 1 mobs L 4-5  Trigger Point Dry Needling  Subsequent Treatment: Instructions provided previously at initial dry needling treatment.   Patient Verbal Consent Given: Yes Education Handout Provided: Yes Muscles Treated: Rt piriformis, Rt piriformis, bil L4/5 multifidi, Rt hamstring, Rt distal quad  Electrical Stimulation Performed:  No Treatment Response/Outcome: improved tenderness with palpation, twitch responses noted   10/15/24 TherEx:  Seated glute stretch x 3 bil LE holding 30 sec Seated piriformis stretch x 3 holding 30 sec Discussed seated hamstring stretch for home after needling TherActivites:  Sit to stand x 12 c 10# kettle bell  Double leg press: 75# 2 x 15 slow eccentrics Rows: blue TB x 15 holding 3 sec Manual:  STM to Rt piriformis and Rt hamstrings Trigger Point Dry Needling:  Initial Treatment: Pt instructed on Dry Needling rational, procedures, and possible side effects. Pt instructed to expect mild to moderate muscle soreness later in the day and/or into the next day.  Pt instructed in methods to reduce muscle soreness. Pt instructed to continue prescribed HEP. Patient verbalized understanding of these instructions and education.   Patient Verbal Consent Given: Yes Education Handout Provided: Yes Muscles Treated: Rt piriformis, Rt hamstrings Electrical Stimulation Performed: No Treatment Response/Outcome: improved tenderness with palpation                                                                                                                            PATIENT EDUCATION:  Education details: HEP, POC, dry needling Person educated: Patient Education method: Explanation, Demonstration, Tactile cues, Verbal cues, and Handouts Education comprehension: verbalized understanding, returned demonstration, verbal cues required, and tactile cues required  HOME EXERCISE PROGRAM: Access Code: [email or web address] URL: [email or web address] Date: 10/29/2024 Prepared by: Delon Lunger  Exercises - Seated Figure 4 Piriformis Stretch  - 1 x daily - 7 x weekly - 2 sets - 30sec hold - Seated Piriformis Stretch  - 1 x daily - 7 x weekly - 2 sets - 30sec hold - Seated Hamstring Stretch  - 1 x daily - 7 x weekly - 2 sets - 30sec hold - Standing Piriformis Release with Ball at Wall  - 7 x  weekly - 2 sets - 60sec hold - Sidelying Hip Circles  - 1 x daily - 7 x weekly - 3 sets - 10 reps - Standing Hip Hiking  - 2 x daily - 7 x weekly - 2 sets - 10-15 reps - Low Back Stretch with TRX  - 1 x daily - 7 x weekly - 3 reps - 20 seconds hold - Child's Pose Stretch  - 1 x daily - 7 x weekly - 3 reps - 20 seconds hold  ASSESSMENT:  CLINICAL IMPRESSION: Pt arriving today reporting 1-2/10 pain in her low back and glutes. Pt reporting DN seems to help the best, but her pain returns. Pt edu on strengthening, stretching and continued STM at home   OBJECTIVE IMPAIRMENTS: decreased activity tolerance, decreased mobility, difficulty walking, improper body mechanics, postural dysfunction, and pain.   ACTIVITY LIMITATIONS: sitting, standing, and sleeping  PARTICIPATION LIMITATIONS: community activity and occupation  PERSONAL FACTORS: Time since onset of injury/illness/exacerbation and 1-2 comorbidities: Anemia, GERD are also affecting patient's functional outcome.   REHAB POTENTIAL: Excellent  CLINICAL DECISION MAKING: Stable/uncomplicated  EVALUATION COMPLEXITY: Low   GOALS: Goals reviewed with patient? Yes  SHORT TERM GOALS: Target date: 10/16/2024  Patient will show compliance with initial HEP. Baseline: Goal status: MET 10/15/24  2.  Patient will report pain levels no greater than 5/10 in order to show an improved overall quality of life. Baseline:  Goal status: MET 10/15/24 most days   LONG TERM GOALS: Target date: 11/06/2024  Patient will be independent with final HEP in order to maintain and progress upon functional gains made within PT. Baseline:  Goal status: INITIAL  2.  Patient will report pain levels no greater than 3/10 in order to show an improved overall quality of life. Baseline:  Goal status: INITIAL  3.  Patient will increase PSFS to at least 7.67 in order to show a significant improvement in subjective disability rating. Baseline:  Goal status:  on-going 10/15/24 (7.3)  4.  Patient will report ability to walk more than a mile without increasing pain above baseline. Baseline:  Goal status: INITIAL    PLAN:  PT FREQUENCY: 1x/week  PT DURATION: 6 weeks  PLANNED INTERVENTIONS: [id number]- PT Re-evaluation, 97750- Physical Performance Testing, 97110-Therapeutic exercises, 97530- Therapeutic activity, [id number]- Neuromuscular re-education, 97535- Self Care, [id number]- Manual therapy, [id number]- Gait training, [phone]- Orthotic Initial, [phone]- Orthotic/Prosthetic subsequent, [phone]- Canalith repositioning, [phone]- Aquatic Therapy, [phone]- Electrical stimulation (unattended), [phone]- Electrical stimulation (manual), [id number]- Vasopneumatic device, [id number]- Ultrasound, [id number]- Traction (mechanical), [id number]- Ionotophoresis 4mg /ml Dexamethasone , 79439 (1-2 muscles), [id number] (3+ muscles)- Dry Needling, Patient/Family education, Balance training, Stair training, Taping, Joint mobilization, Joint manipulation, Spinal manipulation, Spinal mobilization, Vestibular training, DME instructions, Cryotherapy, and Moist heat.  PLAN FOR NEXT SESSION: may need to schedule more visits, DN as  needed, manual, flexibility, how did new exercises go and DN    Delon Lunger, PT, MPT 10/29/2024 9:56 AM   10/29/2024 9:56 AM'   "

## 2024-11-05 ENCOUNTER — Other Ambulatory Visit: Payer: Self-pay

## 2024-11-05 ENCOUNTER — Other Ambulatory Visit: Payer: Self-pay | Admitting: Family Medicine

## 2024-11-05 ENCOUNTER — Ambulatory Visit: Admitting: Physical Therapy

## 2024-11-05 ENCOUNTER — Encounter: Payer: Self-pay | Admitting: Physical Therapy

## 2024-11-05 DIAGNOSIS — M25551 Pain in right hip: Secondary | ICD-10-CM | POA: Diagnosis not present

## 2024-11-05 DIAGNOSIS — M5431 Sciatica, right side: Secondary | ICD-10-CM | POA: Diagnosis not present

## 2024-11-05 DIAGNOSIS — M5459 Other low back pain: Secondary | ICD-10-CM

## 2024-11-05 DIAGNOSIS — M6281 Muscle weakness (generalized): Secondary | ICD-10-CM

## 2024-11-05 DIAGNOSIS — R2689 Other abnormalities of gait and mobility: Secondary | ICD-10-CM

## 2024-11-05 MED ORDER — OMEPRAZOLE 20 MG PO CPDR
20.0000 mg | DELAYED_RELEASE_CAPSULE | Freq: Every day | ORAL | 0 refills | Status: AC
  Filled 2024-11-05: qty 90, 90d supply, fill #0

## 2024-11-05 NOTE — Therapy (Signed)
 " OUTPATIENT PHYSICAL THERAPY THORACOLUMBAR  Discharge  Patient Name: Michele Houston MRN: [id number] DOB:Feb 21, 1986, 39 y.o., female Today's Date: 11/05/2024  END OF SESSION:  PT End of Session - 11/05/24 0808     Visit Number 6    Number of Visits 6    Date for Recertification  11/06/24    Authorization Type [place] [employer] $25 COPAY    Progress Note Due on Visit 6    PT Start Time 0805    PT Stop Time 0845    PT Time Calculation (min) 40 min    Activity Tolerance Patient tolerated treatment well    Behavior During Therapy WFL for tasks assessed/performed              Past Medical History:  Diagnosis Date   Anemia    when pregnant only   GERD (gastroesophageal reflux disease)    Past Surgical History:  Procedure Laterality Date   CHOLECYSTECTOMY N/A 06/03/2015   Procedure: LAPAROSCOPIC CHOLECYSTECTOMY WITH INTRAOPERATIVE CHOLANGIOGRAM;  Surgeon: Reyes LELON Cota, MD;  Location: [facility] ORS;  Service: General;  Laterality: N/A;   KNEE SURGERY Left 2011   arthroscopy   tubes in ears  baby   Patient Active Problem List   Diagnosis Date Noted   Carpal tunnel syndrome of right wrist 06/13/2023   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   Panic disorder 09/22/2020   URI with cough and congestion 09/12/2020   Acute non-recurrent sinusitis 09/12/2020   GAD (generalized anxiety disorder) 07/16/2020   Attention deficit hyperactivity disorder (ADHD), predominantly inattentive type 07/16/2020   MDD (major depressive disorder), recurrent, in full remission 07/16/2020   BMI 29.0-29.9,adult 05/08/2020   Heart palpitations 05/08/2020   Need for hepatitis C screening test 05/08/2020   Migraine 12/29/2018   Anxiety and depression 12/06/2018   Anxiety 12/06/2018   Hemorrhoids 12/08/2015   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   Recurrent major depressive disorder 08/11/2015    PCP: Raguel Blush, MD  REFERRING PROVIDER: Jerona Sage, MD  REFERRING DIAG: Diagnosis M54.41,G89.29 (ICD-10-CM) - Chronic  right-sided low back pain with right-sided sciatica  Rationale for Evaluation and Treatment: Rehabilitation  THERAPY DIAG:  Other low back pain  Sciatica, right side  Muscle weakness (generalized)  Other abnormalities of gait and mobility  Pain in right hip  ONSET DATE: chronic (1.5 years ago)   SUBJECTIVE:                                                                                                                                                                                           SUBJECTIVE STATEMENT: Pt feels the DN from her last visit helped the most.  PERTINENT HISTORY:  Patient endorses that her pain is more in her Rt hip (posterior and lateral) with intermittent low back pain. Patient states that it started after sitting in the floor one time and has consistently gotten worse over last 1.5 years. Patient endorses that exercise and massage used to help, but now it is short term relief and pain has become more consistent. Patient did fracture Rt side of pelvis 14 years ago but has not had any residual deficits since. Patient has used prednisone  in the past and recently finished a round that assisted with pain short term. Patient intermittently experiencing sciatica-like n/t down Rt leg and into Rt foot.   See PMH and personal factors for in depth comorbidities   PAIN:  Are you having pain? Yes: NPRS scale: 1/10 today  Pain location: Rt hip (lateral) Pain description: sharp ache  Aggravating factors: sitting on the floor, sleeping on that side, standing for prolonged periods, walking long distances  Relieving factors: massage   PRECAUTIONS: None  RED FLAGS: Bowel or bladder incontinence: No and Cauda equina syndrome: No   WEIGHT BEARING RESTRICTIONS: No  FALLS:  Has patient fallen in last 6 months? 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  DIAGNOSTIC FINDINGS:  2 view radiographs of the lumbar spine shows normal lordosis without  periarticular bony spurs.  There were no pars defect no spondylolisthesis.   PATIENT SURVEYS:  PSFS: THE PATIENT SPECIFIC FUNCTIONAL SCALE  Place score of 0-10 (0 = unable to perform activity and 10 = able to perform activity at the same level as before injury or problem)  Activity Date: 09/25/2024 10/15/24  11/05/24  Exercise without pain   5 7 10   2. Walking (everyday activity walking)  7 7 9   3. Sleep through the night   5 8 9   4.      Total Score 5.67 7.3 9.3    Total Score = Sum of activity scores/number of activities  Minimally Detectable Change: 3 points (for single activity); 2 points (for average score)  [facility] (nd). The Patient Specific Functional Scale . Retrieved from [email or web address]   COGNITION: Overall cognitive status: Within functional limits for tasks assessed     SENSATION: Light touch: WFL  MUSCLE LENGTH:   POSTURE: rounded shoulders and increased thoracic kyphosis  PALPATION: Highly TTP in Rt piriformis and Rt glutes ; not TTP around Rt greater trochanter or on Lt side   LUMBAR ROM:   AROM Eval 09/25/2024 10/15/24 11/05/24  Flexion WFL 65 65  Extension WFL , Rt SI pain  20 with mild discomfort 42 No pain   Right lateral flexion WFL 56 56  Left lateral flexion WFL 54 55  Right rotation [facility]  WFL  Left rotation [facility] WFL, mild discomfort down left flank WFL No pain   (Blank rows = not tested)  LOWER EXTREMITY ROM:     Passive  Right Eval 09/25/2024 Left Eval 09/25/2024 Rt 11/05/24  Hip flexion [facility] WFL    Hip extension     Hip abduction     Hip adduction     Hip internal rotation [facility]  WFL   Hip external rotation WFL, painful [facility] WFL  No pain noted  Knee flexion     Knee extension     Ankle dorsiflexion     Ankle plantarflexion     Ankle inversion     Ankle eversion      (Blank rows = not tested)  LOWER EXTREMITY MMT:    MMT Right Eval 09/25/2024 Left Eval 09/25/2024 Rt / left 11/05/24  Hip flexion (seated) 5/5 5/5 5/5 / 5/5  Hip extension (prone) 4/5 4/5, painful in Rt piriformis 5/5 / 5/5  Hip abduction (sidelying) 4+/5, painful 5/5 5/5 / 5/5  Hip adduction     Hip internal rotation     Hip external rotation     Knee flexion (seated) 5/5 5/5   Knee extension (seated) 5/5 5/5   Ankle dorsiflexion (seated) 5/5 5/5   Ankle plantarflexion     Ankle inversion     Ankle eversion      (Blank rows = not tested)   GAIT: Distance walked: not objectively measured  Assistive device utilized: None Level of assistance: supervision  Comments: no overt gait deviations noted   TREATMENT DATE:  11/05/24 Manual:  STM to Rt glutes, piriformis Percussion device and active trigger point release of pt's left levator scapular and rhomboids skilled palpation and monitoring of soft tissue during DN Trigger Point Dry Needling  Subsequent Treatment: Instructions provided previously at initial dry needling treatment.   Patient Verbal Consent Given: Yes Education Handout Provided: Yes Muscles Treated: Rt piriformis, Rt glute medius Electrical Stimulation Performed: No Treatment Response/Outcome: improved tenderness with palpation, twitch responses noted TherEx:  Updated ROM and MMT     10/29/24 Manual:  STM to Rt glutes, piriformis, hamstrings and quads; skilled palpation and monitoring of soft tissue during DN  Trigger Point Dry Needling  Subsequent Treatment: Instructions provided previously at initial dry needling treatment.   Patient Verbal Consent Given: Yes Education  Handout Provided: Yes Muscles Treated: Rt piriformis, Rt glute medius, Rt lumbar multifidi Electrical Stimulation Performed: No Treatment Response/Outcome: improved tenderness with palpation, twitch responses noted TherEx:  Standing hip hike x 15 bil  Child's pose TRX low back stretch x 3 holding 30 sec     10/22/24 Manual:  STM to Rt glutes, piriformis, hamstrings and quads; skilled palpation and monitoring of soft tissue during DN CPA Gr 1 mobs L 4-5  Trigger Point Dry Needling  Subsequent Treatment: Instructions provided previously at initial dry needling treatment.   Patient Verbal Consent Given: Yes Education Handout Provided: Yes Muscles Treated: Rt piriformis, Rt piriformis, bil L4/5 multifidi, Rt hamstring, Rt distal quad  Electrical Stimulation Performed: No Treatment Response/Outcome: improved tenderness with palpation, twitch responses noted   10/15/24 TherEx:  Seated glute stretch x 3 bil LE holding 30 sec Seated piriformis stretch x 3 holding 30 sec Discussed seated hamstring stretch for home after needling TherActivites:  Sit to stand x 12 c 10# kettle bell  Double leg press: 75# 2 x 15 slow eccentrics Rows: blue TB x 15 holding 3 sec Manual:  STM to Rt piriformis and Rt hamstrings Trigger Point Dry Needling:  Initial Treatment: Pt instructed on Dry Needling rational, procedures, and possible side effects. Pt instructed to expect mild to moderate muscle soreness later in the day and/or into the next day.  Pt instructed in methods to reduce muscle soreness. Pt instructed to continue prescribed HEP. Patient verbalized understanding of these instructions and education.   Patient Verbal Consent Given: Yes  Education Handout Provided: Yes Muscles Treated: Rt piriformis, Rt hamstrings Electrical Stimulation Performed: No Treatment Response/Outcome: improved tenderness with palpation                                                                                                                             PATIENT EDUCATION:  Education details: HEP, POC, dry needling Person educated: Patient Education method: Explanation, Demonstration, Tactile cues, Verbal cues, and Handouts Education comprehension: verbalized understanding, returned demonstration, verbal cues required, and tactile cues required  HOME EXERCISE PROGRAM: Access Code: [email or web address] URL: [email or web address] Date: 10/29/2024 Prepared by: Delon Lunger  Exercises - Seated Figure 4 Piriformis Stretch  - 1 x daily - 7 x weekly - 2 sets - 30sec hold - Seated Piriformis Stretch  - 1 x daily - 7 x weekly - 2 sets - 30sec hold - Seated Hamstring Stretch  - 1 x daily - 7 x weekly - 2 sets - 30sec hold - Standing Piriformis Release with Ball at Wall  - 7 x weekly - 2 sets - 60sec hold - Sidelying Hip Circles  - 1 x daily - 7 x weekly - 3 sets - 10 reps - Standing Hip Hiking  - 2 x daily - 7 x weekly - 2 sets - 10-15 reps - Low Back Stretch with TRX  - 1 x daily - 7 x weekly - 3 reps - 20 seconds hold - Child's Pose Stretch  - 1 x daily - 7 x weekly - 3 reps - 20 seconds hold  ASSESSMENT:  CLINICAL IMPRESSION: Pt has met all of her goals set at her initial evaluation and is ready to discharge skilled PT. Pt has an updated HEP as well as she reports she is joining a gym and wishing to begin aquatic exercises. Pt also reporting she has appointments with massage therapist lined up. I'm discharging pt from skilled PT.    OBJECTIVE IMPAIRMENTS: decreased activity tolerance, decreased mobility, difficulty walking, improper body mechanics, postural dysfunction, and pain.   ACTIVITY LIMITATIONS: sitting, standing, and sleeping  PARTICIPATION LIMITATIONS: community activity and occupation  PERSONAL FACTORS: Time since onset of injury/illness/exacerbation and 1-2 comorbidities: Anemia, GERD are also affecting patient's functional outcome.   REHAB POTENTIAL: Excellent  CLINICAL  DECISION MAKING: Stable/uncomplicated  EVALUATION COMPLEXITY: Low   GOALS: Goals reviewed with patient? Yes  SHORT TERM GOALS: Target date: 10/16/2024  Patient will show compliance with initial HEP. Baseline: Goal status: MET 10/15/24  2.  Patient will report pain levels no greater than 5/10 in order to show an improved overall quality of life. Baseline:  Goal status: MET 10/15/24 most days   LONG TERM GOALS: Target date: 11/06/2024  Patient will be independent with final HEP in order to maintain and progress upon functional gains made within PT. Baseline:  Goal status: MET 11/05/24  2.  Patient will report pain levels no greater than 3/10 in order to show an improved overall quality of life. Baseline:  Goal status: MET 11/05/24  3.  Patient will increase PSFS to at least 7.67 in order to show a significant improvement in subjective disability rating. Baseline:  Goal status: MET 11/05/24  4.  Patient will report ability to walk more than a mile without increasing pain above baseline. Baseline:  Goal status: MET 11/05/24    PLAN:  PT FREQUENCY: 1x/week  PT DURATION: 6 weeks  PLANNED INTERVENTIONS: 97164- PT Re-evaluation, 97750- Physical Performance Testing, 97110-Therapeutic exercises, 97530- Therapeutic activity, [id number]- Neuromuscular re-education, 97535- Self Care, [id number]- Manual therapy, [id number]- Gait training, [phone]- Orthotic Initial, [phone]- Orthotic/Prosthetic subsequent, [phone]- Canalith repositioning, [phone]- Aquatic Therapy, [phone]- Electrical stimulation (unattended), [phone]- Electrical stimulation (manual), [id number]- Vasopneumatic device, [id number]- Ultrasound, [id number]- Traction (mechanical), [id number]- Ionotophoresis 4mg /ml Dexamethasone , [id number] (1-2 muscles), [id number] (3+ muscles)- Dry Needling, Patient/Family education, Balance training, Stair training, Taping, Joint mobilization, Joint manipulation, Spinal manipulation, Spinal mobilization, Vestibular training, DME instructions, Cryotherapy,  and Moist heat.  PLAN FOR NEXT SESSION: discharge this visit    Delon Lunger, PT, MPT 11/05/2024 2:04 PM   11/05/2024 2:04 PM' PHYSICAL THERAPY DISCHARGE SUMMARY  Visits from Start of Care: 6  Current functional level related to goals / functional outcomes: See above   Remaining deficits: See above   Education / Equipment: HEP   Patient agrees to discharge. Patient goals were met. Patient is being discharged due to meeting the stated rehab goals.   "

## 2024-11-15 ENCOUNTER — Encounter: Payer: Self-pay | Admitting: Physical Therapy

## 2024-11-15 ENCOUNTER — Encounter: Payer: Self-pay | Admitting: Orthopedic Surgery

## 2024-11-15 DIAGNOSIS — G8929 Other chronic pain: Secondary | ICD-10-CM

## 2024-11-21 ENCOUNTER — Telehealth: Admitting: Physician Assistant

## 2024-11-21 ENCOUNTER — Other Ambulatory Visit: Payer: Self-pay

## 2024-11-21 ENCOUNTER — Encounter: Payer: Self-pay | Admitting: Certified Nurse Midwife

## 2024-11-21 DIAGNOSIS — B9689 Other specified bacterial agents as the cause of diseases classified elsewhere: Secondary | ICD-10-CM

## 2024-11-21 MED ORDER — AMOXICILLIN-POT CLAVULANATE 875-125 MG PO TABS
1.0000 | ORAL_TABLET | Freq: Two times a day (BID) | ORAL | 0 refills | Status: AC
  Filled 2024-11-21: qty 14, 7d supply, fill #0

## 2024-11-21 NOTE — Progress Notes (Unsigned)

## 2024-11-26 ENCOUNTER — Other Ambulatory Visit: Payer: Self-pay

## 2024-11-28 ENCOUNTER — Telehealth: Payer: Self-pay | Admitting: Physical Medicine and Rehabilitation

## 2024-12-05 ENCOUNTER — Other Ambulatory Visit: Payer: Self-pay

## 2024-12-05 ENCOUNTER — Other Ambulatory Visit (HOSPITAL_COMMUNITY): Payer: Self-pay

## 2024-12-10 ENCOUNTER — Ambulatory Visit: Admitting: Physical Therapy
